# Patient Record
Sex: Female | Born: 1980 | Race: White | Hispanic: No | Marital: Single | Smoking: Never smoker
Health system: Southern US, Community
[De-identification: ages and names within clinical notes are randomized; demographics above are authoritative.]

## PROBLEM LIST (undated history)

## (undated) DIAGNOSIS — N926 Irregular menstruation, unspecified: Secondary | ICD-10-CM

## (undated) DIAGNOSIS — E1110 Type 2 diabetes mellitus with ketoacidosis without coma: Secondary | ICD-10-CM

## (undated) DIAGNOSIS — E109 Type 1 diabetes mellitus without complications: Secondary | ICD-10-CM

## (undated) DIAGNOSIS — E039 Hypothyroidism, unspecified: Secondary | ICD-10-CM

## (undated) DIAGNOSIS — G2581 Restless legs syndrome: Secondary | ICD-10-CM

## (undated) DIAGNOSIS — F419 Anxiety disorder, unspecified: Secondary | ICD-10-CM

## (undated) DIAGNOSIS — E1140 Type 2 diabetes mellitus with diabetic neuropathy, unspecified: Secondary | ICD-10-CM

## (undated) DIAGNOSIS — Z87898 Personal history of other specified conditions: Secondary | ICD-10-CM

## (undated) DIAGNOSIS — E271 Primary adrenocortical insufficiency: Secondary | ICD-10-CM

## (undated) DIAGNOSIS — R2232 Localized swelling, mass and lump, left upper limb: Secondary | ICD-10-CM

## (undated) DIAGNOSIS — R12 Heartburn: Secondary | ICD-10-CM

## (undated) HISTORY — PX: WISDOM TOOTH EXTRACTION: SHX21

---

## 1999-08-20 ENCOUNTER — Inpatient Hospital Stay (HOSPITAL_COMMUNITY): Admission: EM | Admit: 1999-08-20 | Discharge: 1999-08-22 | Payer: Self-pay | Admitting: Emergency Medicine

## 1999-09-06 ENCOUNTER — Encounter: Admission: RE | Admit: 1999-09-06 | Discharge: 1999-12-05 | Payer: Self-pay | Admitting: *Deleted

## 2002-04-21 ENCOUNTER — Encounter: Admission: RE | Admit: 2002-04-21 | Discharge: 2002-05-18 | Payer: Self-pay | Admitting: Internal Medicine

## 2002-11-24 ENCOUNTER — Inpatient Hospital Stay (HOSPITAL_COMMUNITY): Admission: EM | Admit: 2002-11-24 | Discharge: 2002-11-27 | Payer: Self-pay | Admitting: Emergency Medicine

## 2002-12-01 ENCOUNTER — Encounter: Admission: RE | Admit: 2002-12-01 | Discharge: 2002-12-01 | Payer: Self-pay | Admitting: Family Medicine

## 2003-01-17 ENCOUNTER — Encounter: Admission: RE | Admit: 2003-01-17 | Discharge: 2003-04-17 | Payer: Self-pay | Admitting: Internal Medicine

## 2003-11-18 ENCOUNTER — Other Ambulatory Visit: Admission: RE | Admit: 2003-11-18 | Discharge: 2003-11-18 | Payer: Self-pay | Admitting: Obstetrics and Gynecology

## 2005-04-01 ENCOUNTER — Emergency Department (HOSPITAL_COMMUNITY): Admission: EM | Admit: 2005-04-01 | Discharge: 2005-04-01 | Payer: Self-pay | Admitting: Emergency Medicine

## 2005-09-23 ENCOUNTER — Inpatient Hospital Stay (HOSPITAL_COMMUNITY): Admission: EM | Admit: 2005-09-23 | Discharge: 2005-09-26 | Payer: Self-pay | Admitting: Emergency Medicine

## 2005-11-04 ENCOUNTER — Other Ambulatory Visit: Admission: RE | Admit: 2005-11-04 | Discharge: 2005-11-04 | Payer: Self-pay | Admitting: Obstetrics and Gynecology

## 2005-11-20 ENCOUNTER — Observation Stay (HOSPITAL_COMMUNITY): Admission: EM | Admit: 2005-11-20 | Discharge: 2005-11-23 | Payer: Self-pay | Admitting: Emergency Medicine

## 2005-11-20 ENCOUNTER — Ambulatory Visit: Payer: Self-pay | Admitting: Pulmonary Disease

## 2005-12-24 ENCOUNTER — Encounter: Admission: RE | Admit: 2005-12-24 | Discharge: 2005-12-24 | Payer: Self-pay | Admitting: Family Medicine

## 2006-05-18 ENCOUNTER — Inpatient Hospital Stay (HOSPITAL_COMMUNITY): Admission: EM | Admit: 2006-05-18 | Discharge: 2006-05-20 | Payer: Self-pay | Admitting: Emergency Medicine

## 2006-09-05 ENCOUNTER — Inpatient Hospital Stay (HOSPITAL_COMMUNITY): Admission: EM | Admit: 2006-09-05 | Discharge: 2006-09-08 | Payer: Self-pay | Admitting: Emergency Medicine

## 2007-02-04 ENCOUNTER — Other Ambulatory Visit: Admission: RE | Admit: 2007-02-04 | Discharge: 2007-02-04 | Payer: Self-pay | Admitting: Obstetrics and Gynecology

## 2007-09-28 ENCOUNTER — Emergency Department (HOSPITAL_COMMUNITY): Admission: EM | Admit: 2007-09-28 | Discharge: 2007-09-28 | Payer: Self-pay | Admitting: Emergency Medicine

## 2008-12-14 ENCOUNTER — Emergency Department (HOSPITAL_COMMUNITY): Admission: EM | Admit: 2008-12-14 | Discharge: 2008-12-14 | Payer: Self-pay | Admitting: Emergency Medicine

## 2009-02-14 ENCOUNTER — Other Ambulatory Visit: Admission: RE | Admit: 2009-02-14 | Discharge: 2009-02-14 | Payer: Self-pay | Admitting: Obstetrics and Gynecology

## 2010-01-02 ENCOUNTER — Inpatient Hospital Stay (HOSPITAL_COMMUNITY): Admission: EM | Admit: 2010-01-02 | Discharge: 2010-01-04 | Payer: Self-pay | Admitting: Emergency Medicine

## 2010-01-27 ENCOUNTER — Inpatient Hospital Stay (HOSPITAL_COMMUNITY): Admission: EM | Admit: 2010-01-27 | Discharge: 2010-01-28 | Payer: Self-pay | Admitting: Emergency Medicine

## 2010-06-04 ENCOUNTER — Inpatient Hospital Stay (HOSPITAL_COMMUNITY): Admission: EM | Admit: 2010-06-04 | Discharge: 2010-06-06 | Payer: Self-pay | Admitting: Emergency Medicine

## 2010-10-16 LAB — BASIC METABOLIC PANEL
BUN: 9 mg/dL (ref 6–23)
CO2: 15 mEq/L — ABNORMAL LOW (ref 19–32)
CO2: 17 mEq/L — ABNORMAL LOW (ref 19–32)
CO2: 19 mEq/L (ref 19–32)
Calcium: 8.3 mg/dL — ABNORMAL LOW (ref 8.4–10.5)
Calcium: 8.5 mg/dL (ref 8.4–10.5)
Chloride: 105 mEq/L (ref 96–112)
Chloride: 106 mEq/L (ref 96–112)
Chloride: 109 mEq/L (ref 96–112)
Chloride: 110 mEq/L (ref 96–112)
Chloride: 112 mEq/L (ref 96–112)
Chloride: 112 mEq/L (ref 96–112)
Creatinine, Ser: 0.49 mg/dL (ref 0.4–1.2)
Creatinine, Ser: 0.51 mg/dL (ref 0.4–1.2)
Creatinine, Ser: 0.55 mg/dL (ref 0.4–1.2)
Creatinine, Ser: 0.64 mg/dL (ref 0.4–1.2)
GFR calc Af Amer: >60 mL/min (ref >60)
GFR calc Af Amer: >60 mL/min (ref >60)
GFR calc Af Amer: >60 mL/min (ref >60)
GFR calc Af Amer: >60 mL/min (ref >60)
GFR calc non Af Amer: >60 mL/min (ref >60)
GFR calc non Af Amer: >60 mL/min (ref >60)
GFR calc non Af Amer: >60 mL/min (ref >60)
GFR calc non Af Amer: >60 mL/min (ref >60)
Glucose, Bld: 141 mg/dL — ABNORMAL HIGH (ref 70–99)
Glucose, Bld: 277 mg/dL — ABNORMAL HIGH (ref 70–99)
Potassium: 3.2 mEq/L — ABNORMAL LOW (ref 3.5–5.1)
Potassium: 3.6 mEq/L (ref 3.5–5.1)
Sodium: 132 mEq/L — ABNORMAL LOW (ref 135–145)
Sodium: 135 mEq/L (ref 135–145)
Sodium: 136 mEq/L (ref 135–145)
Sodium: 136 mEq/L (ref 135–145)

## 2010-10-16 LAB — CBC
MCH: 32.4 pg (ref 26.0–34.0)
MCHC: 35 g/dL (ref 30.0–36.0)
MCV: 92.4 fL (ref 78.0–100.0)
Platelets: 283 10*3/uL (ref 150–400)
RDW: 12.3 % (ref 11.5–15.5)
WBC: 5.8 10*3/uL (ref 4.0–10.5)

## 2010-10-16 LAB — GLUCOSE, CAPILLARY
Glucose-Capillary: 130 mg/dL — ABNORMAL HIGH (ref 70–99)
Glucose-Capillary: 165 mg/dL — ABNORMAL HIGH (ref 70–99)
Glucose-Capillary: 189 mg/dL — ABNORMAL HIGH (ref 70–99)
Glucose-Capillary: 194 mg/dL — ABNORMAL HIGH (ref 70–99)
Glucose-Capillary: 227 mg/dL — ABNORMAL HIGH (ref 70–99)
Glucose-Capillary: 233 mg/dL — ABNORMAL HIGH (ref 70–99)
Glucose-Capillary: 236 mg/dL — ABNORMAL HIGH (ref 70–99)
Glucose-Capillary: 241 mg/dL — ABNORMAL HIGH (ref 70–99)
Glucose-Capillary: 370 mg/dL — ABNORMAL HIGH (ref 70–99)
Glucose-Capillary: 384 mg/dL — ABNORMAL HIGH (ref 70–99)
Glucose-Capillary: 428 mg/dL — ABNORMAL HIGH (ref 70–99)

## 2010-10-17 LAB — GLUCOSE, CAPILLARY
Glucose-Capillary: 140 mg/dL — ABNORMAL HIGH (ref 70–99)
Glucose-Capillary: 204 mg/dL — ABNORMAL HIGH (ref 70–99)
Glucose-Capillary: 215 mg/dL — ABNORMAL HIGH (ref 70–99)
Glucose-Capillary: 233 mg/dL — ABNORMAL HIGH (ref 70–99)
Glucose-Capillary: 233 mg/dL — ABNORMAL HIGH (ref 70–99)
Glucose-Capillary: 244 mg/dL — ABNORMAL HIGH (ref 70–99)

## 2010-10-17 LAB — BASIC METABOLIC PANEL
BUN: 13 mg/dL (ref 6–23)
CO2: 10 mEq/L — ABNORMAL LOW (ref 19–32)
CO2: 12 mEq/L — ABNORMAL LOW (ref 19–32)
CO2: 14 mEq/L — ABNORMAL LOW (ref 19–32)
Calcium: 8.6 mg/dL (ref 8.4–10.5)
Calcium: 9.6 mg/dL (ref 8.4–10.5)
Chloride: 108 mEq/L (ref 96–112)
Chloride: 110 mEq/L (ref 96–112)
Chloride: 110 mEq/L (ref 96–112)
Creatinine, Ser: 0.61 mg/dL (ref 0.4–1.2)
Creatinine, Ser: 0.92 mg/dL (ref 0.4–1.2)
GFR calc Af Amer: >60 mL/min (ref >60)
GFR calc Af Amer: >60 mL/min (ref >60)
GFR calc Af Amer: >60 mL/min (ref >60)
GFR calc Af Amer: >60 mL/min (ref >60)
GFR calc non Af Amer: >60 mL/min (ref >60)
Glucose, Bld: 136 mg/dL — ABNORMAL HIGH (ref 70–99)
Glucose, Bld: 240 mg/dL — ABNORMAL HIGH (ref 70–99)
Potassium: 4.6 mEq/L (ref 3.5–5.1)
Potassium: 4.8 mEq/L (ref 3.5–5.1)
Potassium: 4.8 mEq/L (ref 3.5–5.1)
Sodium: 132 mEq/L — ABNORMAL LOW (ref 135–145)
Sodium: 133 mEq/L — ABNORMAL LOW (ref 135–145)
Sodium: 137 mEq/L (ref 135–145)

## 2010-10-17 LAB — URINALYSIS, ROUTINE W REFLEX MICROSCOPIC
Glucose, UA: >1000 mg/dL — AB
Ketones, ur: >80 mg/dL — AB
Protein, ur: 30 mg/dL — AB

## 2010-10-17 LAB — CBC
HCT: 35.3 % — ABNORMAL LOW (ref 36.0–46.0)
Hemoglobin: 12.2 g/dL (ref 12.0–15.0)
Hemoglobin: 14.4 g/dL (ref 12.0–15.0)
MCH: 31.9 pg (ref 26.0–34.0)
MCH: 32 pg (ref 26.0–34.0)
MCHC: 34.5 g/dL (ref 30.0–36.0)
RBC: 3.81 MIL/uL — ABNORMAL LOW (ref 3.87–5.11)
RBC: 4.49 MIL/uL (ref 3.87–5.11)
WBC: 15.1 10*3/uL — ABNORMAL HIGH (ref 4.0–10.5)

## 2010-10-17 LAB — BLOOD GAS, ARTERIAL
Bicarbonate: 10.7 mEq/L — ABNORMAL LOW (ref 20.0–24.0)
Drawn by: 23604
FIO2: 0.21 %
O2 Saturation: 98.7 %
pCO2 arterial: 23.3 mmHg — ABNORMAL LOW (ref 35.0–45.0)
pH, Arterial: 7.284 — ABNORMAL LOW (ref 7.350–7.400)
pO2, Arterial: 115 mmHg — ABNORMAL HIGH (ref 80.0–100.0)

## 2010-10-17 LAB — MAGNESIUM
Magnesium: 1.6 mg/dL (ref 1.5–2.5)
Magnesium: 1.7 mg/dL (ref 1.5–2.5)
Magnesium: 1.7 mg/dL (ref 1.5–2.5)
Magnesium: 1.8 mg/dL (ref 1.5–2.5)

## 2010-10-17 LAB — PHOSPHORUS
Phosphorus: 1.6 mg/dL — ABNORMAL LOW (ref 2.3–4.6)
Phosphorus: 2.1 mg/dL — ABNORMAL LOW (ref 2.3–4.6)

## 2010-10-17 LAB — URINE MICROSCOPIC-ADD ON

## 2010-10-17 LAB — DIFFERENTIAL
Eosinophils Absolute: 0.1 10*3/uL (ref 0.0–0.7)
Lymphocytes Relative: 17 % (ref 12–46)
Lymphs Abs: 2.6 10*3/uL (ref 0.7–4.0)
Monocytes Relative: 8 % (ref 3–12)
Neutrophils Relative %: 74 % (ref 43–77)

## 2010-10-17 LAB — LIPASE, BLOOD: Lipase: 24 U/L (ref 11–59)

## 2010-10-22 LAB — GLUCOSE, CAPILLARY
Glucose-Capillary: 101 mg/dL — ABNORMAL HIGH (ref 70–99)
Glucose-Capillary: 115 mg/dL — ABNORMAL HIGH (ref 70–99)
Glucose-Capillary: 119 mg/dL — ABNORMAL HIGH (ref 70–99)
Glucose-Capillary: 122 mg/dL — ABNORMAL HIGH (ref 70–99)
Glucose-Capillary: 132 mg/dL — ABNORMAL HIGH (ref 70–99)
Glucose-Capillary: 142 mg/dL — ABNORMAL HIGH (ref 70–99)
Glucose-Capillary: 143 mg/dL — ABNORMAL HIGH (ref 70–99)
Glucose-Capillary: 147 mg/dL — ABNORMAL HIGH (ref 70–99)
Glucose-Capillary: 152 mg/dL — ABNORMAL HIGH (ref 70–99)
Glucose-Capillary: 160 mg/dL — ABNORMAL HIGH (ref 70–99)
Glucose-Capillary: 167 mg/dL — ABNORMAL HIGH (ref 70–99)
Glucose-Capillary: 173 mg/dL — ABNORMAL HIGH (ref 70–99)
Glucose-Capillary: 177 mg/dL — ABNORMAL HIGH (ref 70–99)
Glucose-Capillary: 188 mg/dL — ABNORMAL HIGH (ref 70–99)
Glucose-Capillary: 203 mg/dL — ABNORMAL HIGH (ref 70–99)
Glucose-Capillary: 214 mg/dL — ABNORMAL HIGH (ref 70–99)
Glucose-Capillary: 226 mg/dL — ABNORMAL HIGH (ref 70–99)
Glucose-Capillary: 241 mg/dL — ABNORMAL HIGH (ref 70–99)
Glucose-Capillary: 245 mg/dL — ABNORMAL HIGH (ref 70–99)
Glucose-Capillary: 248 mg/dL — ABNORMAL HIGH (ref 70–99)
Glucose-Capillary: 263 mg/dL — ABNORMAL HIGH (ref 70–99)
Glucose-Capillary: 267 mg/dL — ABNORMAL HIGH (ref 70–99)
Glucose-Capillary: 268 mg/dL — ABNORMAL HIGH (ref 70–99)
Glucose-Capillary: 274 mg/dL — ABNORMAL HIGH (ref 70–99)
Glucose-Capillary: 285 mg/dL — ABNORMAL HIGH (ref 70–99)
Glucose-Capillary: 291 mg/dL — ABNORMAL HIGH (ref 70–99)
Glucose-Capillary: 321 mg/dL — ABNORMAL HIGH (ref 70–99)
Glucose-Capillary: 351 mg/dL — ABNORMAL HIGH (ref 70–99)
Glucose-Capillary: 362 mg/dL — ABNORMAL HIGH (ref 70–99)
Glucose-Capillary: 378 mg/dL — ABNORMAL HIGH (ref 70–99)
Glucose-Capillary: 391 mg/dL — ABNORMAL HIGH (ref 70–99)
Glucose-Capillary: 446 mg/dL — ABNORMAL HIGH (ref 70–99)
Glucose-Capillary: 450 mg/dL — ABNORMAL HIGH (ref 70–99)
Glucose-Capillary: 503 mg/dL — ABNORMAL HIGH (ref 70–99)
Glucose-Capillary: 548 mg/dL — ABNORMAL HIGH (ref 70–99)
Glucose-Capillary: 560 mg/dL (ref 70–99)
Glucose-Capillary: 598 mg/dL (ref 70–99)
Glucose-Capillary: 92 mg/dL (ref 70–99)

## 2010-10-22 LAB — CBC
HCT: 47.7 % — ABNORMAL HIGH (ref 36.0–46.0)
HCT: 49.9 % — ABNORMAL HIGH (ref 36.0–46.0)
Hemoglobin: 11.3 g/dL — ABNORMAL LOW (ref 12.0–15.0)
Hemoglobin: 16.4 g/dL — ABNORMAL HIGH (ref 12.0–15.0)
Hemoglobin: 16.9 g/dL — ABNORMAL HIGH (ref 12.0–15.0)
Hemoglobin: 17 g/dL — ABNORMAL HIGH (ref 12.0–15.0)
MCHC: 33.1 g/dL (ref 30.0–36.0)
MCHC: 33.1 g/dL (ref 30.0–36.0)
MCHC: 34.2 g/dL (ref 30.0–36.0)
MCHC: 34.3 g/dL (ref 30.0–36.0)
Platelets: 187 10*3/uL (ref 150–400)
RBC: 5.26 MIL/uL — ABNORMAL HIGH (ref 3.87–5.11)
RBC: 5.34 MIL/uL — ABNORMAL HIGH (ref 3.87–5.11)
RDW: 12.4 % (ref 11.5–15.5)
RDW: 12.6 % (ref 11.5–15.5)
WBC: 18.1 10*3/uL — ABNORMAL HIGH (ref 4.0–10.5)
WBC: 9.5 10*3/uL (ref 4.0–10.5)

## 2010-10-22 LAB — COMPREHENSIVE METABOLIC PANEL
ALT: 21 U/L (ref 0–35)
ALT: 25 U/L (ref 0–35)
AST: 30 U/L (ref 0–37)
AST: 30 U/L (ref 0–37)
Alkaline Phosphatase: 85 U/L (ref 39–117)
Alkaline Phosphatase: 96 U/L (ref 39–117)
CO2: 13 mEq/L — ABNORMAL LOW (ref 19–32)
CO2: 28 mEq/L (ref 19–32)
Calcium: 9.6 mg/dL (ref 8.4–10.5)
Chloride: 102 mEq/L (ref 96–112)
Chloride: 92 mEq/L — ABNORMAL LOW (ref 96–112)
GFR calc Af Amer: 40 mL/min — ABNORMAL LOW (ref >60)
GFR calc non Af Amer: 33 mL/min — ABNORMAL LOW (ref >60)
GFR calc non Af Amer: >60 mL/min (ref >60)
Glucose, Bld: 175 mg/dL — ABNORMAL HIGH (ref 70–99)
Glucose, Bld: 490 mg/dL — ABNORMAL HIGH (ref 70–99)
Potassium: 5.9 mEq/L — ABNORMAL HIGH (ref 3.5–5.1)
Sodium: 129 mEq/L — ABNORMAL LOW (ref 135–145)
Sodium: 137 mEq/L (ref 135–145)
Total Bilirubin: 0.9 mg/dL (ref 0.3–1.2)
Total Bilirubin: 1.4 mg/dL — ABNORMAL HIGH (ref 0.3–1.2)

## 2010-10-22 LAB — BASIC METABOLIC PANEL
BUN: 12 mg/dL (ref 6–23)
BUN: 12 mg/dL (ref 6–23)
BUN: 14 mg/dL (ref 6–23)
BUN: 17 mg/dL (ref 6–23)
BUN: 21 mg/dL (ref 6–23)
BUN: 29 mg/dL — ABNORMAL HIGH (ref 6–23)
BUN: 34 mg/dL — ABNORMAL HIGH (ref 6–23)
BUN: 9 mg/dL (ref 6–23)
BUN: 9 mg/dL (ref 6–23)
CO2: 18 mEq/L — ABNORMAL LOW (ref 19–32)
CO2: 20 mEq/L (ref 19–32)
CO2: 20 mEq/L (ref 19–32)
CO2: 20 mEq/L (ref 19–32)
CO2: 23 mEq/L (ref 19–32)
Calcium: 7.4 mg/dL — ABNORMAL LOW (ref 8.4–10.5)
Calcium: 7.7 mg/dL — ABNORMAL LOW (ref 8.4–10.5)
Calcium: 7.7 mg/dL — ABNORMAL LOW (ref 8.4–10.5)
Calcium: 8 mg/dL — ABNORMAL LOW (ref 8.4–10.5)
Calcium: 8.7 mg/dL (ref 8.4–10.5)
Calcium: 8.9 mg/dL (ref 8.4–10.5)
Chloride: 104 mEq/L (ref 96–112)
Chloride: 108 mEq/L (ref 96–112)
Chloride: 104 mEq/L (ref 96–112)
Chloride: 105 mEq/L (ref 96–112)
Chloride: 110 mEq/L (ref 96–112)
Creatinine, Ser: 0.46 mg/dL (ref 0.4–1.2)
Creatinine, Ser: 0.59 mg/dL (ref 0.4–1.2)
Creatinine, Ser: 0.78 mg/dL (ref 0.4–1.2)
Creatinine, Ser: 0.68 mg/dL (ref 0.4–1.2)
Creatinine, Ser: 0.78 mg/dL (ref 0.4–1.2)
Creatinine, Ser: 1.07 mg/dL (ref 0.4–1.2)
Creatinine, Ser: 1.3 mg/dL — ABNORMAL HIGH (ref 0.4–1.2)
GFR calc Af Amer: >60 mL/min (ref >60)
GFR calc Af Amer: >60 mL/min (ref >60)
GFR calc Af Amer: >60 mL/min (ref >60)
GFR calc non Af Amer: 48 mL/min — ABNORMAL LOW (ref >60)
GFR calc non Af Amer: >60 mL/min (ref >60)
GFR calc non Af Amer: >60 mL/min (ref >60)
GFR calc non Af Amer: >60 mL/min (ref >60)
GFR calc non Af Amer: >60 mL/min (ref >60)
Glucose, Bld: 132 mg/dL — ABNORMAL HIGH (ref 70–99)
Glucose, Bld: 186 mg/dL — ABNORMAL HIGH (ref 70–99)
Glucose, Bld: 197 mg/dL — ABNORMAL HIGH (ref 70–99)
Glucose, Bld: 284 mg/dL — ABNORMAL HIGH (ref 70–99)
Glucose, Bld: 96 mg/dL (ref 70–99)
Potassium: 3.5 mEq/L (ref 3.5–5.1)
Potassium: 3.8 mEq/L (ref 3.5–5.1)
Potassium: 4 mEq/L (ref 3.5–5.1)
Potassium: 4.8 mEq/L (ref 3.5–5.1)
Potassium: 5.1 mEq/L (ref 3.5–5.1)
Sodium: 132 mEq/L — ABNORMAL LOW (ref 135–145)
Sodium: 136 mEq/L (ref 135–145)
Sodium: 138 mEq/L (ref 135–145)

## 2010-10-22 LAB — DIFFERENTIAL
Basophils Absolute: 0 10*3/uL (ref 0.0–0.1)
Basophils Absolute: 0 10*3/uL (ref 0.0–0.1)
Basophils Absolute: 0.1 10*3/uL (ref 0.0–0.1)
Basophils Relative: 0 % (ref 0–1)
Basophils Relative: 0 % (ref 0–1)
Basophils Relative: 0 % (ref 0–1)
Eosinophils Absolute: 0.1 10*3/uL (ref 0.0–0.7)
Eosinophils Absolute: 0.1 10*3/uL (ref 0.0–0.7)
Eosinophils Relative: 1 % (ref 0–5)
Eosinophils Relative: 2 % (ref 0–5)
Lymphocytes Relative: 6 % — ABNORMAL LOW (ref 12–46)
Lymphs Abs: 2.3 10*3/uL (ref 0.7–4.0)
Lymphs Abs: 3.1 10*3/uL (ref 0.7–4.0)
Neutro Abs: 8.8 10*3/uL — ABNORMAL HIGH (ref 1.7–7.7)
Neutrophils Relative %: 65 % (ref 43–77)
Neutrophils Relative %: 74 % (ref 43–77)
Neutrophils Relative %: 93 % — ABNORMAL HIGH (ref 43–77)

## 2010-10-22 LAB — URINE MICROSCOPIC-ADD ON

## 2010-10-22 LAB — CORTISOL: Cortisol, Plasma: 261.4 ug/dL

## 2010-10-22 LAB — RAPID URINE DRUG SCREEN, HOSP PERFORMED
Amphetamines: NOT DETECTED
Barbiturates: NOT DETECTED
Benzodiazepines: NOT DETECTED
Opiates: NOT DETECTED

## 2010-10-22 LAB — PHOSPHORUS: Phosphorus: 2.5 mg/dL (ref 2.3–4.6)

## 2010-10-22 LAB — KETONES, URINE: Ketones, ur: NEGATIVE mg/dL

## 2010-10-22 LAB — URINALYSIS, ROUTINE W REFLEX MICROSCOPIC
Bilirubin Urine: NEGATIVE
Bilirubin Urine: NEGATIVE
Glucose, UA: >1000 mg/dL — AB
Glucose, UA: >1000 mg/dL — AB
Glucose, UA: >1000 mg/dL — AB
Ketones, ur: NEGATIVE mg/dL
Ketones, ur: NEGATIVE mg/dL
Leukocytes, UA: NEGATIVE
Leukocytes, UA: NEGATIVE
Specific Gravity, Urine: 1.025 (ref 1.005–1.030)
Urobilinogen, UA: 0.2 mg/dL (ref 0.0–1.0)
pH: 6 (ref 5.0–8.0)
pH: 6 (ref 5.0–8.0)

## 2010-10-22 LAB — URINE CULTURE

## 2010-10-22 LAB — MAGNESIUM: Magnesium: 1.8 mg/dL (ref 1.5–2.5)

## 2010-10-22 LAB — HEMOGLOBIN A1C: Mean Plasma Glucose: 280 mg/dL — ABNORMAL HIGH (ref <117)

## 2010-10-22 LAB — KETONES, QUALITATIVE: Acetone, Bld: NEGATIVE

## 2010-10-22 LAB — LIPASE, BLOOD: Lipase: 21 U/L (ref 11–59)

## 2010-10-22 LAB — POCT I-STAT, CHEM 8
Calcium, Ion: 1.15 mmol/L (ref 1.12–1.32)
Glucose, Bld: 132 mg/dL — ABNORMAL HIGH (ref 70–99)
HCT: 52 % — ABNORMAL HIGH (ref 36.0–46.0)
TCO2: 27 mmol/L (ref 0–100)

## 2010-10-22 LAB — LACTIC ACID, PLASMA: Lactic Acid, Venous: 3.9 mmol/L — ABNORMAL HIGH (ref 0.5–2.2)

## 2010-10-22 LAB — PREGNANCY, URINE: Preg Test, Ur: NEGATIVE

## 2010-11-13 LAB — COMPREHENSIVE METABOLIC PANEL
AST: 32 U/L (ref 0–37)
Albumin: 3.5 g/dL (ref 3.5–5.2)
BUN: 11 mg/dL (ref 6–23)
Calcium: 8.2 mg/dL — ABNORMAL LOW (ref 8.4–10.5)
Chloride: 106 mEq/L (ref 96–112)
Creatinine, Ser: 0.57 mg/dL (ref 0.4–1.2)
GFR calc Af Amer: >60 mL/min (ref >60)
Total Bilirubin: 1 mg/dL (ref 0.3–1.2)
Total Protein: 6.1 g/dL (ref 6.0–8.3)

## 2010-11-13 LAB — URINE CULTURE: Colony Count: >=100000

## 2010-11-13 LAB — GLUCOSE, CAPILLARY: Glucose-Capillary: 28 mg/dL — CL (ref 70–99)

## 2010-11-13 LAB — POCT I-STAT, CHEM 8
BUN: 12 mg/dL (ref 6–23)
Calcium, Ion: 0.95 mmol/L — ABNORMAL LOW (ref 1.12–1.32)
Chloride: 103 mEq/L (ref 96–112)
Glucose, Bld: 378 mg/dL — ABNORMAL HIGH (ref 70–99)
HCT: 49 % — ABNORMAL HIGH (ref 36.0–46.0)
TCO2: 17 mmol/L (ref 0–100)

## 2010-11-13 LAB — URINE MICROSCOPIC-ADD ON

## 2010-11-13 LAB — POCT CARDIAC MARKERS: CKMB, poc: <1 ng/mL — ABNORMAL LOW (ref 1.0–8.0)

## 2010-11-13 LAB — URINALYSIS, ROUTINE W REFLEX MICROSCOPIC
Glucose, UA: >1000 mg/dL — AB
Ketones, ur: 15 mg/dL — AB
pH: 6 (ref 5.0–8.0)

## 2010-11-13 LAB — MAGNESIUM: Magnesium: 1.9 mg/dL (ref 1.5–2.5)

## 2010-11-13 LAB — DIFFERENTIAL
Basophils Absolute: 0.2 10*3/uL — ABNORMAL HIGH (ref 0.0–0.1)
Lymphocytes Relative: 29 % (ref 12–46)
Lymphs Abs: 2.4 10*3/uL (ref 0.7–4.0)
Monocytes Absolute: 0.4 10*3/uL (ref 0.1–1.0)
Neutro Abs: 5 10*3/uL (ref 1.7–7.7)

## 2010-11-13 LAB — CBC
HCT: 46.2 % — ABNORMAL HIGH (ref 36.0–46.0)
MCHC: 33.9 g/dL (ref 30.0–36.0)
MCV: 92.7 fL (ref 78.0–100.0)
Platelets: 231 10*3/uL (ref 150–400)
RDW: 11.7 % (ref 11.5–15.5)

## 2010-11-13 LAB — LIPASE, BLOOD: Lipase: 17 U/L (ref 11–59)

## 2010-11-16 ENCOUNTER — Inpatient Hospital Stay (HOSPITAL_COMMUNITY)
Admission: EM | Admit: 2010-11-16 | Discharge: 2010-11-18 | DRG: 582 | Disposition: A | Discharge status: Home / Self Care | Payer: BLUE CROSS/BLUE SHIELD | Attending: Internal Medicine | Admitting: Internal Medicine

## 2010-11-16 DIAGNOSIS — E875 Hyperkalemia: Secondary | ICD-10-CM | POA: Diagnosis present

## 2010-11-16 DIAGNOSIS — E876 Hypokalemia: Secondary | ICD-10-CM | POA: Diagnosis not present

## 2010-11-16 DIAGNOSIS — T383X5A Adverse effect of insulin and oral hypoglycemic [antidiabetic] drugs, initial encounter: Secondary | ICD-10-CM | POA: Diagnosis present

## 2010-11-16 DIAGNOSIS — R17 Unspecified jaundice: Secondary | ICD-10-CM | POA: Diagnosis present

## 2010-11-16 DIAGNOSIS — Z794 Long term (current) use of insulin: Secondary | ICD-10-CM

## 2010-11-16 DIAGNOSIS — T85694A Other mechanical complication of insulin pump, initial encounter: Principal | ICD-10-CM | POA: Diagnosis present

## 2010-11-16 DIAGNOSIS — IMO0002 Reserved for concepts with insufficient information to code with codable children: Secondary | ICD-10-CM

## 2010-11-16 DIAGNOSIS — E871 Hypo-osmolality and hyponatremia: Secondary | ICD-10-CM | POA: Diagnosis present

## 2010-11-16 DIAGNOSIS — E1010 Type 1 diabetes mellitus with ketoacidosis without coma: Secondary | ICD-10-CM | POA: Diagnosis present

## 2010-11-16 DIAGNOSIS — D6489 Other specified anemias: Secondary | ICD-10-CM | POA: Diagnosis present

## 2010-11-16 DIAGNOSIS — E2749 Other adrenocortical insufficiency: Secondary | ICD-10-CM | POA: Diagnosis present

## 2010-11-16 DIAGNOSIS — D72829 Elevated white blood cell count, unspecified: Secondary | ICD-10-CM | POA: Diagnosis present

## 2010-11-16 DIAGNOSIS — N179 Acute kidney failure, unspecified: Secondary | ICD-10-CM | POA: Diagnosis present

## 2010-11-16 LAB — POCT I-STAT, CHEM 8
Creatinine, Ser: 0.9 mg/dL (ref 0.4–1.2)
HCT: 41 % (ref 36.0–46.0)
Hemoglobin: 13.9 g/dL (ref 12.0–15.0)
Potassium: 5.3 mEq/L — ABNORMAL HIGH (ref 3.5–5.1)
Sodium: 136 mEq/L (ref 135–145)

## 2010-11-16 LAB — CBC
HCT: 43.3 % (ref 36.0–46.0)
MCV: 89.5 fL (ref 78.0–100.0)
Platelets: 314 10*3/uL (ref 150–400)
RBC: 4.84 MIL/uL (ref 3.87–5.11)
WBC: 16.2 10*3/uL — ABNORMAL HIGH (ref 4.0–10.5)

## 2010-11-16 LAB — URINALYSIS, ROUTINE W REFLEX MICROSCOPIC
Bilirubin Urine: NEGATIVE
Leukocytes, UA: NEGATIVE
Nitrite: NEGATIVE
Specific Gravity, Urine: 1.026 (ref 1.005–1.030)
pH: 5.5 (ref 5.0–8.0)

## 2010-11-16 LAB — BASIC METABOLIC PANEL
CO2: 14 mEq/L — ABNORMAL LOW (ref 19–32)
Calcium: 8 mg/dL — ABNORMAL LOW (ref 8.4–10.5)
Chloride: 113 mEq/L — ABNORMAL HIGH (ref 96–112)
Creatinine, Ser: 1.05 mg/dL (ref 0.4–1.2)
Glucose, Bld: 220 mg/dL — ABNORMAL HIGH (ref 70–99)

## 2010-11-16 LAB — COMPREHENSIVE METABOLIC PANEL
ALT: 21 U/L (ref 0–35)
AST: 30 U/L (ref 0–37)
CO2: 10 mEq/L — CL (ref 19–32)
Calcium: 8.7 mg/dL (ref 8.4–10.5)
Chloride: 101 mEq/L (ref 96–112)
GFR calc Af Amer: 55 mL/min — ABNORMAL LOW (ref >60)
GFR calc non Af Amer: 45 mL/min — ABNORMAL LOW (ref >60)
Sodium: 131 mEq/L — ABNORMAL LOW (ref 135–145)

## 2010-11-16 LAB — BLOOD GAS, VENOUS
Bicarbonate: 9.4 mEq/L — ABNORMAL LOW (ref 20.0–24.0)
Patient temperature: 98.6
pH, Ven: 7.134 — CL (ref 7.250–7.300)
pO2, Ven: 31.3 mmHg (ref 30.0–45.0)

## 2010-11-16 LAB — DIFFERENTIAL
Eosinophils Absolute: 0.1 10*3/uL (ref 0.0–0.7)
Lymphocytes Relative: 24 % (ref 12–46)
Lymphs Abs: 3.9 10*3/uL (ref 0.7–4.0)
Neutro Abs: 10.6 10*3/uL — ABNORMAL HIGH (ref 1.7–7.7)
Neutrophils Relative %: 65 % (ref 43–77)

## 2010-11-16 LAB — GLUCOSE, CAPILLARY
Glucose-Capillary: 211 mg/dL — ABNORMAL HIGH (ref 70–99)
Glucose-Capillary: 176 mg/dL — ABNORMAL HIGH (ref 70–99)

## 2010-11-16 LAB — URINE MICROSCOPIC-ADD ON

## 2010-11-17 LAB — GLUCOSE, CAPILLARY
Glucose-Capillary: 139 mg/dL — ABNORMAL HIGH (ref 70–99)
Glucose-Capillary: 146 mg/dL — ABNORMAL HIGH (ref 70–99)
Glucose-Capillary: 234 mg/dL — ABNORMAL HIGH (ref 70–99)
Glucose-Capillary: 241 mg/dL — ABNORMAL HIGH (ref 70–99)
Glucose-Capillary: 252 mg/dL — ABNORMAL HIGH (ref 70–99)
Glucose-Capillary: 254 mg/dL — ABNORMAL HIGH (ref 70–99)

## 2010-11-17 LAB — BASIC METABOLIC PANEL
CO2: 17 mEq/L — ABNORMAL LOW (ref 19–32)
CO2: 17 mEq/L — ABNORMAL LOW (ref 19–32)
Calcium: 7.8 mg/dL — ABNORMAL LOW (ref 8.4–10.5)
Calcium: 7.7 mg/dL — ABNORMAL LOW (ref 8.4–10.5)
Chloride: 113 mEq/L — ABNORMAL HIGH (ref 96–112)
Chloride: 111 mEq/L (ref 96–112)
Creatinine, Ser: 0.81 mg/dL (ref 0.4–1.2)
Creatinine, Ser: 0.61 mg/dL (ref 0.4–1.2)
Glucose, Bld: 239 mg/dL — ABNORMAL HIGH (ref 70–99)
Glucose, Bld: 144 mg/dL — ABNORMAL HIGH (ref 70–99)
Sodium: 134 mEq/L — ABNORMAL LOW (ref 135–145)

## 2010-11-18 LAB — BASIC METABOLIC PANEL
BUN: 4 mg/dL — ABNORMAL LOW (ref 6–23)
GFR calc Af Amer: >60 mL/min (ref >60)
GFR calc non Af Amer: >60 mL/min (ref >60)
Potassium: 3 mEq/L — ABNORMAL LOW (ref 3.5–5.1)
Sodium: 136 mEq/L (ref 135–145)

## 2010-11-18 LAB — BILIRUBIN, FRACTIONATED(TOT/DIR/INDIR)
Bilirubin, Direct: 0.1 mg/dL (ref 0.0–0.3)
Indirect Bilirubin: 0.7 mg/dL (ref 0.3–0.9)
Total Bilirubin: 0.8 mg/dL (ref 0.3–1.2)

## 2010-11-18 LAB — CBC
Platelets: 228 10*3/uL (ref 150–400)
RBC: 3.78 MIL/uL — ABNORMAL LOW (ref 3.87–5.11)
RDW: 12.7 % (ref 11.5–15.5)
WBC: 8 10*3/uL (ref 4.0–10.5)

## 2010-11-18 LAB — GLUCOSE, CAPILLARY
Glucose-Capillary: 244 mg/dL — ABNORMAL HIGH (ref 70–99)
Glucose-Capillary: 201 mg/dL — ABNORMAL HIGH (ref 70–99)
Glucose-Capillary: 156 mg/dL — ABNORMAL HIGH (ref 70–99)

## 2010-11-19 LAB — GLUCOSE, CAPILLARY
Glucose-Capillary: 186 mg/dL — ABNORMAL HIGH (ref 70–99)
Glucose-Capillary: 173 mg/dL — ABNORMAL HIGH (ref 70–99)

## 2010-11-19 NOTE — Discharge Summary (Signed)
NAMELAELYN, BLUMENTHAL                 ACCOUNT NO.:  [id number]  MEDICAL RECORD NO.:  [id number]           PATIENT TYPE:  I  LOCATION:  1338                         FACILITY:  [facility]  PHYSICIAN:  Hillery Aldo, M.D.   DATE OF BIRTH:  [birth year]  DATE OF ADMISSION:  11/16/2010 DATE OF DISCHARGE:  11/18/2010                              DISCHARGE SUMMARY   PRIMARY CARE PHYSICIAN:  Dr. Uvaldo Rising at [place].  ENDOCRINOLOGIST:  Dr. Allena Katz at [facility].  DISCHARGE DIAGNOSES: 1. Diabetic ketoacidosis secondary to pump failure. 2. Type 1 diabetes, poor control. 3. History of Addison disease. 4. Leukocytosis, resolved. 5. Hyperbilirubinemia, resolved. 6. Hyponatremia, resolved. 7. Hypo/hyperkalemia. 8. Acute renal failure secondary to dehydration and osmotic diuresis. 9. Mild normocytic anemia in a menstruating female.  DISCHARGE MEDICATIONS: 1. Insulin pump with a basal rate of 0.6 units per hour from 12 a.m.     to 3 a.m.; 0.65 units per hour from 3 a.m. to 9 a.m.; 0.7 units per     hour from 9 a.m. to midnight; bolus 1 unit for every 11 g of     carbohydrates with a correction factor of 1 unit for every 50 mg/dL     of glucose over 045. 2. Fludrocortisone 0.1 mg, one-half tablet p.o. daily. 3. Hydrocortisone 10 mg, 1.5 tablets q.a.m. and 1 tablet q.p.m. 4. Ropinirole 0.25 mg p.o. nightly. 5. Zoloft 75 mg p.o. daily.  CONSULTATIONS:  Diabetes coordinator.  BRIEF ADMISSION HPI:  The patient is a 30 year old female with past medical history of type 1 diabetes who is on an insulin pump.  The patient's pump apparently was not correctly primed and she subsequently developed altered mental status and did not show up for  work.  Her coworkers came to the house and found her not looking well and called 911.  The patient's initial laboratory values were consistent with acute acidosis, acute renal failure, and findings consistent with diabetic ketoacidosis.  For the full details, please see  the dictated report done by Dr. Onalee Hua.  PROCEDURES AND DIAGNOSTIC STUDIES:  None.  DISCHARGE LABORATORY VALUES:  Sodium was 136, potassium 3.0, chloride 110, bicarb 19, BUN 4, creatinine 0.48, glucose 191, calcium 7.9.  White blood cell count was 8, hemoglobin 11.6, hematocrit 32.6, platelets 228. Total bilirubin had corrected to 0.8.  TSH was 1.839.  Urine pregnancy testing was negative.  HOSPITAL COURSE BY PROBLEMS: 1. Diabetic ketoacidosis:  This appears to be precipitated by pump     malfunction.  The patient was admitted and put on IV insulin     therapy per the [facility] protocol.  Once she met criteria for     discontinuation of IV insulin, her insulin pump was reinitiated     with an hour overlap therapy.  The patient's anion gap is closed     and she is no longer acidotic. 2. Poorly controlled type 1 diabetes:  The patient was seen and     evaluated by the diabetes coordinator, who states the patient is     very fearful of hypoglycemia and her endocrinologist has allowed     her  to allow her sugars to run higher because of this fear.  She     confirmed the pump settings and these were resumed once the patient     was out of DKA.  She has a followup appointment with her     endocrinologist at [facility] tomorrow and therefore, we will not     make any further adjustments to her insulin pump settings. 3. History of Addison disease:  The patient was initially put on     stress dose steroids, but she did not have any evidence of adrenal     crisis and was put back on her maintenance dose steroids. 4. Leukocytosis:  The patient's leukocytosis was probably from     demargination from elevated steroid dosing.  Her leukocytosis was     completely resolved within 24 hours and she had no evidence of     infection. 5. Hyperbilirubinemia:  The patient's initial bilirubin was elevated     at 1.9.  Fractionated bilirubin was sent and this came back normal. 6. Hyponatremia:  This  is likely due to her elevated blood glucoses.     It is now resolved. 7. Hypo/hyperkalemia:  The patient was initially hyperkalemic     secondary to diabetic ketoacidosis.  She was fluid volume     resuscitated and her potassium dropped to 3 mg/dL.  She is being     repleted with 60 mEq once of oral potassium prior to discharge. 8. Acute renal failure secondary to dehydration/osmotic diuresis:  The     patient was fluid volume resuscitated and her creatinine is now     normal. 9. Mild normocytic anemia:  No further workup necessary in this     menstruating female.  DISPOSITION:  The patient is medically stable and will be discharged to home with appropriate followup with her endocrinologist tomorrow.  CONDITION ON DISCHARGE:  Improved.  DISCHARGE DIET:  Carbohydrate modified, moderate.  Time spent coordinating care for discharge and discharge instructions including face-to-face time is approximately 35 minutes.     Hillery Aldo, M.D.     CR/MEDQ  D:  11/18/2010  T:  11/18/2010  Job:  [id number]  cc:   Dr. Oswaldo Milian  Dr. Smiley Houseman  Electronically Signed by Hillery Aldo M.D. on 11/19/2010 07:04:38 AM

## 2010-11-26 NOTE — H&P (Signed)
[name], [name]                 ACCOUNT NO.:  [id number]  MEDICAL RECORD NO.:  [id number]           PATIENT TYPE:  E  LOCATION:  WLED                         FACILITY:  [facility]  PHYSICIAN:  Mariea Stable, MD   DATE OF BIRTH:  May 31, 1981  DATE OF ADMISSION:  11/16/2010 DATE OF DISCHARGE:                             HISTORY & PHYSICAL   PRIMARY CARE PHYSICIAN:  Dr. Uvaldo Rising from [place].  ENDOCRINOLOGIST:  Dr. Allena Katz at [facility].  CHIEF COMPLAINT:  DKA.  HISTORY OF PRESENT ILLNESS:  [name] is a 30 year old female with past medical history significant for type 1 diabetes on an insulin pump, prior DKA episodes, and Addison disease who presents with a chief complaint of DKA.  The history currently was provided by her mother [name]) who is at bedside, as patient is currently somnolent secondary to medication received and DKA.  Apparently, the patient today did not go to work and her coworkers became concerned.  They tried contacting her multiple times, but were not able to.  They went to her house and found her to "not looking well" and called 911.  The patient was brought to the emergency department and initial workup demonstrated findings consistent with DKA.  At that time, triad hospitalist were called to admit the patient.  Of note, the patient's insulin pump was interrogated, it had a message, has had not primed and unable to give dose.  Furthermore upon reviewing the last 24 hours worth of insulin administration, it was much less than her settings.  PAST MEDICAL HISTORY: 1. Type 1 diabetes, on insulin pump. 2. Prior DKA. 3. Addison disease.  ALLERGIES:  No known drug allergies.  MEDICATIONS: 1. Insulin pump. 2. Hydrocortisone. 3. Florinef.  MEDICATIONS:  Dosages are currently unknown.  SOCIAL HISTORY:  The patient lives alone, she has no tobacco, alcohol or drug use.  The patient's mother who is currently at bedside, [name], lives in [place]  and is the one to be contacted in case of any emergency, her cell phone number [phone].  FAMILY HISTORY:  The patient has 2 siblings with diabetes.  PHYSICAL EXAMINATION:  VITAL SIGNS:  Temperature of 98.5, blood pressure 100/65, heart rate of 113, respirations 16, oxygen saturation 100% on room air. GENERAL:  This is a young woman who is lying in bed currently somnolent, though arousable with noxious stimuli and able to follow simple commands. HEENT:  Head is normocephalic, atraumatic.  Pupils are equally round and reactive to light.  Extraocular movements appear intact.  Sclerae anicteric.  Mucous membranes are markedly dry.  There are no oropharyngeal lesions. NECK:  Supple.  There is no thyromegaly.  There is no JVD.  There is no carotid bruits. HEART: There is a normal S1 and S2 with tachycardic rate, with regular rhythm.  There are no murmurs, gallops or rubs. LUNGS:  Clear to auscultation bilaterally. ABDOMEN:  Positive bowel sounds, soft, nontender, nondistended.  There is no guarding. EXTREMITIES: There is no cyanosis, clubbing, or edema. NEUROLOGIC:  The patient is somnolent, but arousable to noxious stimuli. She follows simple commands.  She is moving  all extremities.  Sensation appears grossly intact.  LABORATORY DATA:  WBC 16.2, hemoglobin 15.5, platelets 314, venous gas with a pH of 7.134 (again this is venous).  Sodium 131, potassium 6.3, chloride 101, bicarb 10, glucose 433, BUN 15, creatinine 1.36, total bilirubin 1.9, otherwise LFTs are within normal limits.  Lipase 22. Urine pregnancy test is negative.  Urinalysis shows greater than 1000, glucose, greater than 80 ketones, otherwise negative.  ASSESSMENT/PLAN: 1. Diabetic ketoacidosis.  Given the current data, it is most likely     etiology is lack of insulin in insulin pump reported not being     primed and being unable to give the current dose.  Upon review, the     insulin administration over the last  24 hours was markedly less     than that in its setting.  At this point, we will unhook the     insulin pump and will admit to step-down unit.  We will hydrate     aggressively with normal saline and put on an insulin drip.  We     will go ahead and monitor metabolic panels until the anion gap is     closed and bicarb is above 20.  Once this happens, we will go ahead     and transition basal and bolus per insulin pump.  Of note, the     patient has an appointment on Monday at [facility] with     endocrinologist. 2. Addison disease.  Currently, the dose of hydrocortisone at home is     unknown.  We will currently hold off on the Florinef and treat     empirically with a stress dose steroids specifically hydrocortisone     100 mg IV q.8 h. given her history of Addison disease in the     current DKA.  Once dosages are confirmed, we will go ahead and     taper down to home dose of hydrocortisone and Florinef. 3. Leukocytosis.  This is likely secondary to demargination from DKA. 4. Erythrocytosis likely secondary to volume contraction. 5. Acute renal insufficiency.  This is mild and most likely secondary     to volume depletion. 6. Hyponatremia.  This is secondary to volume depletion.  We will     monitor metabolic panels. 7. Hyperkalemia.  This is secondary to DKA and will resolve with     insulin and fluids.  We will monitor metabolic panels. 8. Hyperbilirubinemia.  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  CODE STATUS:  The patient is a full code.  The patient's mother is at bedside and is to be contacted in case of emergency, cell phone number is [phone].     Mariea Stable, MD     MA/MEDQ  D:  11/16/2010  T:  11/16/2010  Job:  [id number]  cc:   Dr. Uvaldo Rising  Dr. Allena Katz  Electronically Signed by Mariea Stable MD on 11/26/2010 10:31:12 AM

## 2010-12-07 ENCOUNTER — Emergency Department (INDEPENDENT_AMBULATORY_CARE_PROVIDER_SITE_OTHER): Payer: BC Managed Care – PPO

## 2010-12-07 ENCOUNTER — Emergency Department (HOSPITAL_BASED_OUTPATIENT_CLINIC_OR_DEPARTMENT_OTHER)
Admission: EM | Admit: 2010-12-07 | Discharge: 2010-12-07 | Disposition: A | Discharge status: Home / Self Care | Payer: BC Managed Care – PPO | Attending: Emergency Medicine | Admitting: Emergency Medicine

## 2010-12-07 DIAGNOSIS — E162 Hypoglycemia, unspecified: Secondary | ICD-10-CM

## 2010-12-07 DIAGNOSIS — R404 Transient alteration of awareness: Secondary | ICD-10-CM

## 2010-12-07 DIAGNOSIS — Z043 Encounter for examination and observation following other accident: Secondary | ICD-10-CM

## 2010-12-07 DIAGNOSIS — E119 Type 2 diabetes mellitus without complications: Secondary | ICD-10-CM

## 2010-12-07 DIAGNOSIS — E1169 Type 2 diabetes mellitus with other specified complication: Secondary | ICD-10-CM | POA: Insufficient documentation

## 2010-12-07 LAB — BASIC METABOLIC PANEL
Calcium: 9.1 mg/dL (ref 8.4–10.5)
Glucose, Bld: 417 mg/dL — ABNORMAL HIGH (ref 70–99)
Sodium: 128 mEq/L — ABNORMAL LOW (ref 135–145)

## 2010-12-07 LAB — CBC
MCV: 88.2 fL (ref 78.0–100.0)
Platelets: 308 10*3/uL (ref 150–400)
RBC: 4.91 MIL/uL (ref 3.87–5.11)
WBC: 7 10*3/uL (ref 4.0–10.5)

## 2010-12-07 LAB — DIFFERENTIAL
Eosinophils Absolute: 0.3 10*3/uL (ref 0.0–0.7)
Lymphs Abs: 2.4 10*3/uL (ref 0.7–4.0)
Neutro Abs: 3.5 10*3/uL (ref 1.7–7.7)
Neutrophils Relative %: 51 % (ref 43–77)

## 2010-12-07 LAB — URINALYSIS, ROUTINE W REFLEX MICROSCOPIC
Hgb urine dipstick: NEGATIVE
Nitrite: NEGATIVE
Specific Gravity, Urine: 1.017 (ref 1.005–1.030)
Urobilinogen, UA: 0.2 mg/dL (ref 0.0–1.0)

## 2010-12-07 LAB — URINE MICROSCOPIC-ADD ON

## 2010-12-07 LAB — PREGNANCY, URINE: Preg Test, Ur: NEGATIVE

## 2010-12-12 LAB — GLUCOSE, CAPILLARY: Glucose-Capillary: 327 mg/dL — ABNORMAL HIGH (ref 70–99)

## 2010-12-21 NOTE — Discharge Summary (Signed)
[name], [name]                 ACCOUNT NO.:  [id number]   MEDICAL RECORD NO.:  [id number]          PATIENT TYPE:  INP   LOCATION:  3712                         FACILITY:  [facility]   PHYSICIAN:  Melissa L. Ladona Ridgel, MD  DATE OF BIRTH:  1980/10/16   DATE OF ADMISSION:  11/20/2005  DATE OF DISCHARGE:  11/23/2005                                 DISCHARGE SUMMARY   CHIEF COMPLAINT:  Altered mental status and diabetic ketoacidosis.   DISCHARGE DIAGNOSES:  1.  Diabetic ketoacidosis.  The patient arrived at the hospital and was      found to be severely hydrated and confused.  She was admitted by      pulmonary critical care team to the intensive care unit for emergent      intravenous fluid repletion and treatment of diabetic ketoacidosis.  By      the time of discharge, the patient's blood sugars have returned to      manageable level.  2.  Electrolyte imbalance secondary to #1.  The patient will have repletion      of potassium IV and p.o. prior to discharge.  She will also get      repletion of phosphorus prior to discharge.  I will ask her to please      follow up with Dr. Uvaldo Rising on Monday, Tuesday, or Wednesday to have labs      redrawn.  3.  Confusion secondary to #1 and #2.  This has resolved.  The patient,      however, is tearful, angry, and anxious.  We discussed at length the      fact that she probably would benefit from some counseling.  I provided      her with chat place with American Diabetes Association web site.  The      patient's frustration stems from being a young woman with type 1      diabetes and no connection with any identifiable group of people that      are similar to her.  She was very angry about being ill and has no way      to express that.  I recommended getting involved with the American      Diabetes Association and some form of counseling.  4.  Depression. The patient will continue with Zoloft 75 mg once daily.  5.  Diabetes. The patient will continue  with NPH and sliding scale insulin,      Humalog 1 unit per 15 g carbohydrates.  6.  Upper respiratory symptoms, seasonal allergies, and bronchitis.  I have      recommended to the patient that she might want to go for a pulmonary      consult to see if we can maximize her treatments as this does impact on      her diabetes.  The patient at home uses Atrovent and Qvar p.r.n. I am      recommending a standing dose and followup with her primary care      physician for recommendations for pulmonary consult.  I have also  restarted her on Flonase 2 sprays daily.  She will resume Zyrtec or a      comparable anti-allergy medication.  7.  Hypophosphatemia.  This has been repleted in the hospital, and I will      not send her home on any medication but will ask that she have labs      drawn next week and be evaluated.   DISCHARGE MEDICATIONS:  1.  Zoloft 75 mg once daily.  2.  NPH 20 units subcutaneously a.m. and p.m.  3.  Sliding scale Humalog 1 unit per 15 g carbohydrates.  4.  Zyrtec 10 mg p.m.  5.  Flonase 2 sprays daily.  6.  Atrovent 2 puffs 4 times daily.  7.  Qvar 1 puff twice daily.  These have been my recommendations for      pulmonary treatment if she is going to use the medications she has,      although I would recommend that she follow up and get a pulmonary      consult as an outpatient.  8.  Potassium 20 mEq once daily.   I recommended that she follow up with Dr. Uvaldo Rising Monday,  Tuesday, or  Wednesday of next week.   HISTORY OF PRESENT ILLNESS:  The patient is a 30 year old white female who  states that she developed nausea and vomiting related to something she had  eaten.  Subsequently she waited to call anybody to tell them she was sick.  She finally called her mother to state that she was not feeling well.  When  her mother arrived, the patient was found to be confused with slurred  speech.  EMS were called who found her to be vomiting and dry heaving with a  blood  glucose of 509. She was transported to the emergency room at [facility] where it was determined that she was very severely dehydrated  with significant electrolyte abnormalities and, therefore, was admitted to  the critical care service.  Pulmonary critical care placed a central line in  order to volume resuscitate the patient and treat her electrolyte  abnormalities.  The patient was started on an insulin drip and treated with  IV fluids.  Her gap ultimately closed, and she was able to be converted back  to daily regimen of 20 units of NPH twice daily.  A nutritional consult was  obtained.  Recommendation was for outpatient diabetic care followup. We will  order an outpatient diabetic education consult, and the patient will follow  up with her endocrinologist, Dr. Roanna Raider.   On the day of discharge, the patient potassium was found to be slightly low.  I have, therefore, recommended IV repletion with a p.o. dose of potassium.  Pharmacy recommended an additional dose to assist in repleting phosphorus  which was low at 2.1.  The central line will be removed after her potassium  run, and she will be observed for a brief period of time.  If her central  line site is stable, then she can be discharged to home with followup to Dr.  Uvaldo Rising and Dr. Roanna Raider.   On the day of discharge, the patient's vital signs show temperature 98.4,  blood pressure 111/71, pulse 84, respirations 20.  Her blood sugars have  ranged from 115 to 251.  Generally, this is a frail white female in no acute  distress. She is tearful and angry.  She is normocephalic and atraumatic.  Pupils equal, round, and reactive to light.  Extraocular  movements intact.  Mucous membranes are moist.  Neck is supple.  There is no JVD, no lymph  nodes, and no carotid bruits heard.  Cardiovascular shows regular rate and rhythm, positive S1, S2, no S3, S4, murmurs, rubs, or gallops.  Abdomen is  soft, nontender, nondistended with  positive bowel sounds.  Extremities show  no clubbing, cyanosis, or edema. Neurologically, she is awake and oriented.  Cranial nerves II-XII intact.  Power is 5/5.   Pertinent laboratory values during the course of the hospital stay: Her  phosphorus at the time of discharge is 2.1 which will be repleted.  Magnesium is 1.8.  White count 4.4 with hemoglobin 10.4 and hematocrit of  30, platelets 321 .  Her sodium is 142, potassium 3.2 which will be  repleted.  Chloride is 110, CO2 25, BUN 3, glucose 150, creatinine 0.1, and  calcium is 8.2.  TSH is noted to be 1.816 with a T4 of 1.09.  Her urine  culture show no growth.  Her blood cultures show no grow x2.   At this time, the patient is deemed stable for discharge to follow up with  her outpatient primary care physician and endocrinologist.  I have given her  information regarding the American Diabetes Association and the chat board  that is there and my help her to express some of her feelings of frustration  among similar people who have like problems.      Melissa L. Ladona Ridgel, MD  Electronically Signed    MLT/MEDQ  D:  11/23/2005  T:  11/25/2005  Job:  [id number]   cc:   Pam Drown, M.D.  Fax: [phone]   Dr. Roanna Raider

## 2010-12-21 NOTE — H&P (Signed)
[name], [name]                 ACCOUNT NO.:  [id number]   MEDICAL RECORD NO.:  [id number]          PATIENT TYPE:  INP   LOCATION:  1832                         FACILITY:  [facility]   PHYSICIAN:  Coralyn Helling, M.D.      DATE OF BIRTH:  [birth year]   DATE OF ADMISSION:  11/20/2005  DATE OF DISCHARGE:                                HISTORY & PHYSICAL   REASON FOR ADMISSION:  Altered mental status/diabetic ketoacidosis.   HISTORY OF PRESENT ILLNESS:  This is a 30 year old white female with a known  history of type 1 diabetes.  She was in her usual state of health until  approximately two days ago, when she developed flu-like symptoms consisting  of aching abdominal pain, nausea and vomiting.  She has had a decreased p.o.  intake with this and generalized malaise.  Per her mother's report, the  patient called her mother this morning stating, I am not feeling well.  When the mother arrived, she found her daughter to be confused with speech  slurred and agitated at times.  EMS arrived and the patient was found to be  vomiting and dry heaving.  Her blood glucose on first assessment was 509.  She was transported to the [facility].  Treatment to date has  included IV insulin, IV fluids, and bicarbonate pushes.  We have been asked  to evaluate and assume her care in the critical illness.   PAST MEDICAL HISTORY:  1.  Diabetes, type 1.  2.  Asthma, questionable.  3.  Seasonal allergies.   SOCIAL HISTORY:  Lives alone, works in an [employer] group.  She  is not a smoker.  Denies drug or alcohol use.   MEDICATIONS:  Zyrtec, dose unknown, insulin Humalog and NPH, dose unknown.   ALLERGIES:  No known drug allergies.   REVIEW OF SYSTEMS:  Speech is slurred, occasional nausea, and confused  agitated.  All other pertinent positives per HPI.   PHYSICAL EXAMINATION:  VITAL SIGNS:  Temperature 98.6, heart rate 124 to 127  normal sinus rhythm/sinus tach, blood pressure 134/76,  respirations 30,  saturation is 100 on 4 liters nasal cannula.  EARS/NOSE/THROAT:  No JVD or adenopathy.  PULMONARY:  Clear to auscultation.  Chest x-ray, following central line  insertion, tip of the central line at the SVC/right atrial junction.  No  active cardiopulmonary disease.  EXTREMITIES:  No edema.  Brisk cap refill.  Warm to palpation.  ABDOMEN:  Soft, nontender, hypoactive bowel sounds.  GU:  Clear yellow urine.  NEUROLOGIC:  No focal motor deficits.  Confused.  Speech slurred.   LABORATORY DATA:  Initial ABG:  PH 7.91, pCO2 15, pO2 54, bicarbonate 3.  Followup, pH 7.22, CO2 10, pO2 138, bicarbonate less than 5.  Urinalysis:  Glucose greater than 1,000, ketones greater than 80, negative for leukocyte  esterase, negative for nitrates.  Pregnancy negative.  Sodium 129, potassium  6.9, chloride 104, CO2 8, BUN 15, creatinine 0.3, glucose 512.  White blood  cells 27.1, hemoglobin 13.2, hematocrit 39.9, platelets 459.   IMPRESSION/PLAN:  1.  Diabetic ketoacidosis with profound metabolic acidosis.  Plan for this      is aggressive hydration, keep CVP greater than 10 with central line      access, insulin drip, change intravenous fluids to D-5 1/2 normal saline      when CBG is less than 250, send serial ABGs and BNPs, panculture.  2.  Dehydration secondary to number one.  Plan aggressive hydration,      reassess blood chemistries, strict input and output.  3.  Hyperkalemia secondary to profound metabolic acidosis.  Plan for this is      to aggressively hydrate the patient and check serial blood chemistries.  4.  Prophylactic care.  We will admit the patient to intensive care,      schedule Protonix and subcutaneous Lovenox.      Anders Simmonds, N.P. [facility]      Coralyn Helling, M.D.  Electronically Signed    PB/MEDQ  D:  11/20/2005  T:  11/20/2005  Job:  [id number]

## 2010-12-21 NOTE — Discharge Summary (Signed)
NAMETORRENCE, BRANAGAN                 ACCOUNT NO.:  [id number]   MEDICAL RECORD NO.:  [id number]          PATIENT TYPE:  INP   LOCATION:  1617                         FACILITY:  [facility]   PHYSICIAN:  Corinna L. Lendell Caprice, MDDATE OF BIRTH:  June 09, 1981   DATE OF ADMISSION:  05/18/2006  DATE OF DISCHARGE:  05/20/2006                                 DISCHARGE SUMMARY   DISCHARGE DIAGNOSES:  1. Diabetic ketoacidosis.  2. Type 1 diabetes.  3. Dehydration.   DISCHARGE MEDICATIONS:  NPH 18 units subcutaneously q.a.m., 16 units  subcutaneously q.p.m. with Humalog with meals.  Her carb counting.   CONDITION:  Stable.   ACTIVITY:  Ad lib.   CONSULTATIONS:  None.   FOLLOW UP:  With endocrinologist.   PROCEDURES:  None.   PERTINENT LABORATORY DATA:  UA on admission showed moderate hemoglobin,  greater than 1000 glucose, moderate bilirubin, greater than 80 ketones, 30  protein, negative nitrite, negative leukocyte esterase.  0 to 2 white cells,  0 to 2 red cells.  Initial sodium was 132, potassium 5.2, chloride 98,  bicarbonate 10, glucose 459, BUN 15, creatinine 1.3, pH was 7.343, pCO2 23,  pO2 108, bicarbonate 12, base deficit 12, oxygen saturation 98% on blood  gas. CBC on admission was unremarkable.   HISTORY AND HOSPITAL COURSE:  Ms. Tisby is a pleasant 30 year old white  female patient of Dr. Corliss Blacker whose endocrinologist is in [facility].  She  presented with vomiting for several days.  She had a pulse initially 119 but  otherwise unremarkable vital signs.  She had dry mucous membranes, soft  abdomen.  She was found to be in DKA and was dehydrated.  She was started on  IV insulin and IV fluids.  Upon further questioning the patient reported  that she had been skipping some of her doses of NPH particularly at night  time.  Apparently she had been having some lows in the middle of the night  and she was scared to take her night time NPH.  Her hemoglobin A1c also was  12.  Urine  pregnancy negative.  Her anion gap resolved as did her vomiting  and she was transitioned back to her home regimen.  I put her on her usual  18 units of NPH in the morning and decreased her p.m. dose to 16 units.  She had no early morning hypoglycemia so I suggested that she try 16 units  and recommended that she call her endocrinologist if she has any questions  about her insulin rather than skipping doses.  Apparently she is scheduled  to start an insulin pump soon.      Corinna L. Lendell Caprice, MD  Electronically Signed     CLS/MEDQ  D:  05/20/2006  T:  05/21/2006  Job:  [id number]   cc:   Pam Drown, M.D.  Fax: [phone]

## 2010-12-21 NOTE — Discharge Summary (Signed)
[facility]  Patient:    [name]                           MRN: [id number] Adm. Date:  [id number] Disc. Date: 08/22/99 Attending:  Anise Salvo Dictator:   Trilby Drummer, M.D. CC:         Fritzi Mandes, M.D., [facility];             Clearence Cheek, Fax #[phone] (ASAP).                           Discharge Summary  DISCHARGE DIAGNOSES: 1. Diabetic ketoacidosis secondary to uncontrolled diabetes. 2. Uncontrolled diabetes -- hemoglobin A1c 9.1. 3. Social/anxiety issues.  DISCHARGE MEDICATIONS:  Home insulin as prescribed before:  Insulin:  A.m. -- Lente 32 units and 8 of Humalog; evening -- 22 Lente and 10 of Humalog.  FOLLOWUP:  Followup is with Dr. Fritzi Mandes, August 23, 1999, at 10:30 a.m. as well as her [facility] primary physician as needed.  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  She stated that she cared for a baby a few days ago whose parents had a similar illness.  She has not taken her  insulin in several days because she was not eating.  On speaking with her diabetic coordinator in [place], [place], Clearence Cheek, patient has been extremely noncompliant and embarrassed by being a diabetic and has not wanted to give injections to herself in front of her friends.  Patient was on increasing doses of insulin as she kept bringing CBGs which were increasingly high in previous times.  PHYSICAL EXAMINATION:  Dry mucous membranes.  Poor skin turgor; however, examination showed diffuse mild abdominal tenderness, more in the epigastrium.   LABORATORY AND X-RAY FINDINGS:  Sodium 121, potassium 6.7, chloride 93, bicarb ess than 5, BUN 33,  creatinine 1.5 with an anion gap of more than 23.  Patient had negative urine pregnancy test, next chest x-ray, negative UA, negative EKG.  HOSPITAL COURSE: DKA:  Patient was admitted, rehydrated aggressively as well as  started on an insulin drip.  Patient also had a combination of a non-anion gap acidosis secondary to her diarrhea.  She was able to take p.o.s on the second day after admission.  Her bicarb roughly corrected to 23 after one day of admission. Patient was educated continuously and constantly by our house staff as well as ur diabetic Best boy.  I spoke about the patient to Dr. Criss Alvine, who graciously offered to follow up her care.  FOLLOWUP:  Patient will be seeing Dr. Criss Alvine tomorrow morning where any other insulin adjustments will be made. DD:  08/22/99 TD:  08/22/99 Job: [id number]

## 2010-12-21 NOTE — Discharge Summary (Signed)
[name], SAFER                             ACCOUNT NO.:  [id number]   MEDICAL RECORD NO.:  [id number]                   PATIENT TYPE:  INP   LOCATION:  3035                                 FACILITY:  [facility]   PHYSICIAN:  Nani Gasser, M.D.            DATE OF BIRTH:  May 13, 1981   DATE OF ADMISSION:  11/24/2002  DATE OF DISCHARGE:  11/27/2002                                 DISCHARGE SUMMARY   DISCHARGE DIAGNOSES:  1. Diabetic ketoacidosis.  2. Diabetes mellitus type 1.  3. Sinusitis.  4. Hypokalemia.  5. Dehydration.   DISCHARGE MEDICATIONS:  1. Include Lantus 26 units subcu q.h.s., and from now on, she is to use a     sliding-scale coverage based on her blood glucose plus 1 unit of insulin     per each unit and eating 16 grams of carbohydrates.  She should calculate     this by taking her blood glucose minus 120 and divide it by 50, and this     gives her the number of units of Humalog to cover her blood sugar and     then add her units for her carbs that she is to eat.  2. Ampicillin 500 mg q.6h. for a total of 14 days.  3. A decongestant, such as Sudafed, for sinus infection.  4. Patient can continue her home allergy medications.   DISCHARGE INSTRUCTIONS:   FOLLOW UP:  1. Follow up with Dr. Lance Bosch at [facility] on Tuesday or Wednesday.  2. Follow up with Dr. Talmage Nap at __________ on Dec 13, 2002, at noon.   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  Patient did have a history of diabetes mellitus and was diagnosed with  diabetic ketoacidosis based on a blood pH of ABG of 6.967 and PCO2 of 9.1  and a PO2 of 147, which was on 2 liters of nasal cannula.  The following  problems were addressed:  1. Diabetic ketoacidosis.  Patient was rehydrated with IV fluids, was placed     on an insulin drip and then transferred to subcu injections.  Patient did     well and was  started on a new regimen.  Patient admitted that she had not     been taking most of her insulin that she had been previously prescribed.     Patient was to be in close followup with her physician, with whom she can     form a good relationship with.  2. Diabetes mellitus type 1.  Patient's hemoglobin A1c was 13.7.  Patient     did get some diabetic education in the hospital and required benefit from     continued education as an outpatient via her endocrinologist.  3. Dehydration.  Patient was volume resuscitated and had Phenergan p.r.n.  for her nausea.  Also considered that patient had been out in the sun     previous to her admission during most of the day and may have had some     environmental sun exposure causing dehydration along with her decreased     p.o. intake.  4. Hypokalemia.  Patient was given p.o. potassium __________ secondary to IV     fluids.                                               Nani Gasser, M.D.    CM/MEDQ  D:  12/16/2002  T:  12/17/2002  Job:  [id number]   cc:   Reynolds Bowl, M.D.  [address]  [place], [place] [address]  Fax: [phone]   Reinaldo Raddle. Lance Bosch, M.D.  [facility]  [address]  [place]  [place] [address]  Fax: [phone]   Dorisann Frames, M.D.  [facility] [place] [address]  Fax: [phone]

## 2010-12-21 NOTE — H&P (Signed)
[name], [name]                             ACCOUNT NO.:  [id number]   MEDICAL RECORD NO.:  [id number]                   PATIENT TYPE:  INP   LOCATION:  1826                                 FACILITY:  [facility]   PHYSICIAN:  Wayne A. Sheffield Slider, M.D.                 DATE OF BIRTH:  Oct 17, 1980   DATE OF ADMISSION:  11/24/2002  DATE OF DISCHARGE:                                HISTORY & PHYSICAL   RESIDENT PHYSICIAN:  Nani Gasser, M.D.   CHIEF COMPLAINT:  Nausea and vomiting.   HISTORY OF PRESENT ILLNESS:  The patient reported starting last night that  she was feeling nauseated around 10 p.m. and started vomiting.  She vomited  approximately five times, non-bilious and non-bloody.  The patient did say  she was eventually able to fall asleep and then woke up this morning still  feeling nauseated and started vomiting again.  The patient denies any sick  contacts.  She denies diarrhea.  The patient did report having worked  outdoors in the sun yesterday.  She also reports having not taken her Lantus  this morning or last night though normally she does take the Lantus, but she  does say that she does not usually take her insulin with her meals for  approximately the past five months.  She has also noticed a seven pound  weight loss over the past month.  The patient did have a visit with a  neuroendocrinologist in January, but she said that she did not have a good  meeting with this physician and did not want to follow-up with him.   PAST MEDICAL HISTORY:  1. Type 1 diabetes mellitus diagnosed at age 30.  2. Single prior episode approximately three years ago.   REVIEW OF SYMPTOMS:  GENERAL:  She has had the seven pound weight loss as  mentioned above.  HEENT:  She does wear contacts.  Mouth:  No problems  though currently complaining of a dry mouth.  PULMONARY:  No shortness of  breath.  GU:  No dysuria or pyuria or vaginal symptoms.  GI:  Does report  alternating constipation and  diarrhea for several years and also associated  diarrhea with new food but has recently increased fiber in her diet.  SKIN:  She denies any rashes or trauma.  EXTREMITIES: No numbness or tingling.  NEUROLOGIC:  No seizures.   MEDICATIONS:  1. Lantus 18 units q.a.m. and 18 units q.p.m.  2. Humalog one unit per carb unit and one unit per 100 of sliding scale.  3. Zyrtec 10 mg one p.o. q.h.s.  4. Imipramine 30 mg p.o. q.h.s.   ALLERGIES:  No known drug allergies.   FAMILY HISTORY:  She does have an older brother with mental retardation and  type 1 diabetes and a paternal grandfather with type 2 diabetes.   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  She does live  in an apartment by herself.   PHYSICAL EXAMINATION:  VITAL SIGNS:  Temperature 97.1, pulse 122, blood  pressure 126/50, respirations 24, saturations are 98% on two liters nasal  cannula.  GENERAL:  The patient is in no acute distress.  She does need occasional  reorientation to questions and does appear somewhat drowsy.  The patient has  had 12.5 mg of Phenergan below interview and has somewhat slurry speech.  HEENT:  Head is normocephalic, atraumatic.  Extraocular movements are  intact.  Pupils equal, round and reactive to light and accommodation without  any drainage.  Sclerae are white.  Conjunctivae are pink.  The nose is  without drainage.  Tongue has a reddish staining.  No thrush.  No drainage  from the oropharynx.  Mucous membranes are dry.  CARDIOVASCULAR:  She is tachycardic, normal rhythm.  Pulses are 2+  bilaterally in the carotid, pedal and radial pulses.  PULMONARY:  Clear to auscultation bilaterally.  ABDOMEN:  Positive bowel sounds, soft and mildly tender diffusely but no  rebound.  EXTREMITIES:  She moves all four equally.  NEUROLOGIC:  Cranial nerves II-XII are intact.  Gross motor is intact.   LABORATORY AND ACCESSORY DATA:  EKG:  Sinus tachycardia, rate 126 with  mildly elevated T  waves in 2, 3 and AVF.   ABG at 18:43 shows a pH of 6.967, a pC02 of 9.1 and a pH of 147 on 90 per  nasal cannula saturating 98%.  BMET:  Sodium 132, potassium 6.9, chloride  98, bicarbonate 10, BUN 23, creatinine 1.5, glucose 525.  AST 21, ALT 23,  BUN 1.8, creatinine 1.28.  White count 24.4 with 87% neutrophils, hemoglobin  16.9 and platelets 384,000.   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   1. Diabetic ketoacidosis.  This episode is most likely secondary to chronic     non-compliance with the patient's recommended regimen.  It is possible     that the patient's activities outside in the sun during most of yesterday     exacerbated the patient's current state and sent her into ketoacidosis.     We must also consider an infectious cause and pregnancy.  We will check a     urine pregnancy.  The patient was given two liters of normal saline and     10 units of insulin and a CBG went from 493 to 309.  The patient will be     started on a Glucometer and continue normal saline at 500 mL per hour.     An anion gap is currently 27 and once the gap closes and serum     bicarbonate is around 15, then we can change insulin to one or two units     per hour and start insulin Regular and NPH insulin.   1. Diabetes mellitus.  The patient was diagnosed at age 30, but is very     unhappy with her current regimen.  She is worried about scarring on her     abdomen as well as the frequency of her injections.  She is also unhappy     with the last endocrinologist that she saw and is very interested in     seeing if she is able to see Dr. Evlyn Kanner in the future.  The patient may     benefit from changing to a  regimen such as 70/30 with Lantus at night for     three daily injections.  We will also consider having the patient follow-    up with Dr. Lance Bosch for her diabetes temporarily.  The patient has had poor      control for quite some time.  We will not check a  hemoglobin A1c at this     time since we are aware that she is very poorly controlled, but this     should be followed up in three months after her regimen is altered to     ensure that she is compliant with her regimen.   1. Dehydration.  The patient with her dry mucous membranes and tachycardia     does appear to be very volume depleted.  The patient was still     tachycardic after two liters; thus, the patient is probably somewhere 8     to 10% dehydrated.  This could be secondary to the onset of diabetic     ketoacidosis with chronic non-compliance versus environmental such as     being out in the sun yesterday and becoming somewhat dehydrated and     taking poor p.o. intake.  We will also provide Phenergan for nausea.   1.     Infectious disease-wise, the patient's white blood count is currently     elevated.  The patient denies any symptoms of infection and there are no     signs seen on clinical exam of infection.  We will continue to monitor     for fever and vital signs.  Most likely her elevated white count is     secondary to her chronic acidosis.     Nani Gasser, M.D.                  Wayne A. Sheffield Slider, M.D.    CM/MEDQ  D:  11/24/2002  T:  11/25/2002  Job:  [id number]

## 2010-12-21 NOTE — Discharge Summary (Signed)
NAMESONNA, LIPSKY                 ACCOUNT NO.:  [id number]   MEDICAL RECORD NO.:  [id number]          PATIENT TYPE:  INP   LOCATION:  1610                         FACILITY:  [facility]   PHYSICIAN:  Sherin Quarry, MD      DATE OF BIRTH:  1981/07/25   DATE OF ADMISSION:  09/23/2005  DATE OF DISCHARGE:  09/26/2005                                 DISCHARGE SUMMARY   [name] is a 30 year old lady with a patient history of type 1 diabetes,  who presented on February 19, with a history of coughing, myalgias, and  vomiting.  She had not been taking her insulin because of concern about  hypoglycemia.  On presentation, she was noted to have a blood sugar of 411  and a serum bicarbonate of 9.  She complained of lethargy and generalized  myalgias.   She was initially seen by Dr. Virginia Rochester, who on physical exam, noted  that her blood pressure was 122/78, temperature 98.2, heart rate 120, O2  saturation was 99%.  HEENT exam was notable for dry mucous membranes.  Chest  was clear.  Cardiovascular exam revealed normal S1 and S2 without rubs,  murmurs, or gallops.  The abdomen was soft.  There were hypoactive bowel  sounds.  There was no guarding or rebound.  Neurologic testing and  examination of extremities was normal.   Relevant laboratory studies obtained included a white count of 13,100,  hemoglobin 14.7, platelet count 251,000.  The sodium was 126, potassium 5.1,  CO2 was 9, creatinine 1.2, glucose 411.  Hemoglobin was 14.7, and urine  pregnancy test was negative.  Lipase was normal.  A microscopic urinalysis  was unremarkable.  On admission, the patient was given an IV of normal  saline at 150 mL/hr and was placed on IV insulin for a Glucomander protocol.  IV Protonix was initiated.  A chest x-ray was within normal limits.  The  patient was switched to a sliding-scale regimen after the IV insulin was  discontinued.  Subsequently, her blood sugars were monitored and were in the  range  of 150-220.  Because of previously diagnosed Influenza, she was placed  on Tamiflu 75 mg twice daily.  By February 22, her blood sugar was 150,  blood pressure 117/64.  Temperature was 97.4.  She was tolerating a  reasonably normal diet.  Therefore, on February 22, it was felt reasonable  to discharge the patient.   DISCHARGE DIAGNOSES:  1.  Nausea, vomiting, myalgias, and cough possibly secondary to Influenza.  2.  uncontrolled diabetes with evidence of diabetic ketoacidosis.   DISCHARGE MEDICATIONS:  At the time of discharge, the patient was advised to  continue her usual insulin regimen.  This consists of 20 units of NPH  insulin each morning, 15 units of NPH insulin at bedtime.  She was also  advised to continue her usual sliding-scale Humalog regimen, as outlined by  her endocrinologist who is in [facility].  She was given Tamiflu with  instructions to take 75 mg twice daily today and tomorrow which will  complete  the usual  course of treatment.  She was also given Tessalon Perles 100 mg  with instructions to take 1 q.4-6h. p.r.n. for cough.   She will follow up with Dr. Gweneth Dimitri, also with her endocrinologist in  [facility].           ______________________________  Sherin Quarry, MD     SY/MEDQ  D:  09/26/2005  T:  09/27/2005  Job:  [id number]   cc:   Pam Drown, M.D.  Fax: [phone]

## 2010-12-21 NOTE — Discharge Summary (Signed)
[name], [name]                 ACCOUNT NO.:  [id number]   MEDICAL RECORD NO.:  [id number]          PATIENT TYPE:  INP   LOCATION:  5505                         FACILITY:  [facility]   PHYSICIAN:  Kela Millin, M.D.DATE OF BIRTH:  July 28, 1981   DATE OF ADMISSION:  09/04/2006  DATE OF DISCHARGE:  09/08/2006                               DISCHARGE SUMMARY   DISCHARGE DIAGNOSES:  1. Diabetic ketoacidosis.  2. Type 2 diabetes, uncontrolled.  3. Volume depletion.  4. History of seasonal allergies.   HISTORY:  The patient is a 30 year old type 1 diabetic patient who  presented with complaints of nausea and vomiting.  Her blood sugar was  checked and it was greater than 400, and she reported that she had had  insulin pump set as directed.  She denied any fevers, chills, also  denied cough, shortness of breath, no URI symptoms.  The patient also  denied dysuria.  She admitted to headaches.  No visual changes.  She  admitted to polyuria and polydipsia as well.   PHYSICAL EXAMINATION:  VITAL SIGNS:  Physical exam upon admission, as  per Dr. Julio Sicks, revealed a blood pressure of 105/59 with a pulse of  132, respiratory rate of 16, temperature of 99.2.  GENERAL:  The patient was in no acute distress.   The rest of her physical exam reported to be within normal limits.   LABORATORY DATA:  White cell count of 18, hemoglobin of 17, hematocrit  50, platelet count 402.  Sodium 116, potassium greater than 7.5,  chloride of 83, CO2 6, glucose 816, BUN of 28, creatinine 1.5.  Moderate  ketones.  Calcium 9.6.  Urinalysis negative for infection, glucosuria  greater than 1000.  The patient had a repeat potassium, which was 4.6.  A pH of 7.15.   HOSPITAL COURSE:  Problem 1.  DIABETIC KETOACIDOSIS/UNCONTROLLED TYPE 1 DIABETES MELLITUS:  Upon admission the patient was placed on IV insulin per Glucommander.  At about 2:45 a.m., the patient's blood sugars were in the 90s and per  on-call  physician she was taken off of the Glucommander and given Lantus  subcutaneously and Accu-Checks were monitored and she was covered with  sliding scale insulin.  Follow-up chemistries showed the patient was  still acidotic and so she was placed back no the Glucommander and her  chemistries monitored until the acidosis resolved.  The diabetes  coordinator saw the patient during the hospital stay and gave her  recommendations regarding the patient's insulin pump settings and also  recommended for the patient to have a safety check of the pump by phone  with the manufacturer.  Once her DKA resolved, she was then switched  over from the IV insulin via Glucommander to her insulin pump at the  recommended settings.  Her sugars were then closely monitored and it was  noted that they were still uncontrolled, ranging from 201 to about 425.  I talked with the patient's outpatient diabetes educator, who usually  adjusts her insulin pump settings, and she changed the sensitivity on  the patient's pump and also increased her  pump basal rates to 0.550 from  12 a.m. to 2:59 a.m., and 0.575 from 3 a.m. to 5:59 a.m., and 0.700 from  6 a.m. to 11:59 p.m.  The patient's Accu-Checks were again closely  monitored and her blood sugars have improved, ranged overnight from 202  to 240.  The patient is hemodynamically stable.  Her nausea and vomiting  has resolved.  She has been tolerating p.o. well.  She was aggressively  hydrated with IV fluids during her hospital stay.  She is to call her  diabetes educator, Carmel Sacramento, today and in my discussion with Carmel Sacramento yesterday, she indicated that she will be talking to the patient  on a daily basis and monitoring her blood sugars.  I have instructed the  patient to follow up with her endocrinologist, Dr. Roanna Raider in [facility],  and she is also to follow up with her primary care physician, Pam Drown, M.D., in a week.   Problem 2.  VOLUME DEPLETION:  The  patient was adequately hydrated with  IV fluids during her hospital stay.   Problem 3.  HISTORY OF SEASONAL ALLERGIES:  The patient was maintained  on her Allegra during hospital stay.   DISCHARGE MEDICATIONS:  1. Patient to continue her Humalog insulin pump.  2. Patient to continue her preadmission medications as per her      medication reconciliation form.   FOLLOW-UP CARE:  1. Pam Drown, M.D., in one week.  2. Dr. Roanna Raider in [place], patient to call for appointment upon      discharge.  3. Patient to follow up with her diabetes educator, Carmel Sacramento, as      instructed.   DISCHARGE CONDITION:  Improved/stable.   DISCHARGE DIET:  Modified carbohydrate.      Kela Millin, M.D.  Electronically Signed     ACV/MEDQ  D:  09/08/2006  T:  09/08/2006  Job:  [id number]   cc:   Pam Drown, M.D.  Dr. Roanna Raider

## 2010-12-21 NOTE — H&P (Signed)
[name], [name]                 ACCOUNT NO.:  [id number]   MEDICAL RECORD NO.:  [id number]          PATIENT TYPE:  INP   LOCATION:  5501                         FACILITY:  [facility]   PHYSICIAN:  Jackie Plum, M.D.DATE OF BIRTH:  1981/06/26   DATE OF ADMISSION:  09/04/2006  DATE OF DISCHARGE:                              HISTORY & PHYSICAL   CHIEF COMPLAINT:  Nausea and vomiting.   HISTORY OF PRESENT ILLNESS:  The patient is a 30 year old diabetic who  presented with nausea and vomiting.  Her blood sugar checked was very  high, between 400 and above.  She had been taking her insulin as  directed.  She does not have a history of fever, chills.  No cough or  sputum production.  No chest pain, no shortness of breath.  She denies  any sinusitis or sore throat.  She has not had any dysuria or frequent  micturition.  She had headaches.  No visual changes.  She admits to  polyuria or polydipsia.   PAST MEDICAL HISTORY:  Positive for history of diabetes mellitus type 1  with admission for DKA in April 2007 and October 2007.  She has had a  history of seasonal allergies.   MEDICATION HISTORY:  The patient is on insulin pump.   FAMILY HISTORY:  Positive for diabetes mellitus.   ALLERGIES:  The patient does not have any medication allergies.   REVIEW OF SYSTEMS:  As above or unremarkable.   PHYSICAL EXAMINATION:  VITAL SIGNS:  Blood pressure 105/59, pulse 132,  respirations 16, temperature 99.2 degrees Fahrenheit.  GENERAL:  No acute distress.  The patient was not toxic looking.  HEENT:  Normocephalic and atraumatic.  Pupils equal, round and reactive  to light.  Extraocular movements intact.  NECK:  Supple.  No jugular venous distention.  LUNGS:  Clear to auscultation.  CARDIAC:  Regular.  No gallops.  ABDOMEN:  Soft.  Bowel sounds present.  EXTREMITIES:  No cyanosis, clubbing or edema.   LABORATORY DATA:  Reviewed.  The patient's white count was 18.0,  hemoglobin 17.0,  hematocrit 50.0.  Platelet count 402.  Sodium 116.  Potassium was more than 7.5.  Chloride 83.  CO2 6, glucose 816, BUN 28,  creatinine 1.50.  GFR 42.  She has moderate ketones, positive calcium  9.6.  UA was tested for moderate blood, more than 80 mg/dc ketones,  protein 30 mg, nitrate negative, leukocyte negative.  Few bacteria.  She  had a glucosuria of more than 1000 mg/dc.  The patient's follow-up  potassium level was 4.6.  PH 7.15.   IMPRESSION:  A 30 year old lady with history of type 1 diabetes  mellitus, presented with hyperglycemia with a pseudo-hyponatremia, CO2  of 6, PH of 7.15 consistent with metabolic acidosis.  Moderate acetone  noted.  The patient is admitted for further management of diabetic  ketoacidosis.  The patient was continued on IV fluids with IV insulin  and would need evaluation of her insulin pump by the device  manufacturer.  She may well need to be started back on NPH as previously  if the device cannot fixed while in house.  I doubt that she has any UTI  but if she has any fevers she will need to be cultured and antibiotics  initiated appropriately.   It is to be noted that the patient on arrival this afternoon was  apparently in the Emergency Room for about 7 hours according to the  Emergency Department physician.  On review of the patient's records, it  is apparent that they have called my partner, Dr. Donnalee Curry and  told her that the patient is a patient of [facility].  Apparently  because I believe she sees a [facility] endocrinologist.  Dr. Suanne Marker had  appropriately mentioned that a hospitalist service does not admit for  [facility] therefore the patient was transferred to the service of  [facility].  However, by the time [facility] got the patient after 7 hours  or so, they realized that her primary care physician is actually an  [place] physician and therefore there was a delay in initial admission of  the patient from the E. R. due to  miscommunication from the E. R.  The  patient is comfortable and does not have any acute problems at this  point as she is getting treated now and plans to discontinue IV insulin  and start her on Lantus soon.      Jackie Plum, M.D.  Electronically Signed     GO/MEDQ  D:  09/05/2006  T:  09/05/2006  Job:  [id number]

## 2010-12-21 NOTE — H&P (Signed)
[name], [name]                 ACCOUNT NO.:  [id number]   MEDICAL RECORD NO.:  [id number]          PATIENT TYPE:  INP   LOCATION:  0102                         FACILITY:  [facility]   PHYSICIAN:  Hollice Espy, M.D.DATE OF BIRTH:  15-Sep-1980   DATE OF ADMISSION:  09/23/2005  DATE OF DISCHARGE:                                HISTORY & PHYSICAL   PRIMARY CARE PHYSICIAN:  Dr. Gweneth Dimitri   CHIEF COMPLAINT:  Nausea, vomiting, and abdominal pain.   HISTORY OF PRESENT ILLNESS:  The patient is a 30 year old white female with  past medical history of diabetes mellitus type 1 x10 years who has had  previous episodes of DKA but has not had one for a few years now. She was  recently diagnosed as an outpatient with a positive influenza A culture and  since Saturday has been having problems with just myalgias, nausea,  vomiting, and unable to keep anything down. She has taken minimal amounts of  her insulin secondary to concerns of hypoglycemia once she started feeling  this way and came to the emergency room today for further evaluation. She  was noted to have a white count of 13.1 with an 86% shift. Other labs of  concern were a glucose of 411 and a bicarb level of 9 that left her with an  anion gap of 19. The patient's BUN was normal at 14 but her creatinine was  slightly elevated at 1.2. Currently, the patient is lethargic and feels  quite weak. She complains of hurting all over. She does complain of a sore  throat secondary to her episodes of vomiting and she does complain of a  headache. She denies any vision changes, no dysphagia, no chest pain, no  palpitations. She does complain of some generalized shortness of breath. She  does complain of some abdominal pain, no constipation or diarrhea. No focal  extremity numbness, weakness, or pain, but overall she feels quite fatigued  and uncomfortable. Her review of systems is otherwise negative.   PAST MEDICAL HISTORY:  Significant for  diabetes mellitus type 1.   MEDICATIONS:  She tells me she is on Lantus and Glucophage.   ALLERGIES:  None.   SOCIAL HISTORY:  She denies any tobacco, alcohol, or drug use.   FAMILY HISTORY:  Noncontributory.   PHYSICAL EXAMINATION:  VITAL SIGNS ON ADMISSION:  Temperature 98.2, heart  rate 128, blood pressure 122/78, respirations 18, O2 saturation 99% on room  air.  GENERAL:  The patient is drowsy but able to be awakened. She is alert and  oriented x3 in some mild distress generalized.  HEENT:  Normocephalic, atraumatic. Mucous membranes are quite dry. She has  no carotid bruits.  HEART:  Regular rhythm, loud S1, S2 and mild tachycardia.  LUNGS:  Clear to auscultation bilaterally.  ABDOMEN:  Soft, mild generalized tenderness, hypoactive bowel sounds.  EXTREMITIES:  No clubbing or cyanosis, no edema.   LABORATORY DATA:  White count 13.1 with an 86% shift. H&H 14.7 and 43.2, MCV  of 92, platelet count 251. Sodium 126, potassium 5.1, chloride 98, bicarb 9,  BUN 14, creatinine 1.2, glucose 411. LFTs are noted for a total bili of 1.4;  otherwise, unremarkable. UA shows greater than 1000 of glucose, small  hemoglobin, greater than 80 of ketones, and otherwise unremarkable. She has  moderate serum acetone in her blood.   ASSESSMENT AND PLAN:  1.  Diabetic ketoacidosis in a type 1 diabetic. We will start her on the DKA      protocol, stepdown bed, IV fluids, insulin Glucommander, following her      blood sugars until her anion gap has resolved.  2.  Recent diagnosis of influenza. Will monitor for now. Hold on starting      Tamiflu until immediate DKA is resolved.      Hollice Espy, M.D.  Electronically Signed     SKK/MEDQ  D:  09/23/2005  T:  09/23/2005  Job:  [id number]   cc:   Pam Drown, M.D.  Fax: [phone]

## 2010-12-21 NOTE — H&P (Signed)
[name], [name]                 ACCOUNT NO.:  [id number]   MEDICAL RECORD NO.:  [id number]          PATIENT TYPE:  INP   LOCATION:  1617                         FACILITY:  [facility]   PHYSICIAN:  Kela Millin, M.D.DATE OF BIRTH:  1981-05-14   DATE OF ADMISSION:  05/18/2006  DATE OF DISCHARGE:                                HISTORY & PHYSICAL   PRIMARY CARE PHYSICIAN:  [name], M.D.   CHIEF COMPLAINT:  Persistent nausea and vomiting   HISTORY OF PRESENT ILLNESS:  The patient is a 30 year old white female with  past medical history significant for type I diabetes mellitus, last  hospitalized in April 2007, for DKA, and she presents for nausea and  vomiting x1 day.  She has vomited 9-10 times since 7 a.m.  Denies  hematemesis, diarrhea, dysuria, fevers, abdominal pain, cough, no urinary  symptoms.   The patient was seen in the ER and her labs revealed a blood glucose of 459  with a CO2 of 10.  She is admitted to the [facility] for  further evaluation and management.   PAST MEDICAL HISTORY:  1. As stated above.  2. Seasonal allergies.  3. Question asthma.   MEDICATIONS:  1. NPH 18 units subcu b.i.d.  2. Humalog sliding scale.   ALLERGIES:  NKDA.   SOCIAL HISTORY:  She denies tobacco, rare alcohol.   FAMILY HISTORY:  Her brother has type I diabetes, and her grandfather had  type II diabetes.   REVIEW OF SYSTEMS:  As per HPI, other review of systems negative.   PHYSICAL EXAMINATION:  GENERAL:  The patient is a young white female.  She  is alert and oriented and appropriate in no apparent distress.  VITAL SIGNS:  Temperature 99.2, blood pressure 124/56 with pulse of 69  initially 119, respirations 18 and O2 saturation 100%.  HEENT:  PERRL.  EOMI.  Sclerae anicteric.  Dry mucous membranes.  NECK:  Supple.  No adenopathy, no thyromegaly.  LUNGS:  Clear to auscultation bilaterally.  No crackles or wheezes.  CARDIOVASCULAR:  Regular rate and rhythm.   Normal S1, S2.  ABDOMEN:  Soft, no bowel sounds present.  Nontender, nondistended.  No  organomegaly and no masses palpable.  EXTREMITIES:  No cyanosis and no edema.  NEUROLOGICAL:  Alert and oriented x3.  Cranial nerves II-XII grossly intact.  Nonfocal exam.   LABORATORY DATA:  Urinalysis shows urine glucose greater than 1000 with  specific gravity of 1.0-8 and urine nitrite negative.  Leukocyte esterase  negative.  Urine WBC 0-2.  Her white cell count 13.7, hemoglobin 14.2,  hematocrit 40.6, platelets 353,000.  Neutrophil count 71%.  Sodium 132,  potassium 5.2, chloride 98, CO2 10, glucose 459, BUN 15, creatinine 1.3,  calcium 9.6.   ASSESSMENT/PLAN:  1. Type I diabetes with diabetic ketoacidosis.  Will place the patient on      IV insulin per Glucometer protocol, aggressive hydration with IV      fluids, monitor B-met, check hemoglobin A1C.  UA is unremarkable,      follow.  2. Follow depletion, likely  precipitating factor for #1, hydrate, follow      and recheck.      Kela Millin, M.D.  Electronically Signed     ACV/MEDQ  D:  05/18/2006  T:  05/19/2006  Job:  [id number]   cc:   [name], M.D.  Fax: [phone]

## 2011-01-04 ENCOUNTER — Emergency Department (HOSPITAL_COMMUNITY): Payer: BC Managed Care – PPO

## 2011-01-04 ENCOUNTER — Inpatient Hospital Stay (HOSPITAL_COMMUNITY)
Admission: EM | Admit: 2011-01-04 | Discharge: 2011-01-08 | DRG: 566 | Disposition: A | Discharge status: Home / Self Care | Payer: BC Managed Care – PPO | Attending: Internal Medicine | Admitting: Internal Medicine

## 2011-01-04 DIAGNOSIS — E875 Hyperkalemia: Secondary | ICD-10-CM | POA: Diagnosis present

## 2011-01-04 DIAGNOSIS — E1010 Type 1 diabetes mellitus with ketoacidosis without coma: Principal | ICD-10-CM | POA: Diagnosis present

## 2011-01-04 DIAGNOSIS — R509 Fever, unspecified: Secondary | ICD-10-CM | POA: Diagnosis present

## 2011-01-04 DIAGNOSIS — I959 Hypotension, unspecified: Secondary | ICD-10-CM | POA: Diagnosis present

## 2011-01-04 DIAGNOSIS — N179 Acute kidney failure, unspecified: Secondary | ICD-10-CM | POA: Diagnosis present

## 2011-01-04 DIAGNOSIS — T380X5A Adverse effect of glucocorticoids and synthetic analogues, initial encounter: Secondary | ICD-10-CM | POA: Diagnosis present

## 2011-01-04 DIAGNOSIS — K649 Unspecified hemorrhoids: Secondary | ICD-10-CM | POA: Diagnosis present

## 2011-01-04 DIAGNOSIS — Z9641 Presence of insulin pump (external) (internal): Secondary | ICD-10-CM

## 2011-01-04 DIAGNOSIS — F329 Major depressive disorder, single episode, unspecified: Secondary | ICD-10-CM | POA: Diagnosis present

## 2011-01-04 DIAGNOSIS — R197 Diarrhea, unspecified: Secondary | ICD-10-CM | POA: Diagnosis not present

## 2011-01-04 DIAGNOSIS — E871 Hypo-osmolality and hyponatremia: Secondary | ICD-10-CM | POA: Diagnosis present

## 2011-01-04 DIAGNOSIS — F3289 Other specified depressive episodes: Secondary | ICD-10-CM | POA: Diagnosis present

## 2011-01-04 DIAGNOSIS — IMO0002 Reserved for concepts with insufficient information to code with codable children: Secondary | ICD-10-CM

## 2011-01-04 DIAGNOSIS — Z794 Long term (current) use of insulin: Secondary | ICD-10-CM

## 2011-01-04 DIAGNOSIS — D72829 Elevated white blood cell count, unspecified: Secondary | ICD-10-CM | POA: Diagnosis present

## 2011-01-04 DIAGNOSIS — E2749 Other adrenocortical insufficiency: Secondary | ICD-10-CM | POA: Diagnosis present

## 2011-01-04 LAB — BLOOD GAS, ARTERIAL
Acid-base deficit: 18.2 mmol/L — ABNORMAL HIGH (ref 0.0–2.0)
Bicarbonate: 6.9 meq/L — ABNORMAL LOW (ref 20.0–24.0)
Drawn by: 103701
FIO2: 0.21 %
O2 Saturation: 98.2 %
Patient temperature: 98.6
TCO2: 6.2 mmol/L (ref 0–100)
pCO2 arterial: 15.1 mmHg — CL (ref 35.0–45.0)
pH, Arterial: 7.283 — ABNORMAL LOW (ref 7.350–7.400)
pO2, Arterial: 124 mmHg — ABNORMAL HIGH (ref 80.0–100.0)

## 2011-01-04 LAB — COMPREHENSIVE METABOLIC PANEL
Albumin: 4 g/dL (ref 3.5–5.2)
BUN: 26 mg/dL — ABNORMAL HIGH (ref 6–23)
Creatinine, Ser: 1.26 mg/dL — ABNORMAL HIGH (ref 0.4–1.2)
Total Bilirubin: 0.5 mg/dL (ref 0.3–1.2)
Total Protein: 7.7 g/dL (ref 6.0–8.3)

## 2011-01-04 LAB — DIFFERENTIAL
Eosinophils Relative: 1 % (ref 0–5)
Monocytes Relative: 9 % (ref 3–12)
Neutrophils Relative %: 75 % (ref 43–77)

## 2011-01-04 LAB — GLUCOSE, CAPILLARY
Glucose-Capillary: >600 mg/dL (ref 70–99)
Glucose-Capillary: 551 mg/dL (ref 70–99)
Glucose-Capillary: 529 mg/dL — ABNORMAL HIGH (ref 70–99)
Glucose-Capillary: 323 mg/dL — ABNORMAL HIGH (ref 70–99)

## 2011-01-04 LAB — URINALYSIS, ROUTINE W REFLEX MICROSCOPIC
Bilirubin Urine: NEGATIVE
Glucose, UA: >1000 mg/dL — AB
Ketones, ur: >80 mg/dL — AB
Leukocytes, UA: NEGATIVE
Nitrite: NEGATIVE
Protein, ur: NEGATIVE mg/dL
Specific Gravity, Urine: 1.028 (ref 1.005–1.030)
Urobilinogen, UA: 0.2 mg/dL (ref 0.0–1.0)
pH: 5.5 (ref 5.0–8.0)

## 2011-01-04 LAB — CBC
HCT: 41.8 % (ref 36.0–46.0)
Hemoglobin: 15 g/dL (ref 12.0–15.0)
MCH: 31.8 pg (ref 26.0–34.0)
MCHC: 35.9 g/dL (ref 30.0–36.0)
MCV: 88.7 fL (ref 78.0–100.0)
Platelets: 342 K/uL (ref 150–400)
RBC: 4.71 MIL/uL (ref 3.87–5.11)
RDW: 12 % (ref 11.5–15.5)
WBC: 24.9 K/uL — ABNORMAL HIGH (ref 4.0–10.5)

## 2011-01-04 LAB — TROPONIN I

## 2011-01-04 LAB — CK TOTAL AND CKMB (NOT AT ARMC)
CK, MB: 1.1 ng/mL (ref 0.3–4.0)
Total CK: 18 U/L (ref 7–177)

## 2011-01-04 LAB — URINE MICROSCOPIC-ADD ON

## 2011-01-04 LAB — LIPASE, BLOOD: Lipase: 18 U/L (ref 11–59)

## 2011-01-05 DIAGNOSIS — N179 Acute kidney failure, unspecified: Secondary | ICD-10-CM

## 2011-01-05 DIAGNOSIS — E1010 Type 1 diabetes mellitus with ketoacidosis without coma: Secondary | ICD-10-CM

## 2011-01-05 LAB — GLUCOSE, CAPILLARY
Glucose-Capillary: 217 mg/dL — ABNORMAL HIGH (ref 70–99)
Glucose-Capillary: 231 mg/dL — ABNORMAL HIGH (ref 70–99)
Glucose-Capillary: 242 mg/dL — ABNORMAL HIGH (ref 70–99)
Glucose-Capillary: 246 mg/dL — ABNORMAL HIGH (ref 70–99)
Glucose-Capillary: 225 mg/dL — ABNORMAL HIGH (ref 70–99)
Glucose-Capillary: 220 mg/dL — ABNORMAL HIGH (ref 70–99)
Glucose-Capillary: 139 mg/dL — ABNORMAL HIGH (ref 70–99)
Glucose-Capillary: 134 mg/dL — ABNORMAL HIGH (ref 70–99)
Glucose-Capillary: 119 mg/dL — ABNORMAL HIGH (ref 70–99)

## 2011-01-05 LAB — CBC
HCT: 30.8 % — ABNORMAL LOW (ref 36.0–46.0)
Hemoglobin: 11.7 g/dL — ABNORMAL LOW (ref 12.0–15.0)
MCH: 31.5 pg (ref 26.0–34.0)
MCH: 31.7 pg (ref 26.0–34.0)
MCHC: 36 g/dL (ref 30.0–36.0)
MCV: 87.4 fL (ref 78.0–100.0)
Platelets: 245 10*3/uL (ref 150–400)
RBC: 3.51 MIL/uL — ABNORMAL LOW (ref 3.87–5.11)
RBC: 3.69 MIL/uL — ABNORMAL LOW (ref 3.87–5.11)
RDW: 12.3 % (ref 11.5–15.5)
RDW: 12.5 % (ref 11.5–15.5)
WBC: 13.2 10*3/uL — ABNORMAL HIGH (ref 4.0–10.5)
WBC: 17.2 10*3/uL — ABNORMAL HIGH (ref 4.0–10.5)
WBC: 25.8 10*3/uL — ABNORMAL HIGH (ref 4.0–10.5)

## 2011-01-05 LAB — PHOSPHORUS: Phosphorus: 1.6 mg/dL — ABNORMAL LOW (ref 2.3–4.6)

## 2011-01-05 LAB — BASIC METABOLIC PANEL
BUN: 18 mg/dL (ref 6–23)
BUN: 11 mg/dL (ref 6–23)
BUN: 7 mg/dL (ref 6–23)
CO2: 16 mEq/L — ABNORMAL LOW (ref 19–32)
CO2: 17 mEq/L — ABNORMAL LOW (ref 19–32)
CO2: 16 mEq/L — ABNORMAL LOW (ref 19–32)
Calcium: 6.9 mg/dL — ABNORMAL LOW (ref 8.4–10.5)
Calcium: 7.1 mg/dL — ABNORMAL LOW (ref 8.4–10.5)
Chloride: 106 mEq/L (ref 96–112)
Chloride: 108 mEq/L (ref 96–112)
Chloride: 106 mEq/L (ref 96–112)
Creatinine, Ser: 0.65 mg/dL (ref 0.4–1.2)
Creatinine, Ser: 0.55 mg/dL (ref 0.4–1.2)
GFR calc Af Amer: >60 mL/min (ref >60)
GFR calc Af Amer: >60 mL/min (ref >60)
GFR calc Af Amer: >60 mL/min (ref >60)
GFR calc non Af Amer: >60 mL/min (ref >60)
GFR calc non Af Amer: >60 mL/min (ref >60)
GFR calc non Af Amer: >60 mL/min (ref >60)
GFR calc non Af Amer: >60 mL/min (ref >60)
Glucose, Bld: 254 mg/dL — ABNORMAL HIGH (ref 70–99)
Glucose, Bld: 218 mg/dL — ABNORMAL HIGH (ref 70–99)
Glucose, Bld: 156 mg/dL — ABNORMAL HIGH (ref 70–99)
Potassium: 4.2 mEq/L (ref 3.5–5.1)
Potassium: 4.2 mEq/L (ref 3.5–5.1)
Potassium: 3.5 mEq/L (ref 3.5–5.1)
Potassium: 3.5 mEq/L (ref 3.5–5.1)
Potassium: 4.1 mEq/L (ref 3.5–5.1)
Sodium: 136 mEq/L (ref 135–145)
Sodium: 135 mEq/L (ref 135–145)
Sodium: 133 mEq/L — ABNORMAL LOW (ref 135–145)
Sodium: 129 mEq/L — ABNORMAL LOW (ref 135–145)
Sodium: 124 mEq/L — ABNORMAL LOW (ref 135–145)

## 2011-01-05 LAB — PROCALCITONIN: Procalcitonin: 6.81 ng/mL

## 2011-01-05 LAB — LACTIC ACID, PLASMA: Lactic Acid, Venous: 2.1 mmol/L (ref 0.5–2.2)

## 2011-01-05 LAB — MRSA PCR SCREENING: MRSA by PCR: NEGATIVE

## 2011-01-06 ENCOUNTER — Inpatient Hospital Stay (HOSPITAL_COMMUNITY): Payer: BC Managed Care – PPO

## 2011-01-06 DIAGNOSIS — E782 Mixed hyperlipidemia: Secondary | ICD-10-CM

## 2011-01-06 DIAGNOSIS — N179 Acute kidney failure, unspecified: Secondary | ICD-10-CM

## 2011-01-06 DIAGNOSIS — E1010 Type 1 diabetes mellitus with ketoacidosis without coma: Secondary | ICD-10-CM

## 2011-01-06 LAB — BASIC METABOLIC PANEL
BUN: 9 mg/dL (ref 6–23)
BUN: 13 mg/dL (ref 6–23)
BUN: 10 mg/dL (ref 6–23)
BUN: 9 mg/dL (ref 6–23)
CO2: 7 mEq/L — CL (ref 19–32)
CO2: 16 mEq/L — ABNORMAL LOW (ref 19–32)
Calcium: 6.8 mg/dL — ABNORMAL LOW (ref 8.4–10.5)
Calcium: 6.7 mg/dL — ABNORMAL LOW (ref 8.4–10.5)
Calcium: 7.1 mg/dL — ABNORMAL LOW (ref 8.4–10.5)
Chloride: 97 mEq/L (ref 96–112)
Chloride: 108 mEq/L (ref 96–112)
Creatinine, Ser: 0.61 mg/dL (ref 0.4–1.2)
Creatinine, Ser: 0.58 mg/dL (ref 0.4–1.2)
Creatinine, Ser: 0.62 mg/dL (ref 0.4–1.2)
GFR calc Af Amer: >60 mL/min (ref >60)
GFR calc Af Amer: >60 mL/min (ref >60)
GFR calc non Af Amer: >60 mL/min (ref >60)
GFR calc non Af Amer: >60 mL/min (ref >60)
GFR calc non Af Amer: >60 mL/min (ref >60)
Glucose, Bld: 387 mg/dL — ABNORMAL HIGH (ref 70–99)
Glucose, Bld: 218 mg/dL — ABNORMAL HIGH (ref 70–99)
Glucose, Bld: 210 mg/dL — ABNORMAL HIGH (ref 70–99)
Potassium: 5.2 mEq/L — ABNORMAL HIGH (ref 3.5–5.1)
Potassium: 3.7 mEq/L (ref 3.5–5.1)
Potassium: 3.5 mEq/L (ref 3.5–5.1)
Sodium: 130 mEq/L — ABNORMAL LOW (ref 135–145)
Sodium: 136 mEq/L (ref 135–145)

## 2011-01-06 LAB — CBC
HCT: 32.3 % — ABNORMAL LOW (ref 36.0–46.0)
HCT: 30.5 % — ABNORMAL LOW (ref 36.0–46.0)
Hemoglobin: 11.3 g/dL — ABNORMAL LOW (ref 12.0–15.0)
Hemoglobin: 11.5 g/dL — ABNORMAL LOW (ref 12.0–15.0)
MCH: 31.5 pg (ref 26.0–34.0)
MCHC: 35.9 g/dL (ref 30.0–36.0)
MCHC: 36.1 g/dL — ABNORMAL HIGH (ref 30.0–36.0)
MCV: 88.3 fL (ref 78.0–100.0)
Platelets: 218 10*3/uL (ref 150–400)
Platelets: 254 10*3/uL (ref 150–400)
RBC: 3.66 MIL/uL — ABNORMAL LOW (ref 3.87–5.11)
RDW: 12.7 % (ref 11.5–15.5)
WBC: 28.3 10*3/uL — ABNORMAL HIGH (ref 4.0–10.5)
WBC: 19.4 10*3/uL — ABNORMAL HIGH (ref 4.0–10.5)

## 2011-01-06 LAB — GLUCOSE, CAPILLARY
Glucose-Capillary: 445 mg/dL — ABNORMAL HIGH (ref 70–99)
Glucose-Capillary: 466 mg/dL — ABNORMAL HIGH (ref 70–99)
Glucose-Capillary: 295 mg/dL — ABNORMAL HIGH (ref 70–99)
Glucose-Capillary: 109 mg/dL — ABNORMAL HIGH (ref 70–99)

## 2011-01-06 LAB — URINE CULTURE

## 2011-01-06 LAB — MAGNESIUM: Magnesium: 2.2 mg/dL (ref 1.5–2.5)

## 2011-01-06 LAB — PROCALCITONIN: Procalcitonin: 4.03 ng/mL

## 2011-01-07 LAB — BASIC METABOLIC PANEL
BUN: 11 mg/dL (ref 6–23)
BUN: 12 mg/dL (ref 6–23)
BUN: 13 mg/dL (ref 6–23)
BUN: 16 mg/dL (ref 6–23)
CO2: 17 mEq/L — ABNORMAL LOW (ref 19–32)
CO2: 20 mEq/L (ref 19–32)
Calcium: 7.5 mg/dL — ABNORMAL LOW (ref 8.4–10.5)
Calcium: 7.9 mg/dL — ABNORMAL LOW (ref 8.4–10.5)
Chloride: 111 mEq/L (ref 96–112)
Chloride: 108 mEq/L (ref 96–112)
Chloride: 109 mEq/L (ref 96–112)
Creatinine, Ser: 0.48 mg/dL (ref 0.4–1.2)
Creatinine, Ser: 0.49 mg/dL (ref 0.4–1.2)
GFR calc Af Amer: >60 mL/min (ref >60)
GFR calc Af Amer: >60 mL/min (ref >60)
GFR calc Af Amer: >60 mL/min (ref >60)
GFR calc non Af Amer: >60 mL/min (ref >60)
GFR calc non Af Amer: >60 mL/min (ref >60)
GFR calc non Af Amer: >60 mL/min (ref >60)
Glucose, Bld: 83 mg/dL (ref 70–99)
Glucose, Bld: 260 mg/dL — ABNORMAL HIGH (ref 70–99)
Glucose, Bld: 100 mg/dL — ABNORMAL HIGH (ref 70–99)
Potassium: 3.5 mEq/L (ref 3.5–5.1)
Potassium: 3.9 mEq/L (ref 3.5–5.1)
Potassium: 3.9 mEq/L (ref 3.5–5.1)
Potassium: 3.8 mEq/L (ref 3.5–5.1)
Potassium: 3.6 mEq/L (ref 3.5–5.1)
Sodium: 137 mEq/L (ref 135–145)
Sodium: 137 mEq/L (ref 135–145)
Sodium: 136 mEq/L (ref 135–145)

## 2011-01-07 LAB — GLUCOSE, CAPILLARY
Glucose-Capillary: 204 mg/dL — ABNORMAL HIGH (ref 70–99)
Glucose-Capillary: 207 mg/dL — ABNORMAL HIGH (ref 70–99)
Glucose-Capillary: 125 mg/dL — ABNORMAL HIGH (ref 70–99)
Glucose-Capillary: 108 mg/dL — ABNORMAL HIGH (ref 70–99)
Glucose-Capillary: 125 mg/dL — ABNORMAL HIGH (ref 70–99)
Glucose-Capillary: 162 mg/dL — ABNORMAL HIGH (ref 70–99)
Glucose-Capillary: 189 mg/dL — ABNORMAL HIGH (ref 70–99)
Glucose-Capillary: 222 mg/dL — ABNORMAL HIGH (ref 70–99)
Glucose-Capillary: 163 mg/dL — ABNORMAL HIGH (ref 70–99)
Glucose-Capillary: 129 mg/dL — ABNORMAL HIGH (ref 70–99)
Glucose-Capillary: 206 mg/dL — ABNORMAL HIGH (ref 70–99)
Glucose-Capillary: 246 mg/dL — ABNORMAL HIGH (ref 70–99)
Glucose-Capillary: 198 mg/dL — ABNORMAL HIGH (ref 70–99)
Glucose-Capillary: 147 mg/dL — ABNORMAL HIGH (ref 70–99)
Glucose-Capillary: 115 mg/dL — ABNORMAL HIGH (ref 70–99)

## 2011-01-07 LAB — CBC
HCT: 34.2 % — ABNORMAL LOW (ref 36.0–46.0)
MCV: 88.8 fL (ref 78.0–100.0)
RBC: 3.85 MIL/uL — ABNORMAL LOW (ref 3.87–5.11)
RDW: 13 % (ref 11.5–15.5)
WBC: 19.2 10*3/uL — ABNORMAL HIGH (ref 4.0–10.5)

## 2011-01-07 LAB — PHOSPHORUS: Phosphorus: 2.4 mg/dL (ref 2.3–4.6)

## 2011-01-07 LAB — MAGNESIUM: Magnesium: 2 mg/dL (ref 1.5–2.5)

## 2011-01-07 LAB — HEMOGLOBIN A1C: Mean Plasma Glucose: 315 mg/dL — ABNORMAL HIGH (ref <117)

## 2011-01-08 LAB — BASIC METABOLIC PANEL
BUN: 22 mg/dL (ref 6–23)
BUN: 16 mg/dL (ref 6–23)
Calcium: 7.7 mg/dL — ABNORMAL LOW (ref 8.4–10.5)
Chloride: 106 mEq/L (ref 96–112)
Creatinine, Ser: 0.61 mg/dL (ref 0.4–1.2)
GFR calc non Af Amer: >60 mL/min (ref >60)
Glucose, Bld: 334 mg/dL — ABNORMAL HIGH (ref 70–99)
Potassium: 3.3 mEq/L — ABNORMAL LOW (ref 3.5–5.1)

## 2011-01-08 LAB — CBC
HCT: 32.3 % — ABNORMAL LOW (ref 36.0–46.0)
HCT: 34.1 % — ABNORMAL LOW (ref 36.0–46.0)
MCH: 31.3 pg (ref 26.0–34.0)
MCH: 31.3 pg (ref 26.0–34.0)
MCHC: 35.6 g/dL (ref 30.0–36.0)
MCV: 88 fL (ref 78.0–100.0)
MCV: 88.1 fL (ref 78.0–100.0)
Platelets: 270 10*3/uL (ref 150–400)
RBC: 3.87 MIL/uL (ref 3.87–5.11)
RDW: 13 % (ref 11.5–15.5)

## 2011-01-08 LAB — GLUCOSE, CAPILLARY
Glucose-Capillary: 438 mg/dL — ABNORMAL HIGH (ref 70–99)
Glucose-Capillary: 219 mg/dL — ABNORMAL HIGH (ref 70–99)

## 2011-01-08 LAB — POCT OCCULT BLOOD STOOL (DEVICE): Fecal Occult Bld: POSITIVE

## 2011-01-09 LAB — CROSSMATCH
Antibody Screen: NEGATIVE
Unit division: 0

## 2011-01-09 LAB — FECAL LACTOFERRIN, QUANT

## 2011-01-11 LAB — CULTURE, BLOOD (ROUTINE X 2)
Culture  Setup Time: 201206020405
Culture: NO GROWTH

## 2011-01-28 NOTE — Discharge Summary (Signed)
[name], [name] NO.:  [id number]  MEDICAL RECORD NO.:  [id number]  LOCATION:  1227                         FACILITY:  [facility]  PHYSICIAN:  Kathlen Mody, MD       DATE OF BIRTH:  13-Aug-1980  DATE OF ADMISSION:  01/04/2011 DATE OF DISCHARGE:                              DISCHARGE SUMMARY   PRIMARY CARE PHYSICIAN:  Pam Drown, MD  ENDOCRINOLOGIST:  Dr. Gala Lewandowsky at [facility].  DISCHARGE DIAGNOSES: 1. Diabetic ketoacidosis. 2. Addison's disease. 3. Leukocytosis. 4. Hyponatremia.  DISCHARGE MEDICATIONS:  Discharge medications to be dictated by the discharging physician.  CONSULTATIONS CALLED:  Critical Care consultation.  PROCEDURES: 1. The patient had a chest x-ray done on June 1 that shows no active     lung disease. 2. An abdominal x-ray shows probable mild ileus and no obstruction.  PERTINENT LABORATORY DATA:  The patient had a CBC done which showed a hemoglobin of 11.5, a hematocrit of 32, platelets of 246, WBC 14.2. Basic metabolic panel as of today is a sodium of 132, potassium 3.6, chloride 104, bicarbonate 16, glucose 334, BUN 22, creatinine 0.6, magnesium of 1.9, phosphorus of 1.5, hemoglobin A1c of 12.6. Procalcitonin was 6.8.  Lactic acid was 2.1.  Lipase was 18.  Urinalysis negative for nitrites and leukocytes.  Urine culture 75,000 colonies. Multiple bacteria morphotypes.  BRIEF HOSPITAL COURSE:  I have a 30 year old lady with a history of type 1 diabetes on insulin pump, history of Addison's disease, history of recurrent DKA, admitted now for similar complaints of nausea, vomiting and blood sugars in the 400s.  On admission she was found to be acidotic with DKA.  She was admitted to ICU.  She was put on insulin GTT and IV fluids as per the DKA protocol.  Over the next two days her CBGs are better controlled with CBGs less than 200 and she was transitioned to her insulin pump with a basal rate of 0.6 to 0.7 per hour.  As we  put her on her insulin pump, her sugars have increased to 400s and she went into DKA.  This could most likely be secondary to the high-dose steroids that she was given, the stress-dose steroids that she was given from the day of admission.  So her steroids were further decreased to 50 mg q.8 hours and she was put back on the insulin GTT and she was on DKA protocol.  On January 07, 2011, at 8:00 p.m. her insulin GTT was stopped and she was put back on her insulin pump with a basal rate of 0.7 and her sugars have shot up to 300s and 400s.  I spoke to the patient at length that her basal rate on the insulin pump is not giving her enough insulin, so at this point her insulin pump is being discontinued and she is being given Lantus subcutaneous injections.  She will get 17 units of Lantus injections and she will be on 3 units of premeal coverage t.i.d. a.c. and she will be put on sliding scale insulin with moderate scale correction.  Her stress-dose steroids have further been decreased to p.o. hydrocortisone 20 mg b.i.d. daily.  Her IV fluids have been discontinued.  She is able to tolerate a p.o. diet.  Her nausea, vomiting and abdominal pain has resolved at this point.  Hypotension.  On admission she was hypotensive with a blood pressure of 80s/40s.  She was started on stress-dose steroids and later transitioned to p.o. hydrocortisone 20 mg b.i.d.  Her blood pressure parameters are better, ranging from systolic ranging from 110s to 161W, diastolic 50s to 60s.  Addison's disease.  The patient is on hydrocortisone and fludrocortisone.  She is, at this time, on 20 mg of hydrocortisone p.o. b.i.d.  Acute renal failure has resolved.  The patient had episodes of diarrhea and saw some blood on the toilet paper when she had a bowel movement.  The patient has a history of hemorrhoids and her hemoglobin has been stable at around 11.  A GI consultation was called from Dr. Juanda Chance and Dr. Juanda Chance came  and spoke to me/[facility] and recommended Anusol hydrocortisone cream for hemorrhoids and no further workup at this time.  PHYSICAL EXAMINATION:  VITAL SIGNS:  As of today she is afebrile.  Blood pressure 120/60, pulse rate of 80 per minute, respirations 12 per minute, saturating 98% on room air. GENERAL:  On examination she is alert, afebrile, very emotional, but not in any acute distress. CARDIOVASCULAR EXAMINATION:  S1, S2 heard. RESPIRATORY EXAMINATION:  Chest clear to auscultation bilaterally.  No wheezing or rhonchi. ABDOMEN:  Soft, nontender, nondistended.  Bowel sounds are heard. EXTREMITIES:  She has pedal edema from IV fluids.  PLAN:  The patient will most likely be discharged on January 09, 2011, if her CBGs are better controlled on the Lantus and sliding scale insulin and she has an appointment with an endocrinologist on Thursday with Dr. Gala Lewandowsky at [facility].  The patient wishes to go back to her insulin pump on discharge and she is willing to change her basal rate as needed.          ______________________________ Kathlen Mody, MD     VA/MEDQ  D:  01/08/2011  T:  01/08/2011  Job:  [id number]  cc:   Pam Drown, M.D. Fax: [phone]  Electronically Signed by Kathlen Mody MD on 01/28/2011 01:45:09 AM

## 2011-02-12 NOTE — Consult Note (Signed)
NAMEPAETON, LATOUCHE                 ACCOUNT NO.:  [id number]  MEDICAL RECORD NO.:  [id number]           PATIENT TYPE:  I  LOCATION:  1227                         FACILITY[facility]  [facility]  PHYSICIAN:  Orbie Hurst, MD         DATE OF BIRTH:  [birth year]  DATE OF CONSULTATION: DATE OF DISCHARGE:                                CONSULTATION   REQUESTING PHYSICIAN:  Baltazar Najjar, MD  REASON FOR CONSULTATION:  Diabetes, ketoacidosis, fevers, and hypertension.  HISTORY OF PRESENT ILLNESS:  The patient is a 30 year old female with a past medical history of uncontrolled type 1 diabetes on insulin pump and a history of Addison disease, history of recurrent DKA who was brought to [facility] due to nausea, vomiting, elevated blood sugars, and lethargy.  As per the medical records, the patient reported that she has had persistent nausea and vomiting today and she had more nausea and vomiting today and in the ER the blood sugars were found to be in the600.  The patient was also found to be less responsive and more somnolent.  As per the medical records, she did not go to work today and her coworkers became concerned and they tried to contact her a multiple times and they went to her house and found her not looking well and called 9-1-1.  Initial workup at the [facility] showed, sodium 128, creatinine 1.26, glucose 600.  ABG with metabolic acidosis with pH 7.28, pCO2 of 15, pO2 124, bicarb 6.9.  Initial CBC showed white count of 24.9, normal lipase.  UA was negative.  Chest x-ray with no acute infiltrates.  The patient was started on insulin drip, and she was given hydrocortisone 100 mg.  She was sent to the ICU for further management. While in the ICU the patient developed an episode of fevers 101.  The patient's blood cultures were sent and the patient was started on vancomycin and Zosyn.  Critical care consultation was requested due to diabetic ketoacidosis with severe metabolic  acidosis, fevers, and hypertension.  REVIEW OF SYSTEMS:  Other systems were negative except as above in HPI.  PAST MEDICAL HISTORY: 1. Type 2 diabetes mellitus on insulin. 2. History of recurrent episodes of DKA and secondary to uncontrolled     type 1 diabetes. 3. History of Addison disease.  ALLERGIES:  No known drug allergies.  SOCIAL HISTORY:  She lives at home alone, and she denies smoking or illicit drug use.  She drinks alcohol rarely.  FAMILY HISTORY:  One brother with diabetes and grandmother with type 2 diabetes.  PHYSICAL EXAMINATION:  GENERAL:  The patient is somnolent and arousable to loud voice and oriented x2 in no acute respiratory distress. VITAL SIGNS:  Blood pressure is 90/50, and respiratory rate 22, temperature 37.5, heart rate 112, O2 saturation 95% on 2 liters nasal cannula. HEENT:  Oral mucosa is dry.  No JVD.  No cervical or supraclavicular lymphadenopathy.  CARDIOVASCULAR:  Tachycardic rhythm.  S1 and S2 normal. CHEST:  Clear breath sounds bilaterally with no wheezes, rhonchi, or crackles. ABDOMEN:  Soft, nontender, and nondistended.  No peritoneal signs. Positive bowel sounds. EXTREMITIES:  No lower extremity edema or calf numbness. NEUROLOGIC:  The patient is somnolent, arousable to loud voice, and she follows commands.  No focal signs.  LABORATORY AND IMAGING DATA:  ABG 7.28, pCO2 of 15, pO2 of 124, bicarb 6.9.  CBC:  WBC 24.9, hemoglobin 15.0, hematocrit 41.8, platelet count 342.  Troponin I less than 0.3.  Sodium 128, potassium 5.1, chloride 85, bicarb 10, glucose 635, BUN 26, creatinine 1.26, total bilirubin 0.5, alkaline phosphatase 118, AST 23, ALT 20, total protein 7.7, albumin 4.0, calcium 10.0.  Lipase 18.  UA with negative leukocytes negative nitrite, positive ketones, a few bacteria, a few squamous epithelial cells, wbc's 3-6.  Lactic acid 2.1.  Chest x-ray with no acute infiltrates or abnormalities.  ASSESSMENT:  The patient is a  30 year old female with a past medical history of type 2 diabetes with frequent episodes of diabetic ketoacidosis secondary to uncontrolled diabetes and history of Addison disease who comes today to [facility] after having episodes of nausea, vomiting and being found more lethargic as per the coworkers and as per her mother and the patient was found to have elevated blood sugars in the range of 600 with positive urine ketones and she was started on insulin drip and sent to the ICU for further management, and she developed fevers and mild episode of hypotension of 89/41 in the ER which responded to IV fluids.  1. Diabetic ketoacidosis.  Likely secondary to poor controlled type 1     diabetes.  We have to rule out an infectious cause.  The patient     developed an episode of fevers and blood cultures and urine     cultures were sent.  Chest x-ray shows no acute infiltrates.  The     patient was started on vancomycin and Zosyn.  The patient will     continue insulin drip and until the gap closes and her blood sugars     are under better control.  She will continue on IV fluid boluses.     She got 5 liters in the ER and she will get 1 more liter here in     the ICU.  She will continue D5 0.40 half normal saline 200 mL/hour.     We will continue her glucose management as per the DKA protocol. 2. Hypotension.  The patient developed mild episode of hypotension     downstairs in the ER likely secondary to intravascular volume     depletion and dehydration.  She had 5 liters of IV fluids in the ER     and she will get 1 more liter here in the ICU.  We will continue D5     0.45 at 200 mL per hour and insulin drip.  The patient will get an     arterial line to have better assessment of her blood pressure and     for frequent ABGs.  If the patient is persistently hypotensive     despite IV fluids hydration, we will place a central line and we     will start the patient on pressors. 3. The  patient also has history of Addison disease, and we will     continue hydrocortisone stress dose 100 mg q.8 h. 4. Acute renal failure.  Likely secondary to intravascular volume     depletion and dehydration secondary to diabetic ketoacidosis.  The     patient will continue IV fluids hydration and  she will get one more     than liter of normal saline.  Repeat basic metabolic panel showed a     followup creatinine of 0.91 from one point 1.26.  We will follow     electrolytes closely.  The sodium from 128 is now 136. 5. History of Addison disease.  The patient has been treated with low-     dose hydrocortisone and fludrocortisone at home.  We will continue     hydrocortisone at stress dose at 100 mg q.8 h.  Fluids, electrolytes, and nutrition.  The patient will be kept n.p.o. for now.  We will replace electrolytes as needed and we will continue with IV fluid boluses and D5/half normal saline at 200 mL per hour.  DVT and GI prophylaxis.  The patient will be on heparin 5000 units q.8 h.  No GI prophylaxis at this point in time.  CODE STATUS:  Full code.  Total critical care time 45 minutes.     Orbie Hurst, MD     JR/MEDQ  D:  01/05/2011  T:  01/05/2011  Job:  [id number]  Electronically Signed by Rory Percy MD on 02/12/2011 12:46:45 AM

## 2011-03-01 ENCOUNTER — Other Ambulatory Visit (HOSPITAL_COMMUNITY)
Admission: RE | Admit: 2011-03-01 | Discharge: 2011-03-01 | Disposition: A | Discharge status: Home / Self Care | Payer: BC Managed Care – PPO | Source: Ambulatory Visit | Attending: Family Medicine | Admitting: Family Medicine

## 2011-03-01 ENCOUNTER — Other Ambulatory Visit: Payer: Self-pay | Admitting: Family Medicine

## 2011-03-01 DIAGNOSIS — Z124 Encounter for screening for malignant neoplasm of cervix: Secondary | ICD-10-CM | POA: Insufficient documentation

## 2011-03-01 DIAGNOSIS — Z113 Encounter for screening for infections with a predominantly sexual mode of transmission: Secondary | ICD-10-CM | POA: Insufficient documentation

## 2011-03-03 NOTE — H&P (Signed)
[name], [name] NO.:  [id number]  MEDICAL RECORD NO.:  [id number]           PATIENT TYPE:  E  LOCATION:  WLED                         FACILITY:  [facility]  PHYSICIAN:  Baltazar Najjar, MD     DATE OF BIRTH:  March 08, 1981  DATE OF ADMISSION:  01/04/2011 DATE OF DISCHARGE:                             HISTORY & PHYSICAL   PRIMARY CARE PHYSICIAN:  Pam Drown, M.D.  ENDOCRINOLOGIST:  At [facility].  CODE STATUS:  Full code.  CHIEF COMPLAINT:  Generalized weakness, nausea, vomiting.  HISTORY OF PRESENT ILLNESS:  [name] is a 30 year old Caucasian woman with history of type 1 diabetes mellitus, on insulin pump.  She had previous episodes of DKA in the past, was last admitted in April of this year, presented today with chief complaint of nausea, vomiting and generalized weakness.  Her nausea and vomiting had resolved by the time she came to the ER, currently just feels generally weak.  She denies any fever or chills.  She denies any cough or shortness of breath.  She denies any dysuria, increasing urinary frequency or flank pain.  She denies any diarrhea or change in her bowel habits.  In the ER, the patient was found to have glucose more than 600 and her ABG showed a pH of 7.283 and her anion gap was 38 and I was asked to see her and admit her for DKA.  PAST MEDICAL HISTORY: 1. Type 1 diabetes mellitus, on insulin pump. 2. History of Addison disease.  ALLERGIES:  No known drug allergies.  HOME MEDICATIONS:  Medication reconciliation form is still pending at the time of dictation.  However, she is on insulin pump, she is on oral fludrocortisone and oral hydrocortisone and she also takes Zyrtec.  SOCIAL HISTORY:  She lives alone.  She denies smoking or illicit drug use.  States that she drinks alcohol very rarely.  FAMILY HISTORY:  Her older brother had diabetes and grandmother has type 2 diabetes.  REVIEW OF SYSTEMS:  CHEST:  Denies  any cough or shortness of breath. ENT:  She denies any sore throat or any nasal congestion. CARDIOVASCULAR:  Denies any chest pain or palpitations.  ABDOMEN:  She denies any diarrhea, change in bowel habits.  She does have nausea and vomiting.  Denies any abdominal pain.  GU: As above.  Denies any dysuria, increased frequency of urination or flank pain. CONSTITUTIONAL:  She denies any fever or chills.  PHYSICAL EXAMINATION:  VITAL SIGNS:  Blood pressure 96/42, pulse rate of 107, temperature 97.4, O2 sat 100%. GENERAL:  She is alert, in moderate distress. HEENT:  Dry mucous membrane noted. NECK:  Supple.  No JVD. LUNGS: Clear to auscultation bilaterally. CARDIOVASCULAR:  S1, S2 regular; however, tachycardic. ABDOMEN:  Soft, nontender.  Bowel sounds heard normally. EXTREMITIES:  Showed no pedal edema. NEUROLOGICAL EXAM:  She is mildly drowsy, however, able to answer all questions appropriately.  She moves all her extremities and no focal neurological deficit appreciated.  LABORATORY DATA:  Lipase 18, CK-MB 1.1.  Sodium 128, potassium 5.1, chloride 85, CO2 of 10, glucose 635,  BUN 26, creatinine 1.26, alk phos 118, troponin less than 0.3.  WBC 24.9, hemoglobin 15, hematocrit 41.8, platelet count 342.  ABG showed pH of 7.283, pO2 of 124, bicarbonate 6.9, pCO2 6.2, O2 saturation 98%.  Chest x-ray showed no active lung disease.  ASSESSMENT AND PLAN: 1. Diabetic ketoacidosis with anion gap of 38. 2. Poorly controlled type 1 diabetes mellitus, on insulin pump. 3. History of Addison's disease. 4. Leukocytosis. 5. Acute kidney injury secondary to dehydration. 6. Hyponatremia secondary to hyperglycemia, corrected sodium level     136. 7. Mild hyperkalemia.  PLAN: 1. The patient will be admitted to step-down unit.  She will be     aggressively hydrated with half normal saline since her corrected     sodium is normal.  The patient already received 3 L of IV fluids in     the ER.  I will  decrease the rate to 250 cc per hour.  She will be     on insulin drip as per protocol.  Blood glucose level to be checked     every hour and IV fluid to be changed to D5 half NS when CBGs was     less than 200.  Continue with 250 cc per hour.  As far as electrolytes, the patient's potassium currently is more than 5, so she does not need any potassium supplementation at this time. However, we need to monitor electrolytes every 2 hours for the next 2 hours, then every 4 hours and monitor lytes very closely and repeat if the potassium drops with insulin infusion.  We will also monitor venous ABG every 2 hours for 2 hours and then every 4 hours.  Check urine ketones and add on serum ketone level.  For her leukocytosis, there is no evidence of infection at this time. We will check her urinalysis and urine culture as above.  No need for antibiotic at this time.  Will give stress dose of hydrocortisone.  The patient has history of Addison disease and she is on oral hydrocortisone at home.  Since she is on DKA right now, I will give stress dose of hydrocortisone.  Acute kidney injury secondary to dehydration.  Continue with volume resuscitation  Hyponatremia secondary to hyperglycemia.  Corrected sodium level is 136 which is normal.  We will continue with the above protocol.  The patient will be monitored in the step-down unit.  Rest of the orders as per DKA order sheet.  CODE STATUS:  The patient is full code.          ______________________________ Baltazar Najjar, MD     SA/MEDQ  D:  01/04/2011  T:  01/04/2011  Job:  [id number]  Electronically Signed by Hannah Beat MD on 03/03/2011 12:37:26 PM

## 2011-04-26 LAB — URINALYSIS, ROUTINE W REFLEX MICROSCOPIC
Glucose, UA: 500 — AB
Nitrite: NEGATIVE
pH: 6.5

## 2011-04-26 LAB — DIFFERENTIAL
Lymphocytes Relative: 28
Lymphs Abs: 2.4
Monocytes Relative: 5
Neutro Abs: 5.4
Neutrophils Relative %: 64

## 2011-04-26 LAB — CBC
RBC: 5.04
WBC: 8.4

## 2011-04-26 LAB — BASIC METABOLIC PANEL
Calcium: 9
Chloride: 102
Creatinine, Ser: 0.68
GFR calc Af Amer: >60
GFR calc non Af Amer: >60

## 2011-04-26 LAB — URINE MICROSCOPIC-ADD ON

## 2011-04-30 ENCOUNTER — Inpatient Hospital Stay (HOSPITAL_COMMUNITY)
Admission: EM | Admit: 2011-04-30 | Discharge: 2011-05-02 | DRG: 295 | Disposition: A | Discharge status: Home / Self Care | Payer: BC Managed Care – PPO | Attending: Internal Medicine | Admitting: Internal Medicine

## 2011-04-30 DIAGNOSIS — E1065 Type 1 diabetes mellitus with hyperglycemia: Secondary | ICD-10-CM | POA: Diagnosis present

## 2011-04-30 DIAGNOSIS — E2749 Other adrenocortical insufficiency: Secondary | ICD-10-CM | POA: Diagnosis present

## 2011-04-30 DIAGNOSIS — Z794 Long term (current) use of insulin: Secondary | ICD-10-CM

## 2011-04-30 DIAGNOSIS — E875 Hyperkalemia: Secondary | ICD-10-CM | POA: Diagnosis present

## 2011-04-30 DIAGNOSIS — N289 Disorder of kidney and ureter, unspecified: Secondary | ICD-10-CM | POA: Diagnosis present

## 2011-04-30 DIAGNOSIS — E871 Hypo-osmolality and hyponatremia: Secondary | ICD-10-CM | POA: Diagnosis present

## 2011-04-30 DIAGNOSIS — E1010 Type 1 diabetes mellitus with ketoacidosis without coma: Principal | ICD-10-CM | POA: Diagnosis present

## 2011-04-30 DIAGNOSIS — IMO0002 Reserved for concepts with insufficient information to code with codable children: Secondary | ICD-10-CM | POA: Diagnosis present

## 2011-04-30 LAB — BLOOD GAS, VENOUS
Acid-base deficit: 9.7 mmol/L — ABNORMAL HIGH (ref 0.0–2.0)
pCO2, Ven: 36.1 mmHg — ABNORMAL LOW (ref 45.0–50.0)
pH, Ven: 7.272 (ref 7.250–7.300)

## 2011-04-30 LAB — BASIC METABOLIC PANEL
BUN: 20 mg/dL (ref 6–23)
GFR calc Af Amer: >60 mL/min (ref >60)
GFR calc non Af Amer: >60 mL/min (ref >60)
Potassium: 5.8 mEq/L — ABNORMAL HIGH (ref 3.5–5.1)
Sodium: 123 mEq/L — ABNORMAL LOW (ref 135–145)

## 2011-04-30 LAB — DIFFERENTIAL
Basophils Absolute: 0 10*3/uL (ref 0.0–0.1)
Basophils Relative: 0 % (ref 0–1)
Eosinophils Relative: 0 % (ref 0–5)
Lymphocytes Relative: 13 % (ref 12–46)
Monocytes Absolute: 0.7 10*3/uL (ref 0.1–1.0)
Monocytes Relative: 5 % (ref 3–12)

## 2011-04-30 LAB — CBC
HCT: 45.6 % (ref 36.0–46.0)
MCH: 31.4 pg (ref 26.0–34.0)
MCHC: 36.4 g/dL — ABNORMAL HIGH (ref 30.0–36.0)
RDW: 12.4 % (ref 11.5–15.5)

## 2011-05-01 LAB — GLUCOSE, CAPILLARY
Glucose-Capillary: 306 mg/dL — ABNORMAL HIGH (ref 70–99)
Glucose-Capillary: 207 mg/dL — ABNORMAL HIGH (ref 70–99)
Glucose-Capillary: 189 mg/dL — ABNORMAL HIGH (ref 70–99)
Glucose-Capillary: 141 mg/dL — ABNORMAL HIGH (ref 70–99)
Glucose-Capillary: 143 mg/dL — ABNORMAL HIGH (ref 70–99)
Glucose-Capillary: 189 mg/dL — ABNORMAL HIGH (ref 70–99)
Glucose-Capillary: 435 mg/dL — ABNORMAL HIGH (ref 70–99)
Glucose-Capillary: 310 mg/dL — ABNORMAL HIGH (ref 70–99)
Glucose-Capillary: 166 mg/dL — ABNORMAL HIGH (ref 70–99)

## 2011-05-01 LAB — BASIC METABOLIC PANEL
BUN: 11 mg/dL (ref 6–23)
CO2: 20 mEq/L (ref 19–32)
Calcium: 7.7 mg/dL — ABNORMAL LOW (ref 8.4–10.5)
Chloride: 105 mEq/L (ref 96–112)
Creatinine, Ser: 0.55 mg/dL (ref 0.50–1.10)
GFR calc Af Amer: >60 mL/min (ref >60)
GFR calc non Af Amer: >60 mL/min (ref >60)
Glucose, Bld: 159 mg/dL — ABNORMAL HIGH (ref 70–99)
Potassium: 4 mEq/L (ref 3.5–5.1)
Sodium: 132 mEq/L — ABNORMAL LOW (ref 135–145)

## 2011-05-01 LAB — URINALYSIS, ROUTINE W REFLEX MICROSCOPIC
Bilirubin Urine: NEGATIVE
Hgb urine dipstick: NEGATIVE
Specific Gravity, Urine: 1.027 (ref 1.005–1.030)
Urobilinogen, UA: 0.2 mg/dL (ref 0.0–1.0)

## 2011-05-01 LAB — URINE MICROSCOPIC-ADD ON

## 2011-05-02 LAB — GLUCOSE, CAPILLARY
Glucose-Capillary: 100 mg/dL — ABNORMAL HIGH (ref 70–99)
Glucose-Capillary: 33 mg/dL — CL (ref 70–99)
Glucose-Capillary: 262 mg/dL — ABNORMAL HIGH (ref 70–99)

## 2011-05-02 LAB — BASIC METABOLIC PANEL
CO2: 18 mEq/L — ABNORMAL LOW (ref 19–32)
CO2: 23 mEq/L (ref 19–32)
Calcium: 8.4 mg/dL (ref 8.4–10.5)
Calcium: 8.7 mg/dL (ref 8.4–10.5)
Chloride: 106 mEq/L (ref 96–112)
Chloride: 100 mEq/L (ref 96–112)
Creatinine, Ser: 0.64 mg/dL (ref 0.50–1.10)
GFR calc Af Amer: >60 mL/min (ref >60)
Glucose, Bld: 311 mg/dL — ABNORMAL HIGH (ref 70–99)
Sodium: 134 mEq/L — ABNORMAL LOW (ref 135–145)

## 2011-05-14 NOTE — Discharge Summary (Signed)
[name], [name] NO.:  [id number]  MEDICAL RECORD NO.:  [id number]  LOCATION:  1332                         FACILITY:  [facility]  PHYSICIAN:  Marinda Elk, M.D.DATE OF BIRTH:  April 20, 1981  DATE OF ADMISSION:  04/30/2011 DATE OF DISCHARGE:  05/02/2011                              DISCHARGE SUMMARY   PRIMARY CARE DOCTOR:  Dr. Pam Drown, M.D.  ENDOCRINOLOGIST:  At [facility].  DISCHARGE DIAGNOSES: 1. Diabetic ketoacidosis secondary to noncompliance. 2. Adrenal insufficiency. 3. Hyponatremia. 4. Hyperkalemia.  DISCHARGE MEDICATIONS: 1. Florinef 0.1 mg half tab daily, sliding scale insulin. 2. Hydrocortisone 10 mg 1 to 1-1/2 tablet by mouth b.i.d. 3. Lantus 50 units at bedtime. 4. Multivitamin 1 tablet daily. 5. Ropinirole 0.25 mg 1 tablet at bedtime. 6. Visine 1 drop daily. 7. Zoloft 50 mg daily. 8. Zyrtec 10 mg daily.  PROCEDURES PERFORMED:  None.  BRIEF ADMITTING HISTORY AND PHYSICAL:  This is a 30 year old female with past medical history of diabetes type 1.  Used to be on an insulin pump, changed to Lantus and sliding scale due to complication, also history of Addison, on hydrocortisone for nights.  Has not been taking her insulin for the past 2 days secondary to she has not been eating and nauseated. So we were asked to admit and further evaluate.  LABORATORY DATA:  Please refer to dictation from May 01, 2011, for further details.  Labs on admission shows a white count 14, hemoglobin of 16, and platelet count 0.7.  Her bicarb was 16, glucose was 382. Urine pregnancy test was negative.  UA showed no signs infection. Sodium was 123 and potassium 5.8.  HOSPITAL COURSE: 1. DKA.  She was admitted to the Step-Down Unit, was started on IV     insulin.  Her bicarb went above 20.  Her anion gap closed, so she     was changed to Lantus, which she did quite well.  She had an     episode of hypoglycemia overnight.  We think it was because  she     refused her hydrocortisone.  But this was corrected with some sugar     tablets.  Her last blood glucose before discharge was 194.  She was     discharged in stable condition.  We will continue her current home     dose regimen. 2. Renal insufficiency.  No changes were made. 3. Hyponatremia, that is probably secondary to dehydration.  This     resolved with IV fluids. 4. Hypernatremia and hypokalemia that is probably secondary to     acidosis, this corrected.  On day of discharge, her sodium was 132, potassium 3.6, chloride 100, bicarb 23, glucose of 197, BUN of 7, creatinine 0.6, and calcium of 8.7. Vitals on day of discharge show temperature 97, pulse 71, respirations 16, blood pressure 197/67.  She was at 91% on room air.     Marinda Elk, M.D.     AF/MEDQ  D:  05/02/2011  T:  05/02/2011  Job:  [id number]  cc:   Pam Drown, M.D. Fax: [phone]  [facility]  Electronically Signed by Marinda Elk M.D. on  05/14/2011 08:22:37 AM

## 2011-05-21 NOTE — H&P (Signed)
[name], [name] NO.:  [id number]  MEDICAL RECORD NO.:  [id number]  LOCATION:  WLED                         FACILITY:  [facility]  PHYSICIAN:  Houston Siren, MD           DATE OF BIRTH:  06-12-1981  DATE OF ADMISSION:  04/30/2011 DATE OF DISCHARGE:                             HISTORY & PHYSICAL   PRIMARY CARE PHYSICIAN:  Pam Drown, M.D.  ENDOCRINOLOGIST:  At [facility].  CODE STATUS:  Full.  REASON FOR ADMISSION:  DKA.  HISTORY OF PRESENT ILLNESS:  This is a 30 year old female with history of type 1 diabetes, previously on insulin pump, but due to complication was switched over to Lantus 15 units q.h.s., history of Addison disease on hydrocortisone 20 mg b.i.d., presents to the emergency room with lethargy, nausea, and decrease in her appetite.  She has not taken her insulin because she has not been eating.  Evaluation showed a venous gas with pH of 7.27.  She has a white count of 16[address], hemoglobin of 16.6, creatinine of 0.78, bicarb of 16, blood glucose of 382.  Her urinary pregnancy test is negative and urinalysis is negative as well.  She denied any cough, shortness of breath, headache, sore throat, earache or any other symptomatology.  Hospitalist was asked to admit the patient for another episode of DKA.  PAST MEDICAL HISTORY: 1. Type 1 diabetes. 2. History of Addison's disease.  ALLERGIES:  No known drug allergies.  CURRENT MEDICATIONS: 1. 15 units of Lantus q.h.s. 2. Hydrocortisone. 3. Ferritin. 4. Zoloft 75 mg per day.  SOCIAL HISTORY:  She lives alone.  She works for a [employer].  She drinks very little alcohol and denied tobacco or drug use.  FAMILY HISTORY:  One older brother has diabetes.  REVIEW OF SYSTEMS:  Otherwise unremarkable.  PHYSICAL EXAM:  VITAL SIGNS:  Blood pressure 112/88, pulse of 110, respiratory rate of 16, temperature 98.6. GENERAL:  Exam shows she is alert and oriented, and is in no  apparent distress.  No sinus tenderness. NECK:  Supple. HEENT:  Throat is clear.  No stridor.  Sclerae are nonicteric.  Pupils are equal, round and reactive.  She has facial symmetry and fluent speech. CARDIAC:  Exam revealed S1, S2 tachycardia with a soft 1/6 systolic ejection murmur at the left sternal border. LUNGS:  Clear.  No evidence of consolidation. ABDOMEN:  Soft, nondistended, nontender.  Bowel sounds present. EXTREMITIES:  Show no edema.  No calf tenderness.  She has good distal pulses bilaterally. SKIN:  Warm and dry.  OBJECTIVE FINDINGS:  [place] of [address], hemoglobin of 16.6, creatinine 0.78, venous pH 7.27, serum bicarb of 16, blood glucose of 382.  Urinary pregnancy test is negative.  Urinalysis shows no evidence of infection.  Serum sodium 123, potassium is 5.8.  IMPRESSION:  This is a 30 year old with type 1 diabetes and Addison's disease presents once more in diabetic ketoacidosis.  We will admit her to the step-down and utilize the glucose monitor.  We will continue her hydrocortisone.  She is not hypotensive, and we will continue her on the same dose.  She is stable otherwise and  will be admitted to [facility].  There is no evidence of infection.     Houston Siren, MD     PL/MEDQ  D:  05/01/2011  T:  05/01/2011  Job:  [id number]  Electronically Signed by Houston Siren  on 05/21/2011 06:48:01 AM

## 2011-08-30 ENCOUNTER — Inpatient Hospital Stay (HOSPITAL_COMMUNITY)
Admission: EM | Admit: 2011-08-30 | Discharge: 2011-09-03 | DRG: 295 | Disposition: A | Discharge status: Home / Self Care | Payer: BC Managed Care – PPO | Attending: Family Medicine | Admitting: Family Medicine

## 2011-08-30 ENCOUNTER — Encounter (HOSPITAL_COMMUNITY): Payer: Self-pay

## 2011-08-30 DIAGNOSIS — E1010 Type 1 diabetes mellitus with ketoacidosis without coma: Principal | ICD-10-CM | POA: Diagnosis present

## 2011-08-30 DIAGNOSIS — IMO0002 Reserved for concepts with insufficient information to code with codable children: Secondary | ICD-10-CM

## 2011-08-30 DIAGNOSIS — Z794 Long term (current) use of insulin: Secondary | ICD-10-CM

## 2011-08-30 DIAGNOSIS — R112 Nausea with vomiting, unspecified: Secondary | ICD-10-CM | POA: Diagnosis present

## 2011-08-30 DIAGNOSIS — E875 Hyperkalemia: Secondary | ICD-10-CM | POA: Diagnosis present

## 2011-08-30 DIAGNOSIS — E876 Hypokalemia: Secondary | ICD-10-CM

## 2011-08-30 DIAGNOSIS — R1110 Vomiting, unspecified: Secondary | ICD-10-CM

## 2011-08-30 DIAGNOSIS — E1065 Type 1 diabetes mellitus with hyperglycemia: Secondary | ICD-10-CM

## 2011-08-30 DIAGNOSIS — K5289 Other specified noninfective gastroenteritis and colitis: Secondary | ICD-10-CM | POA: Diagnosis present

## 2011-08-30 DIAGNOSIS — R109 Unspecified abdominal pain: Secondary | ICD-10-CM | POA: Diagnosis present

## 2011-08-30 DIAGNOSIS — E2749 Other adrenocortical insufficiency: Secondary | ICD-10-CM | POA: Diagnosis present

## 2011-08-30 LAB — COMPREHENSIVE METABOLIC PANEL
ALT: 23 U/L (ref 0–35)
Alkaline Phosphatase: 142 U/L — ABNORMAL HIGH (ref 39–117)
BUN: 27 mg/dL — ABNORMAL HIGH (ref 6–23)
CO2: 17 mEq/L — ABNORMAL LOW (ref 19–32)
Chloride: 95 mEq/L — ABNORMAL LOW (ref 96–112)
GFR calc Af Amer: >90 mL/min (ref >90)
GFR calc non Af Amer: >90 mL/min (ref >90)
Glucose, Bld: 159 mg/dL — ABNORMAL HIGH (ref 70–99)
Potassium: 3.9 mEq/L (ref 3.5–5.1)
Sodium: 130 mEq/L — ABNORMAL LOW (ref 135–145)
Total Bilirubin: 0.6 mg/dL (ref 0.3–1.2)
Total Protein: 7.9 g/dL (ref 6.0–8.3)

## 2011-08-30 LAB — CBC
Hemoglobin: 17.4 g/dL — ABNORMAL HIGH (ref 12.0–15.0)
MCH: 32.5 pg (ref 26.0–34.0)
MCV: 88.1 fL (ref 78.0–100.0)
Platelets: 320 10*3/uL (ref 150–400)
RBC: 5.36 MIL/uL — ABNORMAL HIGH (ref 3.87–5.11)
WBC: 14.6 10*3/uL — ABNORMAL HIGH (ref 4.0–10.5)

## 2011-08-30 LAB — URINALYSIS, ROUTINE W REFLEX MICROSCOPIC
Ketones, ur: >80 mg/dL — AB
Leukocytes, UA: NEGATIVE
Nitrite: NEGATIVE
Protein, ur: 30 mg/dL — AB
Urobilinogen, UA: 0.2 mg/dL (ref 0.0–1.0)
pH: 5.5 (ref 5.0–8.0)

## 2011-08-30 LAB — BLOOD GAS, ARTERIAL
Bicarbonate: 12.4 mEq/L — ABNORMAL LOW (ref 20.0–24.0)
FIO2: 0.21 %
O2 Saturation: 99.4 %
Patient temperature: 37
TCO2: 11.2 mmol/L (ref 0–100)
pO2, Arterial: 106 mmHg — ABNORMAL HIGH (ref 80.0–100.0)

## 2011-08-30 LAB — DIFFERENTIAL
Eosinophils Absolute: 0.2 10*3/uL (ref 0.0–0.7)
Lymphocytes Relative: 23 % (ref 12–46)
Lymphs Abs: 3.4 10*3/uL (ref 0.7–4.0)
Monocytes Relative: 11 % (ref 3–12)
Neutrophils Relative %: 64 % (ref 43–77)

## 2011-08-30 LAB — POCT I-STAT, CHEM 8
BUN: 28 mg/dL — ABNORMAL HIGH (ref 6–23)
Chloride: 107 mEq/L (ref 96–112)
Glucose, Bld: 144 mg/dL — ABNORMAL HIGH (ref 70–99)
HCT: 48 % — ABNORMAL HIGH (ref 36.0–46.0)
Potassium: 4.3 mEq/L (ref 3.5–5.1)

## 2011-08-30 LAB — URINE MICROSCOPIC-ADD ON

## 2011-08-30 MED ORDER — SODIUM CHLORIDE 0.9 % IV BOLUS (SEPSIS)
1000.0000 mL | Freq: Once | INTRAVENOUS | Status: AC
  Administered 2011-08-30: 1000 mL via INTRAVENOUS

## 2011-08-30 MED ORDER — HYDROCORTISONE SOD SUCCINATE 100 MG IJ SOLR
100.0000 mg | Freq: Once | INTRAMUSCULAR | Status: AC
  Administered 2011-08-30: 100 mg via INTRAVENOUS
  Filled 2011-08-30: qty 2

## 2011-08-30 MED ORDER — HYDROCORTISONE NICU INJ SYRINGE 50 MG/ML: 100.0000 mg | Freq: Once | INTRAVENOUS | Status: DC

## 2011-08-30 MED ORDER — ONDANSETRON HCL 4 MG/2ML IJ SOLN
4.0000 mg | Freq: Once | INTRAMUSCULAR | Status: AC
  Administered 2011-08-30: 4 mg via INTRAVENOUS
  Filled 2011-08-30: qty 2

## 2011-08-30 NOTE — H&P (Signed)
PCP:   MCNEILL,WENDY, MD, MD   Chief Complaint:  Nausea and vomiting  HPI:  Pt is a 31 y/o with addisons disease and DM type 1 that present to the ED with two day complaint of nausea and vomiting.  She states that it started all of a sudden on Wednesday night.  Since occurrence of symptoms she has had several bouts of non bloody emesis of which she states have been around 5-6 times per day. Denies any associated diarrhea or change in bowel movement.  States that due to her emesis she has had poor oral intake and due to nausea/emesis she has not had her oral medications for the last two days.  When asked about her insulin she mentions that she had it at lower doses.  Pt denies being around any sick contacts.  Allergies:  No Known Allergies    Past Medical History  Diagnosis Date  . Diabetes mellitus   . Addison disease     History reviewed. No pertinent past surgical history.  Prior to Admission medications   Medication Sig Start Date End Date Taking? Authorizing Provider  citalopram (CELEXA) 20 MG tablet Take 20 mg by mouth daily.   Yes Historical Provider, MD  fludrocortisone (FLORINEF) 0.1 MG tablet Take 0.1 mg by mouth daily.   Yes Historical Provider, MD  hydrocortisone (CORTEF) 10 MG tablet Take 10-15 mg by mouth See admin instructions. 10 mg every morning and 15 every evening   Yes Historical Provider, MD  insulin glargine (LANTUS) 100 UNIT/ML injection Inject 20 Units into the skin at bedtime.   Yes Historical Provider, MD  insulin lispro (HUMALOG) 100 UNIT/ML injection Inject 5 Units into the skin 3 (three) times daily before meals.   Yes Historical Provider, MD  rOPINIRole (REQUIP) 0.25 MG tablet Take 0.25-0.5 mg by mouth at bedtime.   Yes Historical Provider, MD    Social History:  reports that she has never smoked. She has never used smokeless tobacco. She reports that she drinks alcohol. She reports that she does not use illicit drugs.  History reviewed. No pertinent  family history.  Review of Systems:  Constitutional: Denies fever, chills, diaphoresis, appetite change and fatigue.  HEENT: Denies photophobia, eye pain, redness, hearing loss, ear pain, congestion, sore throat, rhinorrhea, sneezing, mouth sores, trouble swallowing, neck pain, neck stiffness and tinnitus.   Respiratory: Denies SOB, DOE, cough, chest tightness,  and wheezing.   Cardiovascular: Denies chest pain, palpitations and leg swelling.  Gastrointestinal: + nausea, + vomiting, + abdominal pain no voluntary guarding or rebound tenderness, Denies diarrhea, constipation, blood in stool and abdominal distention.  Genitourinary: Denies dysuria, urgency, frequency, hematuria, flank pain and difficulty urinating.  Musculoskeletal: Denies myalgias, back pain, joint swelling, arthralgias and gait problem.  Skin: Denies pallor, rash and wound.  Neurological: Denies dizziness, seizures, syncope, weakness, light-headedness, numbness and headaches.  Hematological: Denies adenopathy. Easy bruising, personal or family bleeding history  Psychiatric/Behavioral: Denies suicidal ideation, mood changes, confusion, nervousness, sleep disturbance and agitation   Physical Exam: Blood pressure 113/72, pulse 112, temperature 97.5 F (36.4 C), temperature source Oral, resp. rate 20, SpO2 100.00%. General: Alert, awake, oriented x3, in no acute distress. HEENT: No bruits, no goiter. Heart: Regular rate and rhythm, without murmurs, rubs, gallops. Lungs: Clear to auscultation bilaterally. Abdomen: Soft, tender on palpation no guading or rebound tenderness, nondistended, positive bowel sounds. Extremities: No clubbing cyanosis or edema with positive pedal pulses. Neuro: Grossly intact, nonfocal.    Labs on Admission:  Results  for orders placed during the hospital encounter of 08/30/11 (from the past 48 hour(s))  CBC     Status: Abnormal   Collection Time   08/30/11  8:35 PM      Component Value Range  Comment   WBC 14.6 (*) 4.0 - 10.5 (K/uL)    RBC 5.36 (*) 3.87 - 5.11 (MIL/uL)    Hemoglobin 17.4 (*) 12.0 - 15.0 (g/dL)    HCT 08.6 (*) 57.8 - 46.0 (%)    MCV 88.1  78.0 - 100.0 (fL)    MCH 32.5  26.0 - 34.0 (pg)    MCHC 36.9 (*) 30.0 - 36.0 (g/dL)    RDW 46.9  62.9 - 52.8 (%)    Platelets 320  150 - 400 (K/uL)   DIFFERENTIAL     Status: Abnormal   Collection Time   08/30/11  8:35 PM      Component Value Range Comment   Neutrophils Relative 64  43 - 77 (%)    Neutro Abs 9.4 (*) 1.7 - 7.7 (K/uL)    Lymphocytes Relative 23  12 - 46 (%)    Lymphs Abs 3.4  0.7 - 4.0 (K/uL)    Monocytes Relative 11  3 - 12 (%)    Monocytes Absolute 1.6 (*) 0.1 - 1.0 (K/uL)    Eosinophils Relative 1  0 - 5 (%)    Eosinophils Absolute 0.2  0.0 - 0.7 (K/uL)    Basophils Relative 1  0 - 1 (%)    Basophils Absolute 0.1  0.0 - 0.1 (K/uL)   COMPREHENSIVE METABOLIC PANEL     Status: Abnormal   Collection Time   08/30/11  8:35 PM      Component Value Range Comment   Sodium 130 (*) 135 - 145 (mEq/L)    Potassium 3.9  3.5 - 5.1 (mEq/L)    Chloride 95 (*) 96 - 112 (mEq/L)    CO2 17 (*) 19 - 32 (mEq/L)    Glucose, Bld 159 (*) 70 - 99 (mg/dL)    BUN 27 (*) 6 - 23 (mg/dL)    Creatinine, Ser 4.13  0.50 - 1.10 (mg/dL)    Calcium 9.9  8.4 - 10.5 (mg/dL)    Total Protein 7.9  6.0 - 8.3 (g/dL)    Albumin 3.6  3.5 - 5.2 (g/dL)    AST 24  0 - 37 (U/L)    ALT 23  0 - 35 (U/L)    Alkaline Phosphatase 142 (*) 39 - 117 (U/L)    Total Bilirubin 0.6  0.3 - 1.2 (mg/dL)    GFR calc non Af Amer >90  >90 (mL/min)    GFR calc Af Amer >90  >90 (mL/min)   LIPASE, BLOOD     Status: Normal   Collection Time   08/30/11  8:35 PM      Component Value Range Comment   Lipase 17  11 - 59 (U/L)   URINALYSIS, ROUTINE W REFLEX MICROSCOPIC     Status: Abnormal   Collection Time   08/30/11  8:52 PM      Component Value Range Comment   Color, Urine YELLOW  YELLOW     APPearance CLEAR  CLEAR     Specific Gravity, Urine 1.030  1.005 -  1.030     pH 5.5  5.0 - 8.0     Glucose, UA >1000 (*) NEGATIVE (mg/dL)    Hgb urine dipstick TRACE (*) NEGATIVE     Bilirubin Urine MODERATE (*)  NEGATIVE     Ketones, ur >80 (*) NEGATIVE (mg/dL)    Protein, ur 30 (*) NEGATIVE (mg/dL)    Urobilinogen, UA 0.2  0.0 - 1.0 (mg/dL)    Nitrite NEGATIVE  NEGATIVE     Leukocytes, UA NEGATIVE  NEGATIVE    PREGNANCY, URINE     Status: Normal   Collection Time   08/30/11  8:52 PM      Component Value Range Comment   Preg Test, Ur NEGATIVE  NEGATIVE    URINE MICROSCOPIC-ADD ON     Status: Abnormal   Collection Time   08/30/11  8:52 PM      Component Value Range Comment   RBC / HPF 0-2  <3 (RBC/hpf)    Casts HYALINE CASTS (*) NEGATIVE  GRANULAR CAST   Urine-Other MUCOUS PRESENT     POCT I-STAT, CHEM 8     Status: Abnormal   Collection Time   08/30/11  9:44 PM      Component Value Range Comment   Sodium 134 (*) 135 - 145 (mEq/L)    Potassium 4.3  3.5 - 5.1 (mEq/L)    Chloride 107  96 - 112 (mEq/L)    BUN 28 (*) 6 - 23 (mg/dL)    Creatinine, Ser 8.29  0.50 - 1.10 (mg/dL)    Glucose, Bld 562 (*) 70 - 99 (mg/dL)    Calcium, Ion 1.30  1.12 - 1.32 (mmol/L)    TCO2 18  0 - 100 (mmol/L)    Hemoglobin 16.3 (*) 12.0 - 15.0 (g/dL)    HCT 86.5 (*) 78.4 - 46.0 (%)   BLOOD GAS, ARTERIAL     Status: Abnormal   Collection Time   08/30/11  9:50 PM      Component Value Range Comment   FIO2 0.21      pH, Arterial 7.286 (*) 7.350 - 7.400     pCO2 arterial 26.7 (*) 35.0 - 45.0 (mmHg)    pO2, Arterial 106.0 (*) 80.0 - 100.0 (mmHg)    Bicarbonate 12.4 (*) 20.0 - 24.0 (mEq/L)    TCO2 11.2  0 - 100 (mmol/L)    Acid-base deficit 12.7 (*) 0.0 - 2.0 (mmol/L)    O2 Saturation 99.4      Patient temperature 37.0      Collection site LEFT RADIAL      Drawn by [id number]      Sample type ARTERIAL DRAW      Allens test (pass/fail) PASS  PASS      Radiological Exams on Admission: No results found.  Assessment/Plan 1) Nausea/Emesis: At this point suspect that may be  secondary to viral gastroenteritis.  Given patient's other comorbidities agree with admitting and observing overnight.  Will provide antiemetics and continue to monitor.  Should patient's condition deteriorate would consider further imaging studies. Considering patient was complaining of muscle aches will go ahead and order flu pcr  2) DM 1:  Last blood glucose level 144-155 on BMP.  Pt had glycosuria with ketonuria.  At this juncture will provide IVF hydration.  While patient has poor po intake will plan on covering with SSI and half her dose of lantus as her appetite improve will plan changing regimen.  3) Addisons: Will plan on continuing home regimen.  Metabolic Acidosis with Respiratory compensation: Given ketones in urine patient may have had diabetic ketoacidosis although currently her last two blood sugars have been 144 and 155.  Will plan on administering fluids and continuing home insulin therapy and  reassessing in the am.  Given elevated WBC count will also order and check a lactic acid level.  Although patient doesn't appear to be toxic  Time Spent on Admission: 60 min  Penny Pia [facility] Pager: [phone] 08/30/2011, 11:02 PM

## 2011-08-30 NOTE — ED Notes (Signed)
Per EMS- Patient reports that she has had N/V/D x 2 days. CBG-358. Patient received NS-250 ml.

## 2011-08-30 NOTE — ED Notes (Signed)
Pt. Placed on cardiac monitor per EDP, Zammit

## 2011-08-30 NOTE — ED Provider Notes (Signed)
History     CSN: [id number]  Arrival date & time 08/30/11  2440   First MD Initiated Contact with Patient 08/30/11 2017      Chief Complaint  Patient presents with  . Emesis  . Abdominal Pain    (Consider location/radiation/quality/duration/timing/severity/associated sxs/prior treatment) Patient is a 31 y.o. female presenting with vomiting and abdominal pain. The history is provided by the patient (Patient states that she's been vomiting for 2 days she has not taken any of her medicines he feels weak and had some abdominal cramping.). No language interpreter was used.  Emesis  This is a recurrent problem. The current episode started 2 days ago. The problem occurs 5 to 10 times per day. The problem has not changed since onset.The emesis has an appearance of stomach contents. There has been no fever. Associated symptoms include abdominal pain. Pertinent negatives include no cough, no diarrhea, no fever and no headaches. Risk factors: none.  Abdominal Pain The primary symptoms of the illness include abdominal pain and vomiting. The primary symptoms of the illness do not include fever, fatigue or diarrhea.  Symptoms associated with the illness do not include hematuria, frequency or back pain.    Past Medical History  Diagnosis Date  . Diabetes mellitus   . Addison disease     History reviewed. No pertinent past surgical history.  History reviewed. No pertinent family history.  History  Substance Use Topics  . Smoking status: Never Smoker   . Smokeless tobacco: Never Used  . Alcohol Use: Yes     occasionally    OB History    Grav Para Term Preterm Abortions TAB SAB Ect Mult Living                  Review of Systems  Constitutional: Negative for fever and fatigue.  HENT: Negative for congestion, sinus pressure and ear discharge.   Eyes: Negative for discharge.  Respiratory: Negative for cough.   Cardiovascular: Negative for chest pain.  Gastrointestinal: Positive for  vomiting and abdominal pain. Negative for diarrhea.  Genitourinary: Negative for frequency and hematuria.  Musculoskeletal: Negative for back pain.  Skin: Negative for rash.  Neurological: Negative for seizures and headaches.  Hematological: Negative.   Psychiatric/Behavioral: Negative for hallucinations.    Allergies  Review of patient's allergies indicates no known allergies.  Home Medications   Current Outpatient Rx  Name Route Sig Dispense Refill  . CITALOPRAM HYDROBROMIDE 20 MG PO TABS Oral Take 20 mg by mouth daily.    Marland Kitchen FLUDROCORTISONE ACETATE 0.1 MG PO TABS Oral Take 0.1 mg by mouth daily.    Marland Kitchen HYDROCORTISONE 10 MG PO TABS Oral Take 10-15 mg by mouth See admin instructions. 10 mg every morning and 15 every evening    . INSULIN GLARGINE 100 UNIT/ML [place] SOLN Subcutaneous Inject 20 Units into the skin at bedtime.    . INSULIN LISPRO (HUMAN) 100 UNIT/ML [place] SOLN Subcutaneous Inject 5 Units into the skin 3 (three) times daily before meals.    Marland Kitchen ROPINIROLE HCL 0.25 MG PO TABS Oral Take 0.25-0.5 mg by mouth at bedtime.      BP 113/72  Pulse 112  Temp(Src) 97.5 F (36.4 C) (Oral)  Resp 20  SpO2 100%  Physical Exam  Constitutional: She is oriented to person, place, and time. She appears well-developed.  HENT:  Head: Normocephalic and atraumatic.  Eyes: Conjunctivae and EOM are normal. No scleral icterus.  Neck: Neck supple. No thyromegaly present.  Cardiovascular: Normal rate  and regular rhythm.  Exam reveals no gallop and no friction rub.   No murmur heard. Pulmonary/Chest: No stridor. She has no wheezes. She has no rales. She exhibits no tenderness.  Abdominal: She exhibits no distension. There is tenderness. There is no rebound.       Minor tendernous abd  Musculoskeletal: Normal range of motion. She exhibits no edema.  Lymphadenopathy:    She has no cervical adenopathy.  Neurological: She is oriented to person, place, and time. Coordination normal.  Skin: No rash noted.  No erythema.  Psychiatric: She has a normal mood and affect. Her behavior is normal.    ED Course  Procedures (including critical care time)  Labs Reviewed  CBC - Abnormal; Notable for the following:    WBC 14.6 (*)    RBC 5.36 (*)    Hemoglobin 17.4 (*)    HCT 47.2 (*)    MCHC 36.9 (*)    All other components within normal limits  DIFFERENTIAL - Abnormal; Notable for the following:    Neutro Abs 9.4 (*)    Monocytes Absolute 1.6 (*)    All other components within normal limits  COMPREHENSIVE METABOLIC PANEL - Abnormal; Notable for the following:    Sodium 130 (*)    Chloride 95 (*)    CO2 17 (*)    Glucose, Bld 159 (*)    BUN 27 (*)    Alkaline Phosphatase 142 (*)    All other components within normal limits  URINALYSIS, ROUTINE W REFLEX MICROSCOPIC - Abnormal; Notable for the following:    Glucose, UA >1000 (*)    Hgb urine dipstick TRACE (*)    Bilirubin Urine MODERATE (*)    Ketones, ur >80 (*)    Protein, ur 30 (*)    All other components within normal limits  BLOOD GAS, ARTERIAL - Abnormal; Notable for the following:    pH, Arterial 7.286 (*)    pCO2 arterial 26.7 (*)    pO2, Arterial 106.0 (*)    Bicarbonate 12.4 (*)    Acid-base deficit 12.7 (*)    All other components within normal limits  POCT I-STAT, CHEM 8 - Abnormal; Notable for the following:    Sodium 134 (*)    BUN 28 (*)    Glucose, Bld 144 (*)    Hemoglobin 16.3 (*)    HCT 48.0 (*)    All other components within normal limits  URINE MICROSCOPIC-ADD ON - Abnormal; Notable for the following:    Casts HYALINE CASTS (*) GRANULAR CAST   All other components within normal limits  PREGNANCY, URINE  LIPASE, BLOOD  I-STAT, CHEM 8   No results found.   1. Vomiting    dehydration   MDM   vomiting,  Gastritis,  Gastroparesis,         Benny Lennert, MD 08/30/11 2303

## 2011-08-30 NOTE — ED Notes (Signed)
AVW:[id number]<XB> Expected date:08/30/11<BR> Expected time: 6:10 PM<BR> Means of arrival:Ambulance<BR> Comments:<BR> EMS 30 GC, 30 yof n/v/weakness/hypotensive

## 2011-08-31 ENCOUNTER — Encounter (HOSPITAL_COMMUNITY): Payer: Self-pay | Admitting: *Deleted

## 2011-08-31 LAB — BASIC METABOLIC PANEL
BUN: 16 mg/dL (ref 6–23)
BUN: 11 mg/dL (ref 6–23)
CO2: 8 mEq/L — CL (ref 19–32)
CO2: 7 mEq/L — CL (ref 19–32)
Calcium: 8.4 mg/dL (ref 8.4–10.5)
Chloride: 96 mEq/L (ref 96–112)
Chloride: 101 mEq/L (ref 96–112)
Chloride: 101 mEq/L (ref 96–112)
Creatinine, Ser: 0.67 mg/dL (ref 0.50–1.10)
GFR calc Af Amer: >90 mL/min (ref >90)
GFR calc Af Amer: >90 mL/min (ref >90)
GFR calc Af Amer: >90 mL/min (ref >90)
GFR calc non Af Amer: >90 mL/min (ref >90)
GFR calc non Af Amer: >90 mL/min (ref >90)
GFR calc non Af Amer: >90 mL/min (ref >90)
GFR calc non Af Amer: >90 mL/min (ref >90)
Glucose, Bld: 267 mg/dL — ABNORMAL HIGH (ref 70–99)
Glucose, Bld: 289 mg/dL — ABNORMAL HIGH (ref 70–99)
Glucose, Bld: 146 mg/dL — ABNORMAL HIGH (ref 70–99)
Potassium: 6.6 mEq/L (ref 3.5–5.1)
Potassium: 4.5 mEq/L (ref 3.5–5.1)
Potassium: 4.6 mEq/L (ref 3.5–5.1)
Potassium: 4.3 mEq/L (ref 3.5–5.1)
Sodium: 124 mEq/L — ABNORMAL LOW (ref 135–145)
Sodium: 127 mEq/L — ABNORMAL LOW (ref 135–145)
Sodium: 127 mEq/L — ABNORMAL LOW (ref 135–145)
Sodium: 126 mEq/L — ABNORMAL LOW (ref 135–145)

## 2011-08-31 LAB — BLOOD GAS, ARTERIAL
Bicarbonate: 8.8 mEq/L — ABNORMAL LOW (ref 20.0–24.0)
FIO2: 0.21 %
TCO2: 8.1 mmol/L (ref 0–100)
pCO2 arterial: 22.4 mmHg — ABNORMAL LOW (ref 35.0–45.0)
pH, Arterial: 7.216 — ABNORMAL LOW (ref 7.350–7.400)

## 2011-08-31 LAB — GLUCOSE, CAPILLARY
Glucose-Capillary: 279 mg/dL — ABNORMAL HIGH (ref 70–99)
Glucose-Capillary: 187 mg/dL — ABNORMAL HIGH (ref 70–99)
Glucose-Capillary: 200 mg/dL — ABNORMAL HIGH (ref 70–99)

## 2011-08-31 LAB — LACTIC ACID, PLASMA: Lactic Acid, Venous: 1.6 mmol/L (ref 0.5–2.2)

## 2011-08-31 LAB — CBC
Hemoglobin: 14.7 g/dL (ref 12.0–15.0)
MCH: 32.2 pg (ref 26.0–34.0)
Platelets: 316 10*3/uL (ref 150–400)
RBC: 4.57 MIL/uL (ref 3.87–5.11)
WBC: 14.8 10*3/uL — ABNORMAL HIGH (ref 4.0–10.5)

## 2011-08-31 LAB — HEMOGLOBIN A1C
Hgb A1c MFr Bld: 14.5 % — ABNORMAL HIGH (ref <5.7)
Mean Plasma Glucose: 369 mg/dL — ABNORMAL HIGH (ref <117)

## 2011-08-31 LAB — INFLUENZA PANEL BY PCR (TYPE A & B)
H1N1 flu by pcr: NOT DETECTED
Influenza A By PCR: NEGATIVE
Influenza B By PCR: NEGATIVE

## 2011-08-31 MED ORDER — SODIUM CHLORIDE 0.9 % IV SOLN
INTRAVENOUS | Status: DC
  Administered 2011-08-31 – 2011-09-01 (×2): via INTRAVENOUS

## 2011-08-31 MED ORDER — DEXTROSE 50 % IV SOLN: 25.0000 mL | INTRAVENOUS | Status: DC | PRN

## 2011-08-31 MED ORDER — SODIUM POLYSTYRENE SULFONATE 15 GM/60ML PO SUSP
30.0000 g | Freq: Once | ORAL | Status: DC
  Filled 2011-08-31: qty 120

## 2011-08-31 MED ORDER — HYDROCORTISONE 10 MG PO TABS
10.0000 mg | ORAL_TABLET | ORAL | Status: DC
  Filled 2011-08-31: qty 1

## 2011-08-31 MED ORDER — SODIUM CHLORIDE 0.9 % IV SOLN
1.0000 g | Freq: Once | INTRAVENOUS | Status: AC
  Administered 2011-08-31: 1 g via INTRAVENOUS
  Filled 2011-08-31: qty 10

## 2011-08-31 MED ORDER — CITALOPRAM HYDROBROMIDE 20 MG PO TABS
20.0000 mg | ORAL_TABLET | Freq: Every day | ORAL | Status: DC
  Administered 2011-08-31 – 2011-09-03 (×4): 20 mg via ORAL
  Filled 2011-08-31 (×4): qty 1

## 2011-08-31 MED ORDER — HYDROCORTISONE SOD SUCCINATE 100 MG IJ SOLR
100.0000 mg | Freq: Four times a day (QID) | INTRAMUSCULAR | Status: DC
  Administered 2011-08-31 – 2011-09-01 (×5): 100 mg via INTRAVENOUS
  Filled 2011-08-31 (×8): qty 2

## 2011-08-31 MED ORDER — HEPARIN SODIUM (PORCINE) 5000 UNIT/ML IJ SOLN
5000.0000 [IU] | Freq: Three times a day (TID) | INTRAMUSCULAR | Status: DC
  Administered 2011-08-31 – 2011-09-01 (×5): 5000 [IU] via SUBCUTANEOUS
  Filled 2011-08-31 (×7): qty 1

## 2011-08-31 MED ORDER — HYDROCODONE-ACETAMINOPHEN 5-325 MG PO TABS
1.0000 | ORAL_TABLET | ORAL | Status: DC | PRN
  Administered 2011-08-31 (×2): 2 via ORAL
  Filled 2011-08-31 (×2): qty 2

## 2011-08-31 MED ORDER — INSULIN ASPART 100 UNIT/ML ~~LOC~~ SOLN
8.0000 [IU] | Freq: Once | SUBCUTANEOUS | Status: AC
  Administered 2011-08-31: 8 [IU] via SUBCUTANEOUS

## 2011-08-31 MED ORDER — DEXTROSE-NACL 5-0.45 % IV SOLN
INTRAVENOUS | Status: DC
  Administered 2011-08-31: 18:00:00 via INTRAVENOUS

## 2011-08-31 MED ORDER — SODIUM CHLORIDE 0.9 % IV SOLN
INTRAVENOUS | Status: AC
  Administered 2011-08-31: 12:00:00 via INTRAVENOUS

## 2011-08-31 MED ORDER — ONDANSETRON HCL 4 MG PO TABS: 4.0000 mg | ORAL_TABLET | Freq: Four times a day (QID) | ORAL | Status: DC | PRN

## 2011-08-31 MED ORDER — INSULIN REGULAR BOLUS VIA INFUSION
5.0000 [IU] | Freq: Once | INTRAVENOUS | Status: AC
  Administered 2011-08-31: 5 [IU] via INTRAVENOUS
  Filled 2011-08-31: qty 5

## 2011-08-31 MED ORDER — INSULIN GLARGINE 100 UNIT/ML ~~LOC~~ SOLN: 10.0000 [IU] | Freq: Every day | SUBCUTANEOUS | Status: DC

## 2011-08-31 MED ORDER — ONDANSETRON HCL 4 MG/2ML IJ SOLN
4.0000 mg | Freq: Four times a day (QID) | INTRAMUSCULAR | Status: DC | PRN
  Administered 2011-08-31: 4 mg via INTRAVENOUS
  Filled 2011-08-31: qty 2

## 2011-08-31 MED ORDER — INSULIN ASPART 100 UNIT/ML ~~LOC~~ SOLN: 3.0000 [IU] | Freq: Three times a day (TID) | SUBCUTANEOUS | Status: DC

## 2011-08-31 MED ORDER — SODIUM CHLORIDE 0.9 % IV SOLN
INTRAVENOUS | Status: DC
  Administered 2011-08-31 – 2011-09-01 (×2): via INTRAVENOUS
  Administered 2011-09-01: 1.8 [IU]/h via INTRAVENOUS
  Filled 2011-08-31 (×3): qty 1

## 2011-08-31 MED ORDER — FLUDROCORTISONE ACETATE 0.1 MG PO TABS
0.1000 mg | ORAL_TABLET | Freq: Every day | ORAL | Status: DC
  Administered 2011-08-31 – 2011-09-03 (×4): 0.1 mg via ORAL
  Filled 2011-08-31 (×4): qty 1

## 2011-08-31 MED ORDER — HYDROCORTISONE 10 MG PO TABS
15.0000 mg | ORAL_TABLET | Freq: Every day | ORAL | Status: DC
  Filled 2011-08-31: qty 1.5

## 2011-08-31 MED ORDER — SODIUM CHLORIDE 0.9 % IV SOLN
INTRAVENOUS | Status: DC
  Administered 2011-08-31: 02:00:00 via INTRAVENOUS

## 2011-08-31 MED ORDER — MORPHINE SULFATE 2 MG/ML IJ SOLN: 1.0000 mg | INTRAMUSCULAR | Status: DC | PRN

## 2011-08-31 MED ORDER — INSULIN ASPART 100 UNIT/ML ~~LOC~~ SOLN
0.0000 [IU] | Freq: Three times a day (TID) | SUBCUTANEOUS | Status: DC
  Filled 2011-08-31: qty 1

## 2011-08-31 MED ORDER — INSULIN REGULAR BOLUS VIA INFUSION: 5.0000 [IU] | Freq: Once | INTRAVENOUS | Status: DC

## 2011-08-31 MED ORDER — DEXTROSE 50 % IV SOLN
25.0000 mL | Freq: Once | INTRAVENOUS | Status: AC
  Administered 2011-08-31: 25 mL via INTRAVENOUS
  Filled 2011-08-31: qty 50

## 2011-08-31 MED ORDER — ALBUTEROL SULFATE (5 MG/ML) 0.5% IN NEBU
2.5000 mg | INHALATION_SOLUTION | Freq: Once | RESPIRATORY_TRACT | Status: DC
  Filled 2011-08-31: qty 0.5

## 2011-08-31 NOTE — Progress Notes (Signed)
Pt HR 150 then 139.  Pt maintaining HR 121-128.  MD notified.

## 2011-08-31 NOTE — Progress Notes (Signed)
CRITICAL VALUE ALERT  Critical value received:  Potassium 6.6, CO2 8   Date of notification: 08/31/11  Time of notification:  0747  Critical value read back:yes  Nurse who received alert:  Kathee Delton, RN  MD notified (1st page):  Dr. Irene Limbo  Time of first page:  [phone]  MD notified (2nd page):  Time of second page:  Responding MD:  Dr. Irene Limbo  Time MD responded:  [phone]

## 2011-08-31 NOTE — Progress Notes (Addendum)
PROGRESS NOTE  [name]:[id number] DOB: [birth year] DOA: 08/30/2011 PCP: Gweneth Dimitri, MD, MD  Brief narrative: 31 year old woman presented to the emergency department with sudden onset of vomiting and abdominal pain 2 days ago. Emergency department impression was vomiting, gastritis, gastroparesis.  Past medical history: Diabetes mellitus, Addison's disease  Consultants:  None  Procedures:  None  Antibiotics:  None   Interim History: Chart reviewed in detail.  Subjective: Complains of nausea. Cough. Epigastric abdominal pain. Feels poorly.  Objective: Filed Vitals:   08/30/11 1953 08/31/11 0014 08/31/11 0143 08/31/11 0718  BP: 113/72 109/57 107/68 94/63  Pulse: 112 123 115 99  Temp:  98 F (36.7 C) 98.2 F (36.8 C) 97.3 F (36.3 C)  TempSrc:  Oral Oral Oral  Resp: 20 18 20 20   Height:   5\' 2"  (1.575 m)   Weight:   47.31 kg (104 lb 4.8 oz)   SpO2: 100% 100% 99% 100%    Intake/Output Summary (Last 24 hours) at 08/31/11 0835 Last data filed at 08/31/11 0700  Gross per 24 hour  Intake 689.58 ml  Output      0 ml  Net 689.58 ml    Exam:  General: Appears mildly ill. Calm. Eyes: Pupils equal, round and reactive to light. Normal lids, irises, conjunctiva. ENT: Grossly normal hearing. Normal lips and tongue. Good dentition. Heart and soft palate appear unremarkable. Neck: No lymphadenopathy or masses. No thyromegaly. Cardiovascular: Tachycardic. Regular rhythm. No murmur, rub or gallop. No lower extremity edema. Telemetry: Sinus tachycardia. No arrhythmias. Respiratory: Clear to auscultation bilaterally. No wheezes, rales or rhonchi. Normal respiratory effort. Abdomen: Soft, nondistended. Mild epigastric tenderness. Overall benign on examination. Skin: No rash or lesions seen. No induration seen. Psychiatric: Grossly normal mood and affect. Speech fluent and appropriate. Appears to be mentating normally. Neurologic: Nonfocal.  Data Reviewed: Basic  Metabolic Panel:  Lab 08/31/11 9562 08/30/11 2144 08/30/11 2035  NA 124* 134* 130*  K 6.6* 4.3 --  CL 96 107 95*  CO2 8* -- 17*  GLUCOSE 267* 144* 159*  BUN 17 28* 27*  CREATININE 0.68 0.90 0.83  CALCIUM 8.5 -- 9.9  MG -- -- --  PHOS -- -- --   Liver Function Tests:  Lab 08/30/11 2035  AST 24  ALT 23  ALKPHOS 142*  BILITOT 0.6  PROT 7.9  ALBUMIN 3.6    Lab 08/30/11 2035  LIPASE 17  AMYLASE --   CBC:  Lab 08/31/11 0658 08/30/11 2144 08/30/11 2035  WBC 14.8* -- 14.6*  NEUTROABS -- -- 9.4*  HGB 14.7 16.3* 17.4*  HCT 40.9 48.0* 47.2*  MCV 89.5 -- 88.1  PLT 316 -- 320   CBG:  Lab 08/31/11 0025  GLUCAP 266*    Scheduled Meds:   . citalopram  20 mg Oral Daily  . fludrocortisone  0.1 mg Oral Daily  . heparin  5,000 Units Subcutaneous Q8H  . hydrocortisone  10 mg Oral Q24H  . hydrocortisone  15 mg Oral q1800  . hydrocortisone sod succinate (SOLU-CORTEF) injection  100 mg Intravenous Once  . insulin aspart  0-15 Units Subcutaneous TID WC  . insulin aspart  3 Units Subcutaneous TID WC  . insulin aspart  8 Units Subcutaneous Once  . insulin glargine  10 Units Subcutaneous QHS  . ondansetron  4 mg Intravenous Once  . sodium chloride  1,000 mL Intravenous Once  . sodium chloride  1,000 mL Intravenous Once  . DISCONTD: hydrocortisone sodium succinate  100 mg Intravenous Once  Continuous Infusions:   . sodium chloride 125 mL/hr at 08/31/11 0229     Assessment/Plan: 1. DKA: Inciting event unclear. Patient has been out of her insulin for the last few days however she reports that nausea and vomiting began before she ran out of insulin. May be secondary to noncompliance or acute adrenal crisis. Start insulin infusion. I suspect it will take sometime to clear her acidosis.  2. Hyperkalemia: No mention of hemolysis. Could be secondary to DKA and possibly adrenal crisis. Albuterol nebulization, EKG, D50, insulin bolus, Kayexalate, calcium gluconate. Fortunately  telemetry appears unremarkable this point. 3. Presumed acute adrenal crisis/known Addison's disease: Random cortisol. IV Solu-Cortef. 4. Abdominal pain/nausea/vomiting: Supportive care. Antiemetics. Exam is benign, complete metabolic panel was unremarkable, lipase within normal limits. Gastroenteritis favored although abdominal pain may speech simply secondary to adrenal crisis. Could consider imaging based on her examination at this point hold off. 5. Diabetes mellitus type 1, uncontrolled (hemoglobin A1c 14.5): Patient states that she can afford Lantus. Resume Lantus and meal coverage once DKA resolved. She is in the process of trying to find a new endocrinologist. Once condition is stabilized recommend further education with nutrition consult and inpatient diabetes RN.  Despite significant laboratory abnormalities the patient currently appears nontoxic. I think prompt treatment for DKA and hyperkalemia is likely to rapidly improve her condition. Should her condition change or fails rapidly to improve would consider transfer to step down.  Current acute issues as well as orders discussed with RN in person.  ADDENDUM 1808: Patient has refused several blood draws, despite being told the importance of serial blood work to assess her response to the current treatment. Last BMP showed improvement in hyperkalemia but a lower serum bicarbonate; not surprising given the proximity of this blood draw to the starting of IV insulin. Repeat blood work is now pending.  Exam: Patient assessed at bedside. VSS. Tachycardia seen earlier has resolved. She is oriented to herself, location, month, year and the cause of her hospitalization. Cardiovascular and lung exam remain unremarkable.   Repeat blood work obtain 1740 showed an improvement in her serum bicarbonate. Her blood sugar remains stable. Her exam is unremarkable and her mentation is intact; therefore will continue with current plan. Should her condition change  or fail to progressively improve, would consider transfer to ICU.  Discussed above at bedside with mother and father.   Code Status: Full code.  Family Communication:  Disposition Plan: Home when improved.   Brendia Sacks, MD  [facility] Pager [phone] 08/31/2011, 8:35 AM    LOS: 1 day

## 2011-08-31 NOTE — Progress Notes (Signed)
CRITICAL VALUE ALERT  Critical value received:  CO2 7  Date of notification:  08/31/11  Time of notification:  2240  Critical value read back: yes  Nurse who received alert:  Haskel Khan RN   MD notified (1st page):  Burnadette Peter NP  Time of first page:  [phone]  MD notified (2nd page):  Time of second page:  Responding MD:  Burnadette Peter NP  Time MD responded:  2250

## 2011-08-31 NOTE — Progress Notes (Signed)
CRITICAL VALUE ALERT  Critical value received:  CO2 8  Date of notification:  08/31/11  Time of notification:  1952   Critical value read back: yes  Nurse who received alert:  Haskel Khan RN  MD notified (1st page):  Burnadette Peter  Time of first page:  2020  MD notified (2nd page):  Time of second page:  Responding MD:  Burnadette Peter  Time MD responded:  2025

## 2011-08-31 NOTE — Progress Notes (Signed)
CRITICAL VALUE ALERT  Critical value received:  CO2  3  Date of notification:  08/31/11  Time of notification:  1250  Critical value read back:yes  Nurse who received alert:  Kathee Delton RN  MD notified (1st page):  Dr. Irene Limbo  Time of first page:  1255  MD notified (2nd page):  Time of second page:  Responding MD:  Dr. Irene Limbo  Time MD responded:  [phone]

## 2011-08-31 NOTE — ED Notes (Signed)
Attempted to call report x1 - RN not ready for report at this tim.e

## 2011-09-01 LAB — BASIC METABOLIC PANEL
BUN: 8 mg/dL (ref 6–23)
BUN: 8 mg/dL (ref 6–23)
BUN: 9 mg/dL (ref 6–23)
BUN: 10 mg/dL (ref 6–23)
BUN: 10 mg/dL (ref 6–23)
BUN: 11 mg/dL (ref 6–23)
CO2: 10 mEq/L — CL (ref 19–32)
CO2: 13 mEq/L — ABNORMAL LOW (ref 19–32)
CO2: 10 mEq/L — CL (ref 19–32)
CO2: 11 mEq/L — ABNORMAL LOW (ref 19–32)
CO2: 18 mEq/L — ABNORMAL LOW (ref 19–32)
Calcium: 8.7 mg/dL (ref 8.4–10.5)
Calcium: 8.9 mg/dL (ref 8.4–10.5)
Chloride: 100 mEq/L (ref 96–112)
Chloride: 103 mEq/L (ref 96–112)
Chloride: 105 mEq/L (ref 96–112)
Chloride: 104 mEq/L (ref 96–112)
Chloride: 97 mEq/L (ref 96–112)
Chloride: 99 mEq/L (ref 96–112)
Chloride: 99 mEq/L (ref 96–112)
Creatinine, Ser: 0.53 mg/dL (ref 0.50–1.10)
Creatinine, Ser: 0.49 mg/dL — ABNORMAL LOW (ref 0.50–1.10)
Creatinine, Ser: 0.56 mg/dL (ref 0.50–1.10)
Creatinine, Ser: 0.54 mg/dL (ref 0.50–1.10)
GFR calc Af Amer: >90 mL/min (ref >90)
GFR calc Af Amer: >90 mL/min (ref >90)
GFR calc Af Amer: >90 mL/min (ref >90)
GFR calc Af Amer: >90 mL/min (ref >90)
GFR calc non Af Amer: >90 mL/min (ref >90)
GFR calc non Af Amer: >90 mL/min (ref >90)
Glucose, Bld: 193 mg/dL — ABNORMAL HIGH (ref 70–99)
Glucose, Bld: 166 mg/dL — ABNORMAL HIGH (ref 70–99)
Glucose, Bld: 145 mg/dL — ABNORMAL HIGH (ref 70–99)
Glucose, Bld: 124 mg/dL — ABNORMAL HIGH (ref 70–99)
Glucose, Bld: 242 mg/dL — ABNORMAL HIGH (ref 70–99)
Potassium: 3.7 mEq/L (ref 3.5–5.1)
Potassium: 3.4 mEq/L — ABNORMAL LOW (ref 3.5–5.1)
Potassium: 3.2 mEq/L — ABNORMAL LOW (ref 3.5–5.1)
Potassium: 5.1 mEq/L (ref 3.5–5.1)
Potassium: 3.6 mEq/L (ref 3.5–5.1)
Potassium: 3.3 mEq/L — ABNORMAL LOW (ref 3.5–5.1)
Sodium: 127 mEq/L — ABNORMAL LOW (ref 135–145)
Sodium: 128 mEq/L — ABNORMAL LOW (ref 135–145)
Sodium: 130 mEq/L — ABNORMAL LOW (ref 135–145)

## 2011-09-01 LAB — GLUCOSE, CAPILLARY
Glucose-Capillary: 124 mg/dL — ABNORMAL HIGH (ref 70–99)
Glucose-Capillary: 127 mg/dL — ABNORMAL HIGH (ref 70–99)
Glucose-Capillary: 141 mg/dL — ABNORMAL HIGH (ref 70–99)
Glucose-Capillary: 145 mg/dL — ABNORMAL HIGH (ref 70–99)
Glucose-Capillary: 156 mg/dL — ABNORMAL HIGH (ref 70–99)
Glucose-Capillary: 157 mg/dL — ABNORMAL HIGH (ref 70–99)
Glucose-Capillary: 206 mg/dL — ABNORMAL HIGH (ref 70–99)
Glucose-Capillary: 207 mg/dL — ABNORMAL HIGH (ref 70–99)
Glucose-Capillary: 209 mg/dL — ABNORMAL HIGH (ref 70–99)
Glucose-Capillary: 260 mg/dL — ABNORMAL HIGH (ref 70–99)
Glucose-Capillary: 82 mg/dL (ref 70–99)
Glucose-Capillary: 85 mg/dL (ref 70–99)

## 2011-09-01 LAB — BASIC METABOLIC PANEL WITH GFR
BUN: 8 mg/dL (ref 6–23)
CO2: 12 meq/L — ABNORMAL LOW (ref 19–32)
Calcium: 8.4 mg/dL (ref 8.4–10.5)
Chloride: 104 meq/L (ref 96–112)
Creatinine, Ser: 0.53 mg/dL (ref 0.50–1.10)
GFR calc Af Amer: >90 mL/min
GFR calc non Af Amer: >90 mL/min
Glucose, Bld: 157 mg/dL — ABNORMAL HIGH (ref 70–99)
Potassium: 3.3 meq/L — ABNORMAL LOW (ref 3.5–5.1)
Sodium: 128 meq/L — ABNORMAL LOW (ref 135–145)

## 2011-09-01 MED ORDER — POTASSIUM CHLORIDE 10 MEQ/100ML IV SOLN
10.0000 meq | INTRAVENOUS | Status: AC
  Administered 2011-09-02 (×4): 10 meq via INTRAVENOUS
  Filled 2011-09-01 (×4): qty 100

## 2011-09-01 MED ORDER — SODIUM POLYSTYRENE SULFONATE 15 GM/60ML PO SUSP
15.0000 g | Freq: Two times a day (BID) | ORAL | Status: DC
  Filled 2011-09-01 (×2): qty 60

## 2011-09-01 MED ORDER — POTASSIUM CHLORIDE CRYS ER 20 MEQ PO TBCR
30.0000 meq | EXTENDED_RELEASE_TABLET | ORAL | Status: AC
  Administered 2011-09-02 (×2): 30 meq via ORAL
  Filled 2011-09-01 (×2): qty 1

## 2011-09-01 MED ORDER — SODIUM BICARBONATE 8.4 % IV SOLN
INTRAVENOUS | Status: DC
  Administered 2011-09-01 (×2): via INTRAVENOUS
  Filled 2011-09-01 (×6): qty 100

## 2011-09-01 MED ORDER — ALUM & MAG HYDROXIDE-SIMETH 200-200-20 MG/5ML PO SUSP
30.0000 mL | ORAL | Status: DC | PRN
  Administered 2011-09-01 – 2011-09-02 (×2): 30 mL via ORAL
  Filled 2011-09-01 (×2): qty 30

## 2011-09-01 MED ORDER — HYDROCORTISONE SOD SUCCINATE 100 MG IJ SOLR
50.0000 mg | Freq: Three times a day (TID) | INTRAMUSCULAR | Status: DC
  Administered 2011-09-01 – 2011-09-02 (×2): 50 mg via INTRAVENOUS
  Filled 2011-09-01 (×5): qty 1

## 2011-09-01 NOTE — Progress Notes (Signed)
PROGRESS NOTE  [name] ZOX:[id number] DOB: August 27, 1980 DOA: 08/30/2011 PCP: Gweneth Dimitri, MD, MD  Brief narrative: 31 year old woman presented to the emergency department with sudden onset of vomiting and abdominal pain 2 days ago. Emergency department impression was vomiting, gastritis, gastroparesis.   Past medical history: Diabetes mellitus, Addison's disease  Consultants:  None  Procedures:  None  Antibiotics:  None   Subjective: Felt a little heartburn today.  After eating the grapes and juice.  Pain is in the center of the chest and doesn't go anywhere else.  Has just had it for an hour and a half.  No pain in arms or neck.  Didn't feel like she wanted to vomit due to diet   Objective: Filed Vitals:   08/31/11 2116 09/01/11 0158 09/01/11 0612 09/01/11 1000  BP: 107/68 107/64 114/76 102/69  Pulse: 100 94 88 89  Temp: 98.6 F (37 C)  98.1 F (36.7 C) 97.9 F (36.6 C)  TempSrc: Oral  Oral Oral  Resp: 20 16 17 20   Height:      Weight:   48.081 kg (106 lb)   SpO2: 100% 99% 100% 100%    Intake/Output Summary (Last 24 hours) at 09/01/11 1247 Last data filed at 09/01/11 0500  Gross per 24 hour  Intake   1800 ml  Output      0 ml  Net   1800 ml    Exam:  General: Appears mildly ill. Calm. Eyes: Pupils equal, round and reactive to light. Normal lids, irises, conjunctiva. ENT: Grossly normal hearing. Normal lips and tongue. Good dentition. Heart and soft palate appear unremarkable.  Has a scalp lesion that is 3 cm round, but dry and non-infected appearing Neck: No lymphadenopathy or masses. No thyromegaly. Cardiovascular: Tachycardic. Regular rhythm. No murmur, rub or gallop. No lower extremity edema. Telemetry: Sinus tachycardia. No arrhythmias.  Tachycardic to the 90's Respiratory: Clear to auscultation bilaterally. No wheezes, rales or rhonchi. Normal respiratory effort. Abdomen: Soft, nondistended. Mild epigastric tenderness. Overall benign on  examination. Skin: No rash or lesions seen. No induration seen. Psychiatric: Grossly normal mood and affect. Speech fluent and appropriate. Appears to be mentating normally. Neurologic: Nonfocal.  Data Reviewed: Basic Metabolic Panel:  Lab 09/01/11 8119 09/01/11 0819 09/01/11 0545 09/01/11 0344 09/01/11 0200  NA 130* 128* 128* 128* 128*  K 5.1 3.2* -- -- --  CL 106 104 105 104 103  CO2 7* 13* 13* 12* 12*  GLUCOSE 137* 124* 145* 157* 166*  BUN 10 9 8 8 8   CREATININE 0.51 0.49* 0.53 0.53 0.55  CALCIUM 8.9 8.5 8.5 8.4 8.6  MG -- -- -- -- --  PHOS -- -- -- -- --   Liver Function Tests:  Lab 08/30/11 2035  AST 24  ALT 23  ALKPHOS 142*  BILITOT 0.6  PROT 7.9  ALBUMIN 3.6    Lab 08/30/11 2035  LIPASE 17  AMYLASE --   CBC:  Lab 08/31/11 0658 08/30/11 2144 08/30/11 2035  WBC 14.8* -- 14.6*  NEUTROABS -- -- 9.4*  HGB 14.7 16.3* 17.4*  HCT 40.9 48.0* 47.2*  MCV 89.5 -- 88.1  PLT 316 -- 320   CBG:  Lab 09/01/11 1215 09/01/11 1109 09/01/11 0954 09/01/11 0855 09/01/11 0802  GLUCAP 300* 206* 120* 120* 126*    Scheduled Meds:    . albuterol  2.5 mg Nebulization Once  . citalopram  20 mg Oral Daily  . fludrocortisone  0.1 mg Oral Daily  . heparin  5,000 Units  Subcutaneous Q8H  . hydrocortisone sod succinate (SOLU-CORTEF) injection  100 mg Intravenous Q6H  . sodium polystyrene  30 g Oral Once   Continuous Infusions:    . sodium chloride 150 mL/hr at 09/01/11 1012  . dextrose 5 % and 0.45% NaCl 125 mL/hr at 08/31/11 1809  . insulin (NOVOLIN-R) infusion       Assessment/Plan: 1. DKA: Inciting event unclear. Patient has been out of her insulin for the last few days however she reports that nausea and vomiting began before she ran out of insulin.  Admit to not usual stringent control of Diabetes.  Blood sugars now elevated slightly 2/2 to possible PO intake, on D5 still and Likely exacerbation by Stress dose steroids.  See below. 2. Hyperkalemia: No mention of  hemolysis. Could be secondary to DKA and possibly adrenal crisis.  K elevated once again this am.  Rpt EKG-Would continue Rx with Calcium gluconate-RPt EKG shows no  3. Presumed acute adrenal crisis/known Addison's disease: Random cortisol. IV Solu-Cortef top be weaned down to half of currecnt dosage 4. Abdominal pain/nausea/vomiting: Supportive care. Antiemetics. Exam is benign, complete metabolic panel was unremarkable, lipase within normal limits. Gastroenteritis favored although abdominal pain may besecondary to adrenal crisis. Would get an MRI to delineate Abdomen if she has recurrent pain-also to visualize Adrenals/Pancreas. 5. AKI Presented with Classical Vol depletion.  Although this has resolved with IV fluid repletion, She maintains a very low Bicarb.  Her Anion Gap is 130-(106+7)=17, which.  I discussed her care with Dr. Allena Katz of Nephrology who suggests we might be able to change her IVF from D5/NS to D/5 with Bicarbonate, as this would help correct the acidosis-will get a venous blood gas as well as Bmets now q4 hrly, and will reassess her case. 6. Diabetes mellitus type 1, uncontrolled (hemoglobin A1c 14.5): Patient states that she can afford Lantus. Resume Lantus and meal coverage once DKA resolved. She is in the process of trying to find a new endocrinologist-We may involve an endocrinologist  Despite significant laboratory abnormalities the patient currently appears nontoxic. I think prompt treatment for DKA and hyperkalemia will eventuall change her  Should her condition change or fails rapidly to improve would consider transfer to step down.  Current acute issues as well as orders discussed with RN in person/family at bedside   Code Status: Full code.  Family Communication:  Fully discussed with family cours eof care.  Explaine dduration and length of purported stay andalso POC.  Will likely need Endocrinology input if her Labs do not seem to turn the corncer over next 12-18  hours Disposition Plan: Home when improved.   Brendia Sacks, MD  [facility] Pager [phone] 09/01/2011, 12:47 PM    LOS: 2 days

## 2011-09-01 NOTE — Progress Notes (Signed)
CRITICAL VALUE ALERT  Critical value received:  CO2 10   Date of notification:  09/01/11  Time of notification: 0120  Critical value read back: yes  Nurse who received alert:  Haskel Khan RN  MD notified (1st page):  Burnadette Peter NP  Time of first page:  0121  MD notified (2nd page):  Time of second page:  Responding MD:  Burnadette Peter NP  Time MD responded:  [phone]

## 2011-09-02 LAB — GLUCOSE, CAPILLARY
Glucose-Capillary: 177 mg/dL — ABNORMAL HIGH (ref 70–99)
Glucose-Capillary: 200 mg/dL — ABNORMAL HIGH (ref 70–99)
Glucose-Capillary: 254 mg/dL — ABNORMAL HIGH (ref 70–99)
Glucose-Capillary: 264 mg/dL — ABNORMAL HIGH (ref 70–99)
Glucose-Capillary: 318 mg/dL — ABNORMAL HIGH (ref 70–99)
Glucose-Capillary: 521 mg/dL — ABNORMAL HIGH (ref 70–99)
Glucose-Capillary: 541 mg/dL — ABNORMAL HIGH (ref 70–99)
Glucose-Capillary: 581 mg/dL (ref 70–99)
Glucose-Capillary: 96 mg/dL (ref 70–99)
Glucose-Capillary: >600 mg/dL (ref 70–99)

## 2011-09-02 LAB — BASIC METABOLIC PANEL
BUN: 9 mg/dL (ref 6–23)
CO2: 20 mEq/L (ref 19–32)
CO2: 22 mEq/L (ref 19–32)
Calcium: 8.8 mg/dL (ref 8.4–10.5)
Chloride: 96 mEq/L (ref 96–112)
GFR calc Af Amer: >90 mL/min (ref >90)
GFR calc non Af Amer: >90 mL/min (ref >90)
Glucose, Bld: 226 mg/dL — ABNORMAL HIGH (ref 70–99)
Potassium: 3.9 mEq/L (ref 3.5–5.1)
Sodium: 130 mEq/L — ABNORMAL LOW (ref 135–145)
Sodium: 128 mEq/L — ABNORMAL LOW (ref 135–145)

## 2011-09-02 LAB — BLOOD GAS, VENOUS
Bicarbonate: 13.1 mEq/L — ABNORMAL LOW (ref 20.0–24.0)
FIO2: 0.21 %
Patient temperature: 98.6
pH, Ven: 7.35 — ABNORMAL HIGH (ref 7.250–7.300)

## 2011-09-02 LAB — OSMOLALITY: Osmolality: 276 mOsm/kg (ref 275–300)

## 2011-09-02 MED ORDER — INSULIN GLARGINE 100 UNIT/ML ~~LOC~~ SOLN
15.0000 [IU] | Freq: Every day | SUBCUTANEOUS | Status: DC
  Administered 2011-09-02: 15 [IU] via SUBCUTANEOUS

## 2011-09-02 MED ORDER — INSULIN ASPART 100 UNIT/ML ~~LOC~~ SOLN
4.0000 [IU] | Freq: Three times a day (TID) | SUBCUTANEOUS | Status: DC
  Administered 2011-09-02 – 2011-09-03 (×4): 4 [IU] via SUBCUTANEOUS

## 2011-09-02 MED ORDER — INSULIN GLARGINE 100 UNIT/ML ~~LOC~~ SOLN
5.0000 [IU] | Freq: Every day | SUBCUTANEOUS | Status: DC
  Filled 2011-09-02: qty 3

## 2011-09-02 MED ORDER — INSULIN ASPART 100 UNIT/ML ~~LOC~~ SOLN
7.0000 [IU] | Freq: Once | SUBCUTANEOUS | Status: AC
  Administered 2011-09-02: 7 [IU] via SUBCUTANEOUS
  Filled 2011-09-02: qty 3

## 2011-09-02 MED ORDER — HYDROCORTISONE 10 MG PO TABS
15.0000 mg | ORAL_TABLET | Freq: Every day | ORAL | Status: DC
  Administered 2011-09-02: 15 mg via ORAL
  Filled 2011-09-02 (×2): qty 1

## 2011-09-02 MED ORDER — INSULIN ASPART 100 UNIT/ML ~~LOC~~ SOLN
1.0000 [IU] | Freq: Once | SUBCUTANEOUS | Status: AC
  Administered 2011-09-02: 15 [IU] via SUBCUTANEOUS

## 2011-09-02 MED ORDER — INSULIN GLARGINE 100 UNIT/ML ~~LOC~~ SOLN
10.0000 [IU] | Freq: Every day | SUBCUTANEOUS | Status: DC
  Administered 2011-09-02: 10 [IU] via SUBCUTANEOUS

## 2011-09-02 MED ORDER — INSULIN ASPART 100 UNIT/ML ~~LOC~~ SOLN: 0.0000 [IU] | Freq: Every day | SUBCUTANEOUS | Status: DC

## 2011-09-02 MED ORDER — INSULIN ASPART 100 UNIT/ML ~~LOC~~ SOLN
20.0000 [IU] | Freq: Once | SUBCUTANEOUS | Status: AC
  Administered 2011-09-02: 20 [IU] via SUBCUTANEOUS

## 2011-09-02 MED ORDER — INSULIN ASPART 100 UNIT/ML ~~LOC~~ SOLN
0.0000 [IU] | Freq: Three times a day (TID) | SUBCUTANEOUS | Status: DC
  Administered 2011-09-02: 15 [IU] via SUBCUTANEOUS
  Administered 2011-09-02 (×2): 8 [IU] via SUBCUTANEOUS
  Administered 2011-09-03: 11 [IU] via SUBCUTANEOUS

## 2011-09-02 NOTE — Progress Notes (Signed)
Pt cgb 581, results called to MD and orders received.  Will continue to monitor.

## 2011-09-02 NOTE — Progress Notes (Signed)
Pt CBG 521. Results called to Dr Mahala Menghini and order received.

## 2011-09-02 NOTE — Progress Notes (Signed)
Pt well known to the Inpatient DM Program.  Have worked with this patient in the past on several occasions.  Admitted with DKA.    Went in to speak with this patient about her A1C of 14.5% (08/31/11).  Pt became tearful and told me she has been seeing a physician in [place], however, she felt like she "could not please that physician".  Ms. Igoe told me after her visit with this MD in September, she felt discouraged and quit checking her CBGs and she also quit following her prescribed insulin regimen.  Pt told me she would like to find a new Endocrinologist and that knows what she needs to do to have better CBG control.  Pt also told me she would like to have her Humalog Rx changed to vial and syringe.  Currently she uses Lantus via a vial and prefers the vial over the insulin pens.  Reviewed proper insulin pen use with patient.  Pt told me she would just prefer insulin vials.  Discussed the importance of good CBG control with pt.  Pt again became tearful and told me she just would feel better with a different physician.  Gave pt several names of local endocrinologists in [place].    Noted pt transitioned off IV insulin drip this morning.  Home insulin regimen includes: Lantus 20 units QHS plus Humalog with meals.  Pt received 10 units Lantus at noon and will get 10 units Lantus at bedtime tonight.  Can switch her to Lantus 20 units QHS tomorrow evening.  Will follow and assist as needed. Ambrose Finland RN, MSN, CDE Diabetes Coordinator Inpatient Diabetes Program [phone]

## 2011-09-02 NOTE — Progress Notes (Signed)
PROGRESS NOTE  [name] ZOX:[id number] DOB: Aug 21, 1980 DOA: 08/30/2011 PCP: Gweneth Dimitri, MD, MD  Brief narrative: 31 year old woman presented to the emergency department with sudden onset of vomiting and abdominal pain 2 days ago. Emergency department impression was vomiting, gastritis, gastroparesis.   Past medical history: Diabetes mellitus, Addison's disease  Consultants:  None  Procedures:  None  Antibiotics:  None   Subjective: Feels much better no specific complaints. Tolerating by mouth. No abdominal pain no chest pain no shortness of breath no polyuria  Interval history-blood sugar is still remaining elevated, insulin drip to see this morning bicarbonate drip DC'd this morning- noted diabetic coordinator's notes   Objective: Filed Vitals:   09/01/11 2143 09/02/11 0215 09/02/11 0640 09/02/11 1501  BP: 115/73 120/79 109/66 119/77  Pulse: 79 76 79 82  Temp: 98.3 F (36.8 C) 98.5 F (36.9 C) 98.4 F (36.9 C) 98.4 F (36.9 C)  TempSrc: Oral Oral Oral Oral  Resp: 18 20 16 18   Height:      Weight:   52.799 kg (116 lb 6.4 oz)   SpO2: 100% 98% 97% 100%    Intake/Output Summary (Last 24 hours) at 09/02/11 1511 Last data filed at 09/02/11 1500  Gross per 24 hour  Intake   2340 ml  Output    350 ml  Net   1990 ml    Exam:  General: Appears mildly ill. Calm. Eyes: Pupils equal, round and reactive to light. Normal lids, irises, conjunctiva. ENT: Grossly normal hearing. Normal lips and tongue. Good dentition. Heart and soft palate appear unremarkable.  Has a scalp lesion that is 3 cm round, but dry and non-infected appearing Neck: No lymphadenopathy or masses. No thyromegaly. Cardiovascular: Tachycardic. Regular rhythm. No murmur, rub or gallop. No lower extremity edema. Telemetry: Sinus tachycardia. No arrhythmias.  Tachycardic to the 90's Respiratory: Clear to auscultation bilaterally. No wheezes, rales or rhonchi. Normal respiratory effort. Abdomen:  Soft, nondistended. Mild epigastric tenderness. Overall benign on examination. Skin: No rash or lesions seen. No induration seen. Psychiatric: Grossly normal mood and affect. Speech fluent and appropriate. Appears to be mentating normally. Neurologic: Nonfocal.  Data Reviewed: Basic Metabolic Panel:  Lab 09/02/11 8119 09/02/11 0439 09/01/11 2025 09/01/11 1434 09/01/11 1228  NA 128* 130* 128* 126* 125*  K 3.9 4.1 -- -- --  CL 96 100 99 99 97  CO2 22 20 18* 11* 10*  GLUCOSE 226* 102* 153* 242* 296*  BUN 9 10 11 10 10   CREATININE 0.61 0.51 0.54 0.56 0.56  CALCIUM 8.9 8.8 9.0 8.5 8.5  MG -- -- -- -- --  PHOS -- -- -- -- --   Liver Function Tests:  Lab 08/30/11 2035  AST 24  ALT 23  ALKPHOS 142*  BILITOT 0.6  PROT 7.9  ALBUMIN 3.6    Lab 08/30/11 2035  LIPASE 17  AMYLASE --   CBC:  Lab 08/31/11 0658 08/30/11 2144 08/30/11 2035  WBC 14.8* -- 14.6*  NEUTROABS -- -- 9.4*  HGB 14.7 16.3* 17.4*  HCT 40.9 48.0* 47.2*  MCV 89.5 -- 88.1  PLT 316 -- 320   CBG:  Lab 09/02/11 1204 09/02/11 1039 09/02/11 0839 09/02/11 0706 09/02/11 0606  GLUCAP 254* 318* 253* 145* 130*    Scheduled Meds:    . albuterol  2.5 mg Nebulization Once  . citalopram  20 mg Oral Daily  . fludrocortisone  0.1 mg Oral Daily  . hydrocortisone  15 mg Oral q1800  . insulin aspart  0-15  Units Subcutaneous TID WC  . insulin aspart  0-5 Units Subcutaneous QHS  . insulin aspart  4 Units Subcutaneous TID WC  . insulin aspart  7 Units Subcutaneous Once  . insulin glargine  10 Units Subcutaneous QHS  . potassium chloride  30 mEq Oral Q4H  . potassium chloride  10 mEq Intravenous Q1 Hr x 4  . DISCONTD: hydrocortisone sod succinate (SOLU-CORTEF) injection  50 mg Intravenous Q8H  . DISCONTD: insulin glargine  5 Units Subcutaneous QHS   Continuous Infusions:    . DISCONTD: insulin (NOVOLIN-R) infusion 3.4 Units/hr (09/02/11 0707)  . DISCONTD:  sodium bicarbonate infusion 125 mL/hr at 09/01/11  2234     Assessment/Plan: 1. DKA: Inciting event unclear. Patient has been out of her insulin for the last few days however she reports that nausea and vomiting began before she ran out of insulin.  Admit to not usual stringent control of Diabetes.  Anion gap closed and bicarbonate now at 22. Transition to regular home insulin 2. Hyperkalemia: Lab  3. Presumed acute adrenal crisis/known Addison's disease: Random cortisol. IV Solu-Cortef taper to home dose of regular steroids 4. Abdominal pain/nausea/vomiting: Supportive care. Antiemetics. Exam is benign, complete metabolic panel was unremarkable, lipase within normal limits. Gastroenteritis favored although abdominal pain may besecondary to adrenal crisis. Would get an MRI to delineate Abdomen if she has recurrent pain-also to visualize Adrenals/Pancreas. 5. AKI Presented with Classical Vol depletion.  Resolved. Bicarbonate drip discontinued 1/29. Be met in 6. Diabetes mellitus type 1, uncontrolled (hemoglobin A1c 14.5): Patient states that she can afford Lantus. Resume Lantus and meal coverage once DKA resolved. She is in the process of trying to find a new endocrinologist.   Code Status: Full code.  Family Communication:  Discussed with patient plan of care Disposition Plan: Home 24-72 hours   Pleas Koch, MD  [facility] Pager [phone] 09/02/2011, 3:11 PM    LOS: 3 days

## 2011-09-03 LAB — GLUCOSE, CAPILLARY
Glucose-Capillary: 194 mg/dL — ABNORMAL HIGH (ref 70–99)
Glucose-Capillary: 70 mg/dL (ref 70–99)
Glucose-Capillary: 122 mg/dL — ABNORMAL HIGH (ref 70–99)

## 2011-09-03 MED ORDER — INSULIN LISPRO 100 UNIT/ML ~~LOC~~ SOLN: SUBCUTANEOUS | Status: DC

## 2011-09-03 NOTE — Progress Notes (Signed)
Patient's CBG  Is 70, no coverage  given at this time. Patient stated that she still needs to have 2 units of Insulin since she had orange juice and will be eating her breakfast.patient  was informed that we  will rechecked and closely  monitor her CBG.Marland Kitchen

## 2011-09-03 NOTE — Discharge Summary (Signed)
Physician Discharge Summary  Patient ID: [name] MRN: [id number] DOB/AGE: 1981-01-20 31 y.o.  Admit date: 08/30/2011 Discharge date: 09/03/2011  Admission Diagnoses: DKA   Brief narrative: 31 year old woman presented to the emergency department with sudden onset of vomiting and abdominal pain 2 days ago. Emergency department impression was vomiting, gastritis, gastroparesis.  Labs were consistent with severe acidosis initially and patient was then started on an insulin Gtt which she required for over 24 hours. Nephrology was consulted to help with her significantly decreased bicarbonate, which ultimately resolved.  She never really appeared very ill clinically, and had wide fluctuations of her blood sugars the first day that she was off of the Insulin Gtt.  Despite this, she stated her preference to being discharged home, and stated that she could control her blood sugar better with Carbohydrate coverage and her Endocrinologist's method of controlling blood sugar at home.  Past medical history: Diabetes mellitus, Addison's disease  Discharge Diagnoses:  Principal Problem:  *Nausea and vomiting Active Problems:  DM I (diabetes mellitus, type I), uncontrolled   Hospital Course 1. DKA: Inciting event unclear. Patient has been out of her insulin for the last few days however she reports that nausea and vomiting began before she ran out of insulin.  Admit to not usual stringent control of Diabetes.  Anion gap initially 20 with a bicarb of 8-->22. Transition to regular home insulin. 2. Patient wishes to go home despite my preference to have her stay one more day.  Discussed this plan in detail with patient and mother who are both of this opinion and have a clear plan if her blood sugars run really high (above 500, as was the case last pm, or 70, where it was this am) 3. Hyperkalemia: Lab error. 4. Presumed acute adrenal crisis/known Addison's disease: Random cortisol.  IV Solu-Cortef  tapered to home dose of regular steroids, and likely ancillary cause for elevated blood sugars  5. Abdominal pain/nausea/vomiting: Supportive care. Antiemetics. Exam is benign. lipase within normal limits. Gastroenteritis favored initial cause 6. AKI Presented with Classical Vol depletion, Bun 28.  Resolved. Bicarbonate drip discontinued 1/29.  7. Diabetes mellitus type 1, uncontrolled (hemoglobin A1c 14.5): Patient states that she can afford Lantus.  She is in the process of trying to find a new endocrinologist.  Have strongly urged her and family to stay one more day to control sugars, but the benfit of dischargiong her with insulin outweighs the risk of letting her leave AMA and she understands the risks of hypoglycemia and hyperglycemia   Treatments: IV hydration and insulin: regular and Lantus  Discharge Exam: Blood pressure 119/79, pulse 79, temperature 98.3 F (36.8 C), temperature source Oral, resp. rate 18, height 5\' 2"  (1.575 m), weight 52.799 kg (116 lb 6.4 oz), last menstrual period 08/11/2011, SpO2 100.00%. General: Appears mildly ill. Calm. Eyes: Pupils equal, round and reactive to light. Normal lids, irises, conjunctiva. ENT: Grossly normal hearing. Normal lips and tongue. Good dentition. Heart and soft palate appear unremarkable.  Has a scalp lesion that is 3 cm round, but dry and non-infected appearing Neck: No lymphadenopathy or masses. No thyromegaly. Cardiovascular: Tachycardic. Regular rhythm. No murmur, rub or gallop. No lower extremity edema. Telemetry: Sinus tachycardia. No arrhythmias.  Tachycardic to the 90's Respiratory: Clear to auscultation bilaterally. No wheezes, rales or rhonchi. Normal respiratory effort. Abdomen: Soft, nondistended. Mild epigastric tenderness. Overall benign on examination. Skin: No rash or lesions seen. No induration seen. Psychiatric: Grossly normal mood and affect. Speech fluent and  appropriate. Appears to be mentating normally. Neurologic:  Nonfocal.   Disposition: Home or Self Care  Discharge Orders    Future Orders Please Complete By Expires   Diet - low sodium heart healthy      Increase activity slowly      Call MD for:  persistant nausea and vomiting      Call MD for:  severe uncontrolled pain      Call MD for:  difficulty breathing, headache or visual disturbances      Call MD for:  extreme fatigue        Medication List  As of 09/03/2011  2:18 PM   TAKE these medications         citalopram 20 MG tablet   Commonly known as: CELEXA   Take 20 mg by mouth daily.      fludrocortisone 0.1 MG tablet   Commonly known as: FLORINEF   Take 0.1 mg by mouth daily.      hydrocortisone 10 MG tablet   Commonly known as: CORTEF   Take 10-15 mg by mouth See admin instructions. 10 mg every morning and 15 every evening      insulin glargine 100 UNIT/ML injection   Commonly known as: LANTUS   Inject 20 Units into the skin at bedtime.      insulin lispro 100 UNIT/ML injection   Commonly known as: HUMALOG   Inject 5 Units into the skin 3 (three) times daily before meals.      insulin lispro 100 UNIT/ML injection   Commonly known as: HUMALOG   Per Carbohydrate counting scale as per endocrinologist      rOPINIRole 0.25 MG tablet   Commonly known as: REQUIP   Take 0.25-0.5 mg by mouth at bedtime.           Follow-up Information    Follow up with [facility], MD in 5 days.      Follow up with [facility], MD. Schedule an appointment as soon as possible for a visit in 2 weeks.   Contact information:   [address] [facility] [place] [address] [phone]          Signed: Pleas Koch 09/03/2011, 2:18 PM

## 2011-09-03 NOTE — Progress Notes (Signed)
np notified of patient fsbs of 541 new orders received and initiated

## 2011-09-03 NOTE — Progress Notes (Signed)
Patient discharged to home, alert and oriented. MD in the room at this time discussed w/ patient and mother about staying for 1 more day but patient is very insistent to go home. PIV removed no s/s of infiltration , no swelling noted. D/c instructions and follow up appointment discussed and given to patient.

## 2012-01-08 ENCOUNTER — Ambulatory Visit (INDEPENDENT_AMBULATORY_CARE_PROVIDER_SITE_OTHER): Payer: BC Managed Care – PPO | Admitting: Family Medicine

## 2012-01-08 VITALS — BP 100/70 | HR 78 | Temp 98.0°F | Resp 16 | Ht 62.0 in | Wt 110.0 lb

## 2012-01-08 DIAGNOSIS — E109 Type 1 diabetes mellitus without complications: Secondary | ICD-10-CM

## 2012-01-08 DIAGNOSIS — L0390 Cellulitis, unspecified: Secondary | ICD-10-CM

## 2012-01-08 DIAGNOSIS — L0291 Cutaneous abscess, unspecified: Secondary | ICD-10-CM

## 2012-01-08 MED ORDER — DOXYCYCLINE HYCLATE 100 MG PO TABS: 100.0000 mg | ORAL_TABLET | Freq: Two times a day (BID) | ORAL | Status: AC

## 2012-01-08 NOTE — Patient Instructions (Signed)
Return if place is getting at all worse.

## 2012-01-08 NOTE — Progress Notes (Signed)
Subjective: In lady with Addison's and diabetes who is here with an infection of her scalp. She's had other ones like this. She has a nervous habit of picking at her scalp.  Objective: Small crusted abscessed appearing area on the right [name] for scalp. Just medial to that is a 1 CM patch of alopecia where she has had a previous abscess.  I used 2 mm correct and scraped the top of the lesion, curetting down, got pus. This was expressed and cultured.  Assessment: Scalp abscess, simple I&D  Plan: Culture Doxycycline 100 twice a day Sun precautions

## 2012-01-12 LAB — WOUND CULTURE

## 2012-02-13 ENCOUNTER — Encounter (HOSPITAL_COMMUNITY): Payer: Self-pay | Admitting: *Deleted

## 2012-02-13 ENCOUNTER — Inpatient Hospital Stay (HOSPITAL_COMMUNITY)
Admission: EM | Admit: 2012-02-13 | Discharge: 2012-02-15 | DRG: 295 | Disposition: A | Discharge status: Home / Self Care | Payer: BC Managed Care – PPO | Attending: Internal Medicine | Admitting: Internal Medicine

## 2012-02-13 DIAGNOSIS — R112 Nausea with vomiting, unspecified: Secondary | ICD-10-CM | POA: Diagnosis present

## 2012-02-13 DIAGNOSIS — E271 Primary adrenocortical insufficiency: Secondary | ICD-10-CM

## 2012-02-13 DIAGNOSIS — E2749 Other adrenocortical insufficiency: Secondary | ICD-10-CM | POA: Diagnosis present

## 2012-02-13 DIAGNOSIS — E1110 Type 2 diabetes mellitus with ketoacidosis without coma: Secondary | ICD-10-CM

## 2012-02-13 DIAGNOSIS — E1010 Type 1 diabetes mellitus with ketoacidosis without coma: Principal | ICD-10-CM | POA: Diagnosis present

## 2012-02-13 DIAGNOSIS — IMO0002 Reserved for concepts with insufficient information to code with codable children: Secondary | ICD-10-CM | POA: Diagnosis present

## 2012-02-13 DIAGNOSIS — E1065 Type 1 diabetes mellitus with hyperglycemia: Secondary | ICD-10-CM | POA: Diagnosis present

## 2012-02-13 LAB — COMPREHENSIVE METABOLIC PANEL
ALT: 19 U/L (ref 0–35)
AST: 28 U/L (ref 0–37)
Albumin: 3.7 g/dL (ref 3.5–5.2)
CO2: 16 mEq/L — ABNORMAL LOW (ref 19–32)
Chloride: 83 mEq/L — ABNORMAL LOW (ref 96–112)
GFR calc non Af Amer: 78 mL/min — ABNORMAL LOW (ref >90)
Potassium: 5.2 mEq/L — ABNORMAL HIGH (ref 3.5–5.1)
Sodium: 125 mEq/L — ABNORMAL LOW (ref 135–145)
Total Bilirubin: 0.6 mg/dL (ref 0.3–1.2)

## 2012-02-13 LAB — BASIC METABOLIC PANEL
BUN: 21 mg/dL (ref 6–23)
Chloride: 96 mEq/L (ref 96–112)
Creatinine, Ser: 0.76 mg/dL (ref 0.50–1.10)
GFR calc Af Amer: >90 mL/min (ref >90)
GFR calc Af Amer: >90 mL/min (ref >90)
GFR calc non Af Amer: >90 mL/min (ref >90)
GFR calc non Af Amer: >90 mL/min (ref >90)
Glucose, Bld: 213 mg/dL — ABNORMAL HIGH (ref 70–99)
Glucose, Bld: 222 mg/dL — ABNORMAL HIGH (ref 70–99)
Potassium: 4.4 mEq/L (ref 3.5–5.1)
Potassium: 5 mEq/L (ref 3.5–5.1)
Potassium: 5.3 mEq/L — ABNORMAL HIGH (ref 3.5–5.1)
Sodium: 126 mEq/L — ABNORMAL LOW (ref 135–145)
Sodium: 126 mEq/L — ABNORMAL LOW (ref 135–145)

## 2012-02-13 LAB — CBC WITH DIFFERENTIAL/PLATELET
Eosinophils Relative: 1 % (ref 0–5)
Lymphocytes Relative: 34 % (ref 12–46)
MCV: 87.8 fL (ref 78.0–100.0)
Monocytes Relative: 11 % (ref 3–12)
Neutrophils Relative %: 54 % (ref 43–77)
Platelets: 329 10*3/uL (ref 150–400)
RBC: 5.43 MIL/uL — ABNORMAL HIGH (ref 3.87–5.11)
WBC: 10.8 10*3/uL — ABNORMAL HIGH (ref 4.0–10.5)

## 2012-02-13 LAB — BLOOD GAS, VENOUS
Acid-base deficit: 6.1 mmol/L — ABNORMAL HIGH (ref 0.0–2.0)
O2 Saturation: 27.6 %
pCO2, Ven: 42.8 mmHg — ABNORMAL LOW (ref 45.0–50.0)
pO2, Ven: 19.4 mmHg — CL (ref 30.0–45.0)

## 2012-02-13 LAB — CBC
HCT: 43.6 % (ref 36.0–46.0)
MCV: 86 fL (ref 78.0–100.0)
RBC: 5.07 MIL/uL (ref 3.87–5.11)
WBC: 8.9 10*3/uL (ref 4.0–10.5)

## 2012-02-13 LAB — GLUCOSE, CAPILLARY
Glucose-Capillary: 282 mg/dL — ABNORMAL HIGH (ref 70–99)
Glucose-Capillary: 105 mg/dL — ABNORMAL HIGH (ref 70–99)
Glucose-Capillary: 122 mg/dL — ABNORMAL HIGH (ref 70–99)

## 2012-02-13 LAB — URINALYSIS, ROUTINE W REFLEX MICROSCOPIC
Bilirubin Urine: NEGATIVE
Glucose, UA: >1000 mg/dL — AB
Hgb urine dipstick: NEGATIVE
Protein, ur: NEGATIVE mg/dL
Urobilinogen, UA: 0.2 mg/dL (ref 0.0–1.0)

## 2012-02-13 LAB — KETONES, QUALITATIVE

## 2012-02-13 LAB — MRSA PCR SCREENING: MRSA by PCR: NEGATIVE

## 2012-02-13 MED ORDER — SODIUM CHLORIDE 0.9 % IJ SOLN
3.0000 mL | Freq: Two times a day (BID) | INTRAMUSCULAR | Status: DC
  Administered 2012-02-13: 3 mL via INTRAVENOUS

## 2012-02-13 MED ORDER — DEXTROSE-NACL 5-0.45 % IV SOLN
INTRAVENOUS | Status: DC
  Administered 2012-02-14: 125 mL/h via INTRAVENOUS

## 2012-02-13 MED ORDER — SODIUM CHLORIDE 0.9 % IV SOLN
1000.0000 mL | INTRAVENOUS | Status: DC
  Administered 2012-02-13: 1000 mL via INTRAVENOUS

## 2012-02-13 MED ORDER — SODIUM CHLORIDE 0.9 % IJ SOLN: 3.0000 mL | INTRAMUSCULAR | Status: DC | PRN

## 2012-02-13 MED ORDER — INSULIN REGULAR HUMAN 100 UNIT/ML IJ SOLN
INTRAMUSCULAR | Status: DC
  Administered 2012-02-13: 4.5 [IU]/h via INTRAVENOUS
  Administered 2012-02-13: 2.2 [IU]/h via INTRAVENOUS
  Administered 2012-02-13: 1.6 [IU]/h via INTRAVENOUS
  Filled 2012-02-13: qty 1

## 2012-02-13 MED ORDER — ONDANSETRON 8 MG PO TBDP
8.0000 mg | ORAL_TABLET | Freq: Once | ORAL | Status: AC
Start: 2012-02-13 — End: 2012-02-13
  Administered 2012-02-13: 8 mg via ORAL
  Filled 2012-02-13: qty 1

## 2012-02-13 MED ORDER — SODIUM CHLORIDE 0.9 % IV SOLN: 250.0000 mL | INTRAVENOUS | Status: DC | PRN

## 2012-02-13 MED ORDER — HYDROCORTISONE SOD SUCCINATE 100 MG IJ SOLR
50.0000 mg | Freq: Two times a day (BID) | INTRAMUSCULAR | Status: DC
  Administered 2012-02-13 – 2012-02-14 (×2): 50 mg via INTRAVENOUS
  Filled 2012-02-13 (×2): qty 1
  Filled 2012-02-13 (×2): qty 2

## 2012-02-13 MED ORDER — ONDANSETRON HCL 4 MG/2ML IJ SOLN: 4.0000 mg | Freq: Four times a day (QID) | INTRAMUSCULAR | Status: DC | PRN

## 2012-02-13 MED ORDER — FLUDROCORTISONE ACETATE 0.1 MG PO TABS
0.1000 mg | ORAL_TABLET | Freq: Every day | ORAL | Status: DC
  Administered 2012-02-13 – 2012-02-15 (×3): 0.1 mg via ORAL
  Filled 2012-02-13 (×3): qty 1

## 2012-02-13 MED ORDER — SODIUM CHLORIDE 0.9 % IV BOLUS (SEPSIS): 1000.0000 mL | Freq: Once | INTRAVENOUS | Status: DC

## 2012-02-13 MED ORDER — KCL IN DEXTROSE-NACL 20-5-0.9 MEQ/L-%-% IV SOLN
INTRAVENOUS | Status: DC
  Administered 2012-02-13: 16:00:00 via INTRAVENOUS
  Filled 2012-02-13 (×3): qty 1000

## 2012-02-13 MED ORDER — ROPINIROLE HCL 0.25 MG PO TABS
0.2500 mg | ORAL_TABLET | Freq: Every day | ORAL | Status: DC
  Administered 2012-02-13 – 2012-02-14 (×2): 0.25 mg via ORAL
  Filled 2012-02-13 (×3): qty 1

## 2012-02-13 MED ORDER — DEXTROSE 50 % IV SOLN
25.0000 mL | INTRAVENOUS | Status: DC | PRN
  Filled 2012-02-13: qty 50

## 2012-02-13 MED ORDER — SODIUM CHLORIDE 0.9 % IV SOLN
1000.0000 mL | Freq: Once | INTRAVENOUS | Status: AC
  Administered 2012-02-13: 1000 mL via INTRAVENOUS

## 2012-02-13 NOTE — ED Provider Notes (Signed)
History     CSN: [id number]  Arrival date & time 02/13/12  4098   First MD Initiated Contact with Patient 02/13/12 1018      Chief Complaint  Patient presents with  . Nausea  . Emesis  . Diabetes    (Consider location/radiation/quality/duration/timing/severity/associated sxs/prior treatment) HPI Pt with hx of diabetes and addison's disease.  Presents with nausea/vomiting.  Also elevated blood sugar.  She states she took her insulin last night- lantus, and also supplemented with humalog.  However, she states she has not been checking blood sugar as she has been feeling badly.  Denies abdominal pain, no diarrhea.  No fever/chills.  She has not had cough, sore throat or other localizing symptoms.  She does have a hx of DKA in the past.  There are no other associated systemic symptoms, there are no other alleviating or modifying factors.   Past Medical History  Diagnosis Date  . Diabetes mellitus   . Addison disease     Past Surgical History  Procedure Date  . Wisdom tooth extraction     History reviewed. No pertinent family history.  History  Substance Use Topics  . Smoking status: Never Smoker   . Smokeless tobacco: Never Used  . Alcohol Use: Yes     occasionally    OB History    Grav Para Term Preterm Abortions TAB SAB Ect Mult Living                  Review of Systems ROS reviewed and all otherwise negative except for mentioned in HPI  Allergies  Review of patient's allergies indicates no known allergies.  Home Medications   No current outpatient prescriptions on file.  BP 94/48  Pulse 99  Temp 98.4 F (36.9 C) (Oral)  Resp 18  Ht 5\' 2"  (1.575 m)  Wt 104 lb 0.9 oz (47.2 kg)  BMI 19.03 kg/m2  SpO2 99%  LMP 01/29/2012 Vitals reviewed Physical Exam Physical Examination: General appearance - alert, ill appearing, and in no distress Mental status - alert, oriented to person, place, and time Eyes - pupils equal and reactive, no conjunctival injection  or scleral icterus Mouth - mucous membranes dry Neck - supple, no significant adenopathy Chest - clear to auscultation, no wheezes, rales or rhonchi, symmetric air entry Heart - normal rate, regular rhythm, normal S1, S2, no murmurs, rubs, clicks or gallops Abdomen - soft, nontender, nondistended, no masses or organomegaly, nabs Neurological - alert, oriented, normal speech, cranial nerves grossly intact, strength 5/5 in extremities x 4, sensation intact Extremities - peripheral pulses normal, no pedal edema, no clubbing or cyanosis Skin - normal coloration, poor skin turgor, delayed cap refill, no rash  ED Course  Procedures (including critical care time)   Date: 02/13/2012  Rate: 116  Rhythm: sinus tachycardia  QRS Axis: normal  Intervals: normal  ST/T Wave abnormalities: nonspecific T wave changes  Conduction Disutrbances:none  Narrative Interpretation:   Old EKG Reviewed: no significant changes compared to prior ekg of 01/04/11   CRITICAL CARE Performed by: Ethelda Chick   Total critical care time: 45  Critical care time was exclusive of separately billable procedures and treating other patients.  Critical care was necessary to treat or prevent imminent or life-threatening deterioration.  Critical care was time spent personally by me on the following activities: development of treatment plan with patient and/or surrogate as well as nursing, discussions with consultants, evaluation of patient's response to treatment, examination of patient, obtaining history from patient  or surrogate, ordering and performing treatments and interventions, ordering and review of laboratory studies, ordering and review of radiographic studies, pulse oximetry and re-evaluation of patient's condition.    2:30 PM d/w [facility] for admission- they will see in the ED.   Labs Reviewed  CBC WITH DIFFERENTIAL - Abnormal; Notable for the following:    WBC 10.8 (*)     RBC 5.43 (*)     Hemoglobin 17.6  (*)     HCT 47.7 (*)     MCHC 36.9 (*)     Monocytes Absolute 1.2 (*)     All other components within normal limits  COMPREHENSIVE METABOLIC PANEL - Abnormal; Notable for the following:    Sodium 125 (*)  REPEATED TO VERIFY   Potassium 5.2 (*)     Chloride 83 (*)  REPEATED TO VERIFY   CO2 16 (*)  REPEATED TO VERIFY   Glucose, Bld 505 (*)     BUN 24 (*)     Calcium 10.6 (*)     Alkaline Phosphatase 127 (*)     GFR calc non Af Amer 78 (*)     All other components within normal limits  URINALYSIS, ROUTINE W REFLEX MICROSCOPIC - Abnormal; Notable for the following:    Specific Gravity, Urine 1.034 (*)     Glucose, UA >1000 (*)     Ketones, ur >80 (*)     All other components within normal limits  GLUCOSE, CAPILLARY - Abnormal; Notable for the following:    Glucose-Capillary 506 (*)     All other components within normal limits  GLUCOSE, CAPILLARY - Abnormal; Notable for the following:    Glucose-Capillary 282 (*)     All other components within normal limits  KETONES, QUALITATIVE - Abnormal; Notable for the following:    Acetone, Bld MODERATE (*)     All other components within normal limits  BLOOD GAS, VENOUS - Abnormal; Notable for the following:    pCO2, Ven 42.8 (*)     pO2, Ven 19.4 (*)     Acid-base deficit 6.1 (*)     All other components within normal limits  GLUCOSE, CAPILLARY - Abnormal; Notable for the following:    Glucose-Capillary 182 (*)     All other components within normal limits  GLUCOSE, CAPILLARY - Abnormal; Notable for the following:    Glucose-Capillary 145 (*)     All other components within normal limits  BASIC METABOLIC PANEL - Abnormal; Notable for the following:    Sodium 129 (*)     Chloride 94 (*)     Glucose, Bld 213 (*)     All other components within normal limits  URINE MICROSCOPIC-ADD ON - Abnormal; Notable for the following:    Squamous Epithelial / LPF FEW (*)     Bacteria, UA FEW (*)     All other components within normal limits    GLUCOSE, CAPILLARY - Abnormal; Notable for the following:    Glucose-Capillary 105 (*)     All other components within normal limits  GLUCOSE, CAPILLARY - Abnormal; Notable for the following:    Glucose-Capillary 122 (*)     All other components within normal limits  BASIC METABOLIC PANEL - Abnormal; Notable for the following:    Sodium 126 (*)     Chloride 93 (*)     CO2 15 (*)     Glucose, Bld 222 (*)     All other components within normal limits  BASIC METABOLIC PANEL -  Abnormal; Notable for the following:    Sodium 126 (*)     Potassium 5.3 (*)     CO2 14 (*)     Glucose, Bld 219 (*)     All other components within normal limits  BASIC METABOLIC PANEL - Abnormal; Notable for the following:    Sodium 127 (*)     Glucose, Bld 156 (*)     All other components within normal limits  CBC - Abnormal; Notable for the following:    Hemoglobin 16.4 (*)     All other components within normal limits  GLUCOSE, CAPILLARY - Abnormal; Notable for the following:    Glucose-Capillary 155 (*)     All other components within normal limits  BASIC METABOLIC PANEL - Abnormal; Notable for the following:    Sodium 127 (*)     Glucose, Bld 123 (*)     Creatinine, Ser 0.47 (*)     All other components within normal limits  GLUCOSE, CAPILLARY - Abnormal; Notable for the following:    Glucose-Capillary 216 (*)     All other components within normal limits  GLUCOSE, CAPILLARY - Abnormal; Notable for the following:    Glucose-Capillary 229 (*)     All other components within normal limits  GLUCOSE, CAPILLARY - Abnormal; Notable for the following:    Glucose-Capillary 205 (*)     All other components within normal limits  GLUCOSE, CAPILLARY - Abnormal; Notable for the following:    Glucose-Capillary 221 (*)     All other components within normal limits  GLUCOSE, CAPILLARY - Abnormal; Notable for the following:    Glucose-Capillary 182 (*)     All other components within normal limits  GLUCOSE,  CAPILLARY - Abnormal; Notable for the following:    Glucose-Capillary 176 (*)     All other components within normal limits  GLUCOSE, CAPILLARY - Abnormal; Notable for the following:    Glucose-Capillary 143 (*)     All other components within normal limits  GLUCOSE, CAPILLARY - Abnormal; Notable for the following:    Glucose-Capillary 148 (*)     All other components within normal limits  GLUCOSE, CAPILLARY - Abnormal; Notable for the following:    Glucose-Capillary 105 (*)     All other components within normal limits  GLUCOSE, CAPILLARY - Abnormal; Notable for the following:    Glucose-Capillary 111 (*)     All other components within normal limits  GLUCOSE, CAPILLARY - Abnormal; Notable for the following:    Glucose-Capillary 122 (*)     All other components within normal limits  GLUCOSE, CAPILLARY - Abnormal; Notable for the following:    Glucose-Capillary 214 (*)     All other components within normal limits  POCT PREGNANCY, URINE  GLUCOSE, CAPILLARY  MRSA PCR SCREENING   No results found.   1. Diabetic ketoacidosis   2. DM I (diabetes mellitus, type I), uncontrolled   3. Nausea & vomiting   4. Addison disease       MDM  Pt with DM with nausea and vomiting, ill appearing.  Found to be in DKA.  Pt placed on monitor, IV access obtained.  IV hydration initiated as well as insulin drip.  AG 26.  Pt maintaining her airway.  Pt admitted to [facility] for further management        Ethelda Chick, MD 02/14/12 1101

## 2012-02-13 NOTE — ED Notes (Signed)
Pt states she started having n/v yesterday. Pt states she is a diabetic. Pt denies any blurred vision or frequent urination. Pt states she had not been able keep any po's down for 24hrs.

## 2012-02-13 NOTE — H&P (Signed)
[facility] History and Physical  [name]:[id number] DOB: Jul 04, 1981 DOA: 02/13/2012  Referring physician: ER PCP: Gweneth Dimitri, MD   Chief Complaint: N/V  HPI:  31 yo F who comes in c/o N/V for a day.  Missed some doses of her lantus.  She denies dysuria, feer, chills, or cough,  No sick contacts.   Mother at bedside provides most of the history.  Patient was seen here in January for a similar episode.   No blurry vision, no chest pain, no frequent urination Would like some ice chips  Review of Systems:  All systems reviews, negative unless stated above   Past Medical History  Diagnosis Date  . Diabetes mellitus   . Addison disease    Past Surgical History  Procedure Date  . Wisdom tooth extraction    Social History:  reports that she has never smoked. She has never used smokeless tobacco. She reports that she drinks alcohol. She reports that she does not use illicit drugs.  No Known Allergies  History reviewed. +DM  Prior to Admission medications   Medication Sig Start Date End Date Taking? Authorizing Provider  citalopram (CELEXA) 20 MG tablet Take 20 mg by mouth daily.   Yes Historical Provider, MD  fludrocortisone (FLORINEF) 0.1 MG tablet Take 0.1 mg by mouth daily.   Yes Historical Provider, MD  hydrocortisone (CORTEF) 10 MG tablet Take 10 mg by mouth 2 (two) times daily. 10 mg every morning and 15 every evening   Yes Historical Provider, MD  insulin glargine (LANTUS) 100 UNIT/ML injection Inject 18 Units into the skin at bedtime.    Yes Historical Provider, MD  insulin lispro (HUMALOG) 100 UNIT/ML injection Inject 0-5 Units into the skin 3 (three) times daily before meals.    Yes Historical Provider, MD  rOPINIRole (REQUIP) 0.25 MG tablet Take 0.25 mg by mouth at bedtime.    Yes Historical Provider, MD   Physical Exam: Filed Vitals:   02/13/12 1004 02/13/12 1219 02/13/12 1444  BP: 92/63 109/57 98/79  Pulse: 115 107   Temp: 98.1 F (36.7 C) 98.9  F (37.2 C) 98.2 F (36.8 C)  TempSrc: Oral Oral Oral  Resp: 14 16 20   SpO2: 98% 99% 98%     General:  uncomfortable appearing, poor eye contact  Eyes: WNL  ENT: dry membranes  Neck: supple  Cardiovascular: tachy  Respiratory: clear B/L, no wheezing  Abdomen: +BS, soft, NT  Skin: no rashes or lesions  Musculoskeletal: moves all 4 extremities  Psychiatric: poor eye contact, poor historian  Neurologic: no focal deficits  Labs on Admission:  Basic Metabolic Panel:  Lab 02/13/12 8119 02/13/12 1002  NA 129* 125*  K 4.4 5.2*  CL 94* 83*  CO2 19 16*  GLUCOSE 213* 505*  BUN 21 24*  CREATININE 0.76 0.95  CALCIUM 9.3 10.6*  MG -- --  PHOS -- --   Liver Function Tests:  Lab 02/13/12 1002  AST 28  ALT 19  ALKPHOS 127*  BILITOT 0.6  PROT 7.7  ALBUMIN 3.7   No results found for this basename: LIPASE:5,AMYLASE:5 in the last 168 hours No results found for this basename: AMMONIA:5 in the last 168 hours CBC:  Lab 02/13/12 1002  WBC 10.8*  NEUTROABS 5.8  HGB 17.6*  HCT 47.7*  MCV 87.8  PLT 329   Cardiac Enzymes: No results found for this basename: CKTOTAL:5,CKMB:5,CKMBINDEX:5,TROPONINI:5 in the last 168 hours BNP: No components found with this basename: POCBNP:5 CBG:  Lab 02/13/12  1506 02/13/12 1402 02/13/12 1252 02/13/12 1146 02/13/12 1013  GLUCAP 105* 145* 182* 282* 506*    Radiological Exams on Admission: No results found.  EKG: Independently reviewed. Sinus tach  Assessment/Plan Principal Problem:  *DKA (diabetic ketoacidoses) Active Problems:  DM I (diabetes mellitus, type I), uncontrolled  Nausea & vomiting   1. DKA- place on protocol, insulin gtt, BMP q 4- patient has improved in the ER already.  I think her gap should be closed at the 7PM BMP- plan to start 18 units lantus (what she takes at home) when gap is closed.  Give ice chips and advance diet.  Will place in step down for now- do not think she will be in DKA for long and if beds  are needed, may transfer out of step down, ? Non compliance , no report of sickness 2. Addison's disease- give IV solu-cortef and wean off as tolerates to home dose 3. Hyponatremia- improving with IVF and decrease in BS  Code Status: full Family Communication: mother at bedside Disposition Plan: home when better  Marlin Canary [facility] Pager [phone]  If 8PM-8AM, please contact night-coverage [email or web address] Password [facility] 02/13/2012, 3:16 PM

## 2012-02-13 NOTE — ED Notes (Signed)
Pt. Is not able to use the restroom at this time, but is aware that we need urine.

## 2012-02-13 NOTE — ED Notes (Signed)
Hospitalist at bedside 

## 2012-02-13 NOTE — ED Notes (Signed)
Pt did not have any emesis in triage. No emesis observed but pt is laying on the in triage will not get up. Pt states she is sick but is also pacing the floor.

## 2012-02-13 NOTE — ED Notes (Signed)
Pt. Is still unable to use the restroom at this time.  

## 2012-02-14 DIAGNOSIS — E2749 Other adrenocortical insufficiency: Secondary | ICD-10-CM

## 2012-02-14 DIAGNOSIS — E271 Primary adrenocortical insufficiency: Secondary | ICD-10-CM | POA: Diagnosis present

## 2012-02-14 LAB — GLUCOSE, CAPILLARY
Glucose-Capillary: 229 mg/dL — ABNORMAL HIGH (ref 70–99)
Glucose-Capillary: 205 mg/dL — ABNORMAL HIGH (ref 70–99)
Glucose-Capillary: 182 mg/dL — ABNORMAL HIGH (ref 70–99)
Glucose-Capillary: 143 mg/dL — ABNORMAL HIGH (ref 70–99)
Glucose-Capillary: 148 mg/dL — ABNORMAL HIGH (ref 70–99)
Glucose-Capillary: 105 mg/dL — ABNORMAL HIGH (ref 70–99)
Glucose-Capillary: 111 mg/dL — ABNORMAL HIGH (ref 70–99)
Glucose-Capillary: 256 mg/dL — ABNORMAL HIGH (ref 70–99)
Glucose-Capillary: 355 mg/dL — ABNORMAL HIGH (ref 70–99)
Glucose-Capillary: 338 mg/dL — ABNORMAL HIGH (ref 70–99)

## 2012-02-14 LAB — BASIC METABOLIC PANEL
BUN: 12 mg/dL (ref 6–23)
CO2: 20 mEq/L (ref 19–32)
Chloride: 96 mEq/L (ref 96–112)
Chloride: 98 mEq/L (ref 96–112)
GFR calc Af Amer: >90 mL/min (ref >90)
GFR calc Af Amer: >90 mL/min (ref >90)
Glucose, Bld: 123 mg/dL — ABNORMAL HIGH (ref 70–99)
Potassium: 4.3 mEq/L (ref 3.5–5.1)
Potassium: 4.1 mEq/L (ref 3.5–5.1)
Sodium: 127 mEq/L — ABNORMAL LOW (ref 135–145)
Sodium: 127 mEq/L — ABNORMAL LOW (ref 135–145)

## 2012-02-14 MED ORDER — SODIUM CHLORIDE 0.9 % IV SOLN
INTRAVENOUS | Status: DC
  Administered 2012-02-14: 09:00:00 via INTRAVENOUS

## 2012-02-14 MED ORDER — INSULIN ASPART 100 UNIT/ML ~~LOC~~ SOLN
7.0000 [IU] | Freq: Once | SUBCUTANEOUS | Status: AC
  Administered 2012-02-14: 7 [IU] via SUBCUTANEOUS

## 2012-02-14 MED ORDER — INSULIN GLARGINE 100 UNIT/ML ~~LOC~~ SOLN
18.0000 [IU] | Freq: Every day | SUBCUTANEOUS | Status: DC
  Administered 2012-02-14 – 2012-02-15 (×2): 18 [IU] via SUBCUTANEOUS

## 2012-02-14 MED ORDER — HYDROCORTISONE 10 MG PO TABS
10.0000 mg | ORAL_TABLET | Freq: Every day | ORAL | Status: DC
  Administered 2012-02-14 – 2012-02-15 (×2): 10 mg via ORAL
  Filled 2012-02-14 (×2): qty 1

## 2012-02-14 MED ORDER — HYDROCORTISONE 5 MG PO TABS
15.0000 mg | ORAL_TABLET | Freq: Every day | ORAL | Status: DC
  Administered 2012-02-14: 15 mg via ORAL
  Filled 2012-02-14 (×2): qty 1

## 2012-02-14 MED ORDER — INSULIN ASPART 100 UNIT/ML ~~LOC~~ SOLN
0.0000 [IU] | Freq: Three times a day (TID) | SUBCUTANEOUS | Status: DC
  Administered 2012-02-14: 7 [IU] via SUBCUTANEOUS
  Administered 2012-02-14: 5 [IU] via SUBCUTANEOUS
  Administered 2012-02-15 (×2): 2 [IU] via SUBCUTANEOUS

## 2012-02-14 MED ORDER — INSULIN ASPART 100 UNIT/ML ~~LOC~~ SOLN
0.0000 [IU] | Freq: Every day | SUBCUTANEOUS | Status: DC
  Administered 2012-02-14: 5 [IU] via SUBCUTANEOUS

## 2012-02-14 MED ORDER — INSULIN ASPART 100 UNIT/ML ~~LOC~~ SOLN
3.0000 [IU] | Freq: Three times a day (TID) | SUBCUTANEOUS | Status: DC
  Administered 2012-02-14: 3 [IU] via SUBCUTANEOUS

## 2012-02-14 MED ORDER — INSULIN ASPART 100 UNIT/ML ~~LOC~~ SOLN
5.0000 [IU] | Freq: Three times a day (TID) | SUBCUTANEOUS | Status: DC
  Administered 2012-02-14 – 2012-02-15 (×3): 5 [IU] via SUBCUTANEOUS

## 2012-02-14 NOTE — Progress Notes (Signed)
pts cbg is 379- covered pt w/ 5u Novolog as ordered.  Call placed to [facility], Dr. Daphane Shepherd.  No new orders received

## 2012-02-14 NOTE — Progress Notes (Signed)
[facility] PROGRESS NOTE  [name] ZOX:[id number] DOB: [birth year] DOA: 02/13/2012 PCP: Gweneth Dimitri, MD  Assessment/Plan: Principal Problem:  *DKA (diabetic ketoacidoses) Active Problems:  DM I (diabetes mellitus, type I), uncontrolled  Nausea & vomiting  1. DKA- gap closed, continue NS, for now check BS q 2 hours x 3 times then back to QID, transitioned off gtt and to SQ and SSI, hope for D/C later today or in the AM 2. N/V- resolved 3. Addison disease- back to home dosages of florinef/cortef  Code Status: full Family Communication: mom at bedside Disposition Plan: home   HPI/Subjective: Feeling better, tolerating clears   Objective: Filed Vitals:   02/14/12 0200 02/14/12 0300 02/14/12 0800 02/14/12 0836  BP: 88/52 98/62  93/53  Pulse: 89 89 92 101  Temp:      TempSrc:      Resp: 13 17 15 19   Height:      Weight:      SpO2: 98% 97% 100% 99%    Intake/Output Summary (Last 24 hours) at 02/14/12 0927 Last data filed at 02/14/12 0640  Gross per 24 hour  Intake 781.25 ml  Output      0 ml  Net 781.25 ml    Exam:   General:  A+Ox3, NAD  Cardiovascular: rrr  Respiratory: clear anterior  Abdomen: +BS, soft, NT  Data Reviewed: Basic Metabolic Panel:  Lab 02/14/12 8119 02/14/12 0300 02/13/12 2302 02/13/12 2014 02/13/12 1240  NA 127* 127* 126* 126* 129*  K 4.1 4.3 5.3* 5.0 4.4  CL 98 96 96 93* 94*  CO2 19 20 14* 15* 19  GLUCOSE 123* 156* 219* 222* 213*  BUN 12 13 16 17 21   CREATININE 0.47* 0.54 0.58 0.58 0.76  CALCIUM 8.6 9.1 8.6 8.9 9.3  MG -- -- -- -- --  PHOS -- -- -- -- --   Liver Function Tests:  Lab 02/13/12 1002  AST 28  ALT 19  ALKPHOS 127*  BILITOT 0.6  PROT 7.7  ALBUMIN 3.7   No results found for this basename: LIPASE:5,AMYLASE:5 in the last 168 hours No results found for this basename: AMMONIA:5 in the last 168 hours CBC:  Lab 02/13/12 2014 02/13/12 1002  WBC 8.9 10.8*  NEUTROABS -- 5.8  HGB 16.4* 17.6*  HCT 43.6  47.7*  MCV 86.0 87.8  PLT 254 329   Cardiac Enzymes: No results found for this basename: CKTOTAL:5,CKMB:5,CKMBINDEX:5,TROPONINI:5 in the last 168 hours BNP (last 3 results) No results found for this basename: PROBNP:3 in the last 8760 hours CBG:  Lab 02/14/12 0843 02/14/12 0746 02/14/12 0642 02/14/12 0539 02/14/12 0431  GLUCAP 214* 122* 111* 105* 148*    Recent Results (from the past 240 hour(s))  MRSA PCR SCREENING     Status: Normal   Collection Time   02/13/12  6:25 PM      Component Value Range Status Comment   MRSA by PCR NEGATIVE  NEGATIVE Final      Studies: No results found.  Scheduled Meds:   . sodium chloride  1,000 mL Intravenous Once  . fludrocortisone  0.1 mg Oral Daily  . hydrocortisone sod succinate (SOLU-CORTEF) injection  50 mg Intravenous Q12H  . insulin aspart  3 Units Subcutaneous TID WC  . insulin glargine  18 Units Subcutaneous Daily  . ondansetron  8 mg Oral Once  . rOPINIRole  0.25 mg Oral QHS  . DISCONTD: sodium chloride  1,000 mL Intravenous Once  . DISCONTD: sodium chloride  3 mL  Intravenous Q12H   Continuous Infusions:   . sodium chloride 125 mL/hr at 02/14/12 0848  . insulin (NOVOLIN-R) infusion 1.5 Units/hr (02/14/12 0640)  . DISCONTD: sodium chloride 1,000 mL (02/13/12 1131)  . DISCONTD: dextrose 5 % and 0.9 % NaCl with KCl 20 mEq/L 125 mL/hr at 02/13/12 1621  . DISCONTD: dextrose 5 % and 0.45% NaCl 125 mL/hr at 02/14/12 0800     Marlin Canary [facility] Pager [phone] If 8PM-8AM, please contact night-coverage [email or web address] Password [email or web address] 02/14/2012, 9:27 AM   LOS: 1 day

## 2012-02-14 NOTE — Care Management Note (Signed)
    Page 1 of 1   02/17/2012     10:13:42 AM   CARE MANAGEMENT NOTE 02/17/2012  Patient:  [name], [name]   Account Number:  [id number]  Date Initiated:  02/14/2012  Documentation initiated by:  Lanier Clam  Subjective/Objective Assessment:   ADMITTED W/DKA     Action/Plan:   FROM HOME ALONE.   Anticipated DC Date:  02/17/2012   Anticipated DC Plan:  HOME/SELF CARE      DC Planning Services  CM consult      Choice offered to / List presented to:             Status of service:  Completed, signed off Medicare Important Message given?   (If response is "NO", the following Medicare IM given date fields will be blank) Date Medicare IM given:   Date Additional Medicare IM given:    Discharge Disposition:  HOME/SELF CARE  Per UR Regulation:  Reviewed for med. necessity/level of care/duration of stay  If discussed at Long Length of Stay Meetings, dates discussed:    Comments:  02/14/12 KATHY MAHABIR RN,BSN NCM [phone] TRANSFERRED FROM ICU TO MED FLOOR

## 2012-02-15 LAB — CBC
Hemoglobin: 12.3 g/dL (ref 12.0–15.0)
MCH: 31.2 pg (ref 26.0–34.0)
MCV: 89.3 fL (ref 78.0–100.0)
RBC: 3.94 MIL/uL (ref 3.87–5.11)

## 2012-02-15 LAB — BASIC METABOLIC PANEL
BUN: 11 mg/dL (ref 6–23)
CO2: 18 mEq/L — ABNORMAL LOW (ref 19–32)
Calcium: 8.3 mg/dL — ABNORMAL LOW (ref 8.4–10.5)
Creatinine, Ser: 0.48 mg/dL — ABNORMAL LOW (ref 0.50–1.10)
Glucose, Bld: 192 mg/dL — ABNORMAL HIGH (ref 70–99)

## 2012-02-15 LAB — GLUCOSE, CAPILLARY
Glucose-Capillary: 142 mg/dL — ABNORMAL HIGH (ref 70–99)
Glucose-Capillary: 185 mg/dL — ABNORMAL HIGH (ref 70–99)
Glucose-Capillary: 182 mg/dL — ABNORMAL HIGH (ref 70–99)

## 2012-02-15 MED ORDER — INSULIN GLARGINE 100 UNIT/ML ~~LOC~~ SOLN: 20.0000 [IU] | Freq: Every day | SUBCUTANEOUS | Status: DC

## 2012-02-15 MED ORDER — INSULIN ASPART 100 UNIT/ML ~~LOC~~ SOLN: 5.0000 [IU] | Freq: Three times a day (TID) | SUBCUTANEOUS | Status: DC

## 2012-02-15 NOTE — Progress Notes (Signed)
Patient discharged via wheelchair. States understanding of discharge instructions and states MD went over insulin instructions with her. Patient states she has been a diabetic doing insulin shots since she was 31 years old. States she has prescribed insulins at home, and will monitor blood sugars, also states knowledge of s/s of hypoglycemia

## 2012-02-15 NOTE — Discharge Summary (Signed)
Physician Discharge Summary  [name]:[id number] DOB: [birth year] DOA: 02/13/2012  PCP: Gweneth Dimitri, MD  Admit date: 02/13/2012 Discharge date: 02/15/2012  Recommendations for Outpatient Follow-up:  Record BS and bring to PCP  Discharge Diagnoses:  Principal Problem:  *DKA (diabetic ketoacidoses) Active Problems:  DM I (diabetes mellitus, type I), uncontrolled  Nausea & vomiting  Addison disease   Discharge Condition: improved  Diet recommendation: diabetic  History of present illness:  31 yo F who comes in c/o N/V for a day. Missed some doses of her lantus. She denies dysuria, feer, chills, or cough, No sick contacts.  Mother at bedside provides most of the history. Patient was seen here in January for a similar episode.  No blurry vision, no chest pain, no frequent urination  Would like some ice chips      Hospital Course:  DKA resolved with DKA protocol.  Patient improved throughout hospitalization. Needs closer BS following and to take medications consistently. Adjustments made to insulin regimine.  Addison's disease was treated with home medications   Discharge Exam: Filed Vitals:   02/15/12 0950  BP: 107/62  Pulse: 85  Temp: 98.1 F (36.7 C)  Resp: 18   Filed Vitals:   02/14/12 2155 02/15/12 0203 02/15/12 0615 02/15/12 0950  BP: 97/64 94/61 98/65  107/62  Pulse: 86 86 86 85  Temp: 97.8 F (36.6 C) 97.4 F (36.3 C) 97.8 F (36.6 C) 98.1 F (36.7 C)  TempSrc: Oral Oral Oral Oral  Resp: 16 16 16 18   Height:      Weight:      SpO2: 99% 95% 98% 100%   General: A+Ox3, NAD Cardiovascular: rrr Respiratory: clear  Discharge Instructions  Discharge Orders    Future Orders Please Complete By Expires   Diet Carb Modified      Increase activity slowly      Discharge instructions      Comments:   Check blood sugars, record and bring to PCP/endocrinologist     Medication List  As of 02/15/2012 10:08 AM   STOP taking these medications        insulin lispro 100 UNIT/ML injection         TAKE these medications         citalopram 20 MG tablet   Commonly known as: CELEXA   Take 20 mg by mouth daily.      fludrocortisone 0.1 MG tablet   Commonly known as: FLORINEF   Take 0.1 mg by mouth daily.      hydrocortisone 10 MG tablet   Commonly known as: CORTEF   Take 10 mg by mouth 2 (two) times daily. 10 mg every morning and 15 every evening      insulin aspart 100 UNIT/ML injection   Commonly known as: novoLOG   Inject 5 Units into the skin 3 (three) times daily with meals.      insulin glargine 100 UNIT/ML injection   Commonly known as: LANTUS   Inject 20 Units into the skin daily.      rOPINIRole 0.25 MG tablet   Commonly known as: REQUIP   Take 0.25 mg by mouth at bedtime.           Follow-up Information    Follow up with [facility], [place] in 1 week.   Contact information:   [address] [place] [address] [phone]       Please follow up. (endocrinologist 2 weeks)  The results of significant diagnostics from this hospitalization (including imaging, microbiology, ancillary and laboratory) are listed below for reference.    Significant Diagnostic Studies: No results found.  Microbiology: Recent Results (from the past 240 hour(s))  MRSA PCR SCREENING     Status: Normal   Collection Time   02/13/12  6:25 PM      Component Value Range Status Comment   MRSA by PCR NEGATIVE  NEGATIVE Final      Labs: Basic Metabolic Panel:  Lab 02/15/12 1610 02/14/12 0700 02/14/12 0300 02/13/12 2302 02/13/12 2014  NA 131* 127* 127* 126* 126*  K 3.9 4.1 4.3 5.3* 5.0  CL 102 98 96 96 93*  CO2 18* 19 20 14* 15*  GLUCOSE 192* 123* 156* 219* 222*  BUN 11 12 13 16 17   CREATININE 0.48* 0.47* 0.54 0.58 0.58  CALCIUM 8.3* 8.6 9.1 8.6 8.9  MG -- -- -- -- --  PHOS -- -- -- -- --   Liver Function Tests:  Lab 02/13/12 1002  AST 28  ALT 19  ALKPHOS 127*  BILITOT 0.6  PROT 7.7    ALBUMIN 3.7   No results found for this basename: LIPASE:5,AMYLASE:5 in the last 168 hours No results found for this basename: AMMONIA:5 in the last 168 hours CBC:  Lab 02/15/12 0450 02/13/12 2014 02/13/12 1002  WBC 7.8 8.9 10.8*  NEUTROABS -- -- 5.8  HGB 12.3 16.4* 17.6*  HCT 35.2* 43.6 47.7*  MCV 89.3 86.0 87.8  PLT 255 254 329   Cardiac Enzymes: No results found for this basename: CKTOTAL:5,CKMB:5,CKMBINDEX:5,TROPONINI:5 in the last 168 hours BNP: BNP (last 3 results) No results found for this basename: PROBNP:3 in the last 8760 hours CBG:  Lab 02/15/12 0713 02/15/12 0405 02/15/12 0114 02/14/12 2208 02/14/12 1755  GLUCAP 163* 185* 142* 379* 223*    Time coordinating discharge: 45  Signed:  Veralyn Lopp  [facility] 02/15/2012, 10:08 AM

## 2012-02-15 NOTE — Progress Notes (Signed)
Spoke to Dr. Benjamine Mola on floor states to remove IV patient feeling a little edematous, and to discharge after lunch today and patient has had 1200 insulin.

## 2012-05-03 IMAGING — CR DG THORACIC SPINE 2V
3 series · 3 of 3 positions shown · non-contrast
Comparison: 01/27/2010

CLINICAL DATA: Hypoglycemia.  Status post fall.

THORACIC SPINE - 2 VIEW

[t t-spine a.p.]
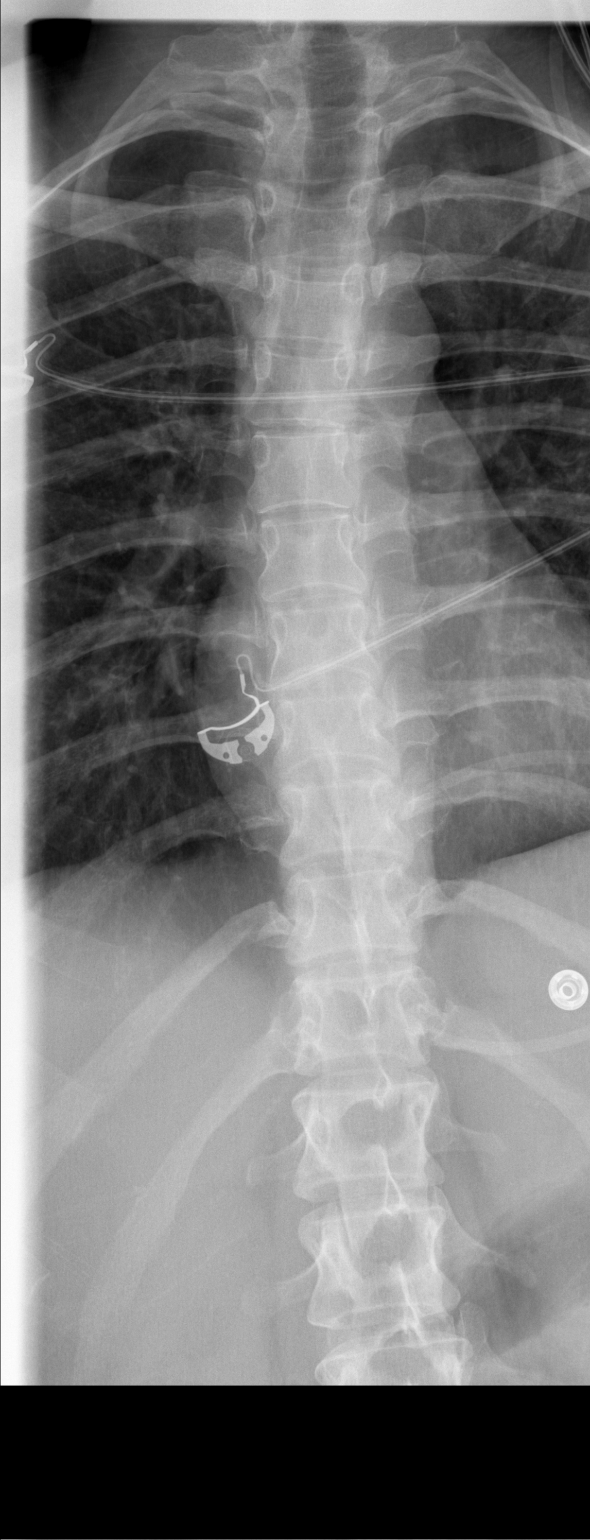

[t t-spine lat]
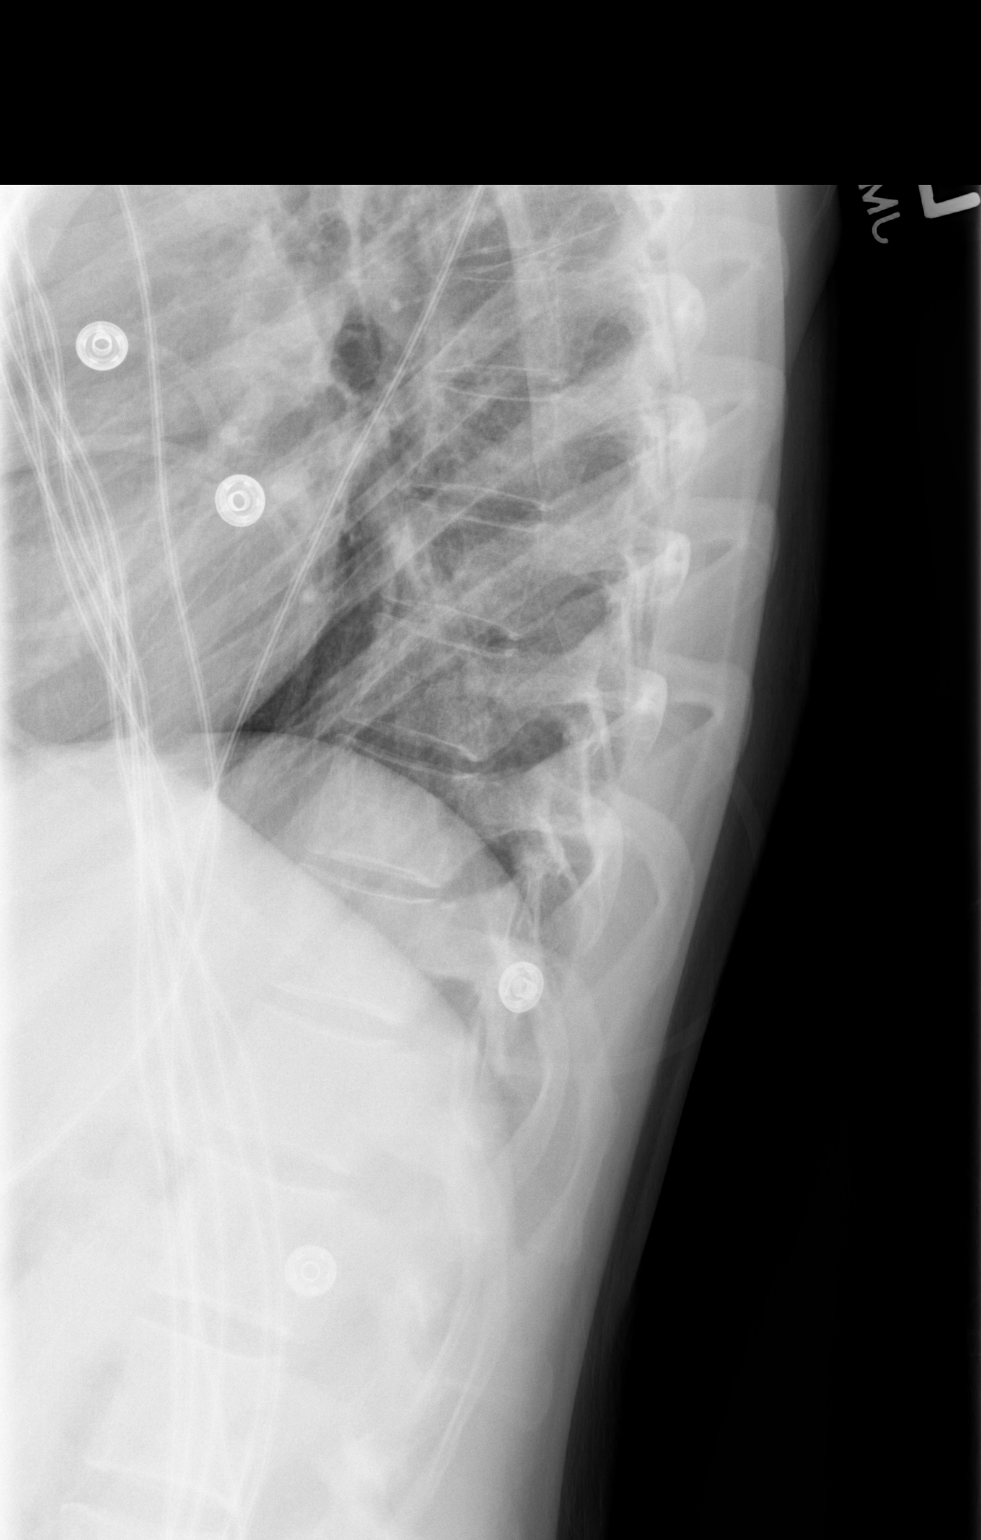

[t swimmers]
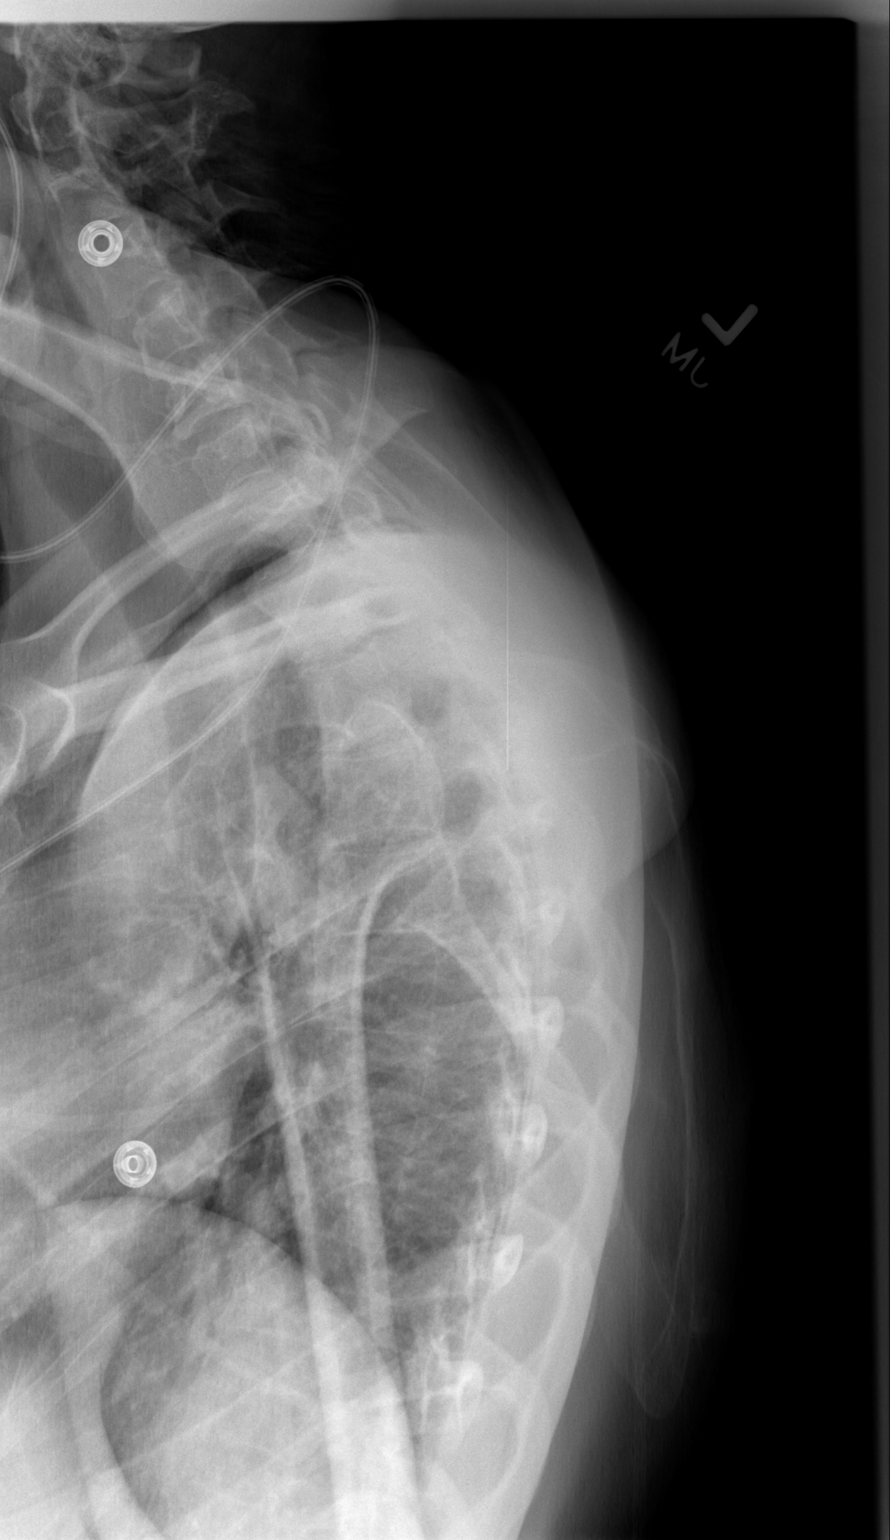

[3 of 3 positions shown; findings below may reference images not displayed]

FINDINGS: There is a mild scoliosis deformity which is convex to
the right.

The vertebral body heights appear well preserved.

No fractures are noted.
IMPRESSION: 1.  No acute changes.
2.  Scoliosis.

## 2012-05-04 IMAGING — CR DG SINUSES COMPLETE 3+V
4 series · 4 of 4 positions shown · non-contrast
Comparison: CT scan dated 01/27/2010

CLINICAL DATA: Facial swelling.

PARANASAL SINUSES - COMPLETE 3 + VIEW

[[person_name] *]
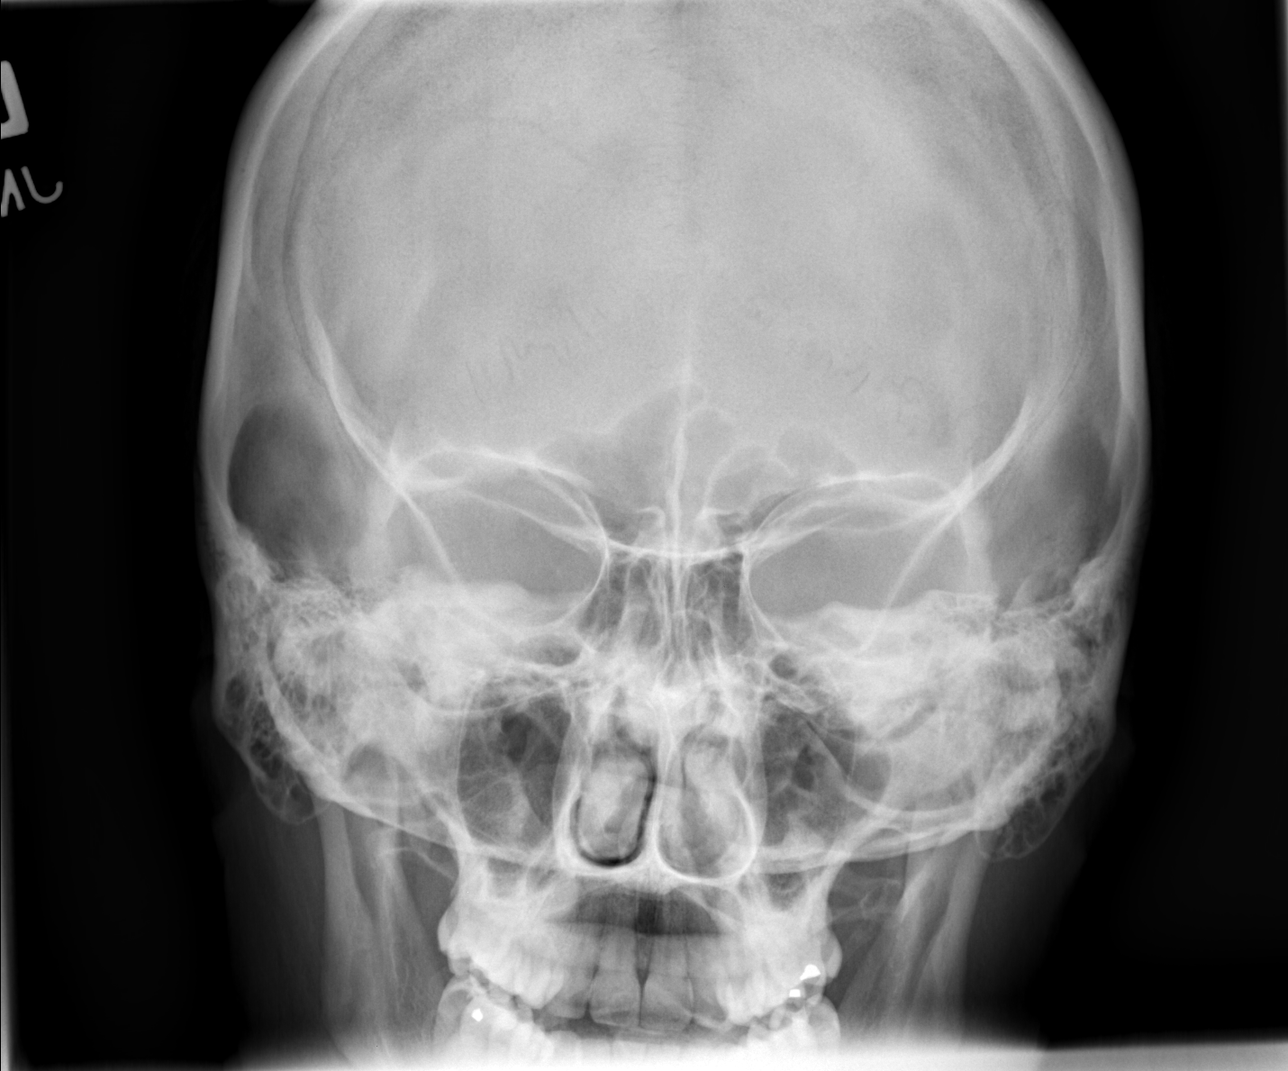

[w waters *]
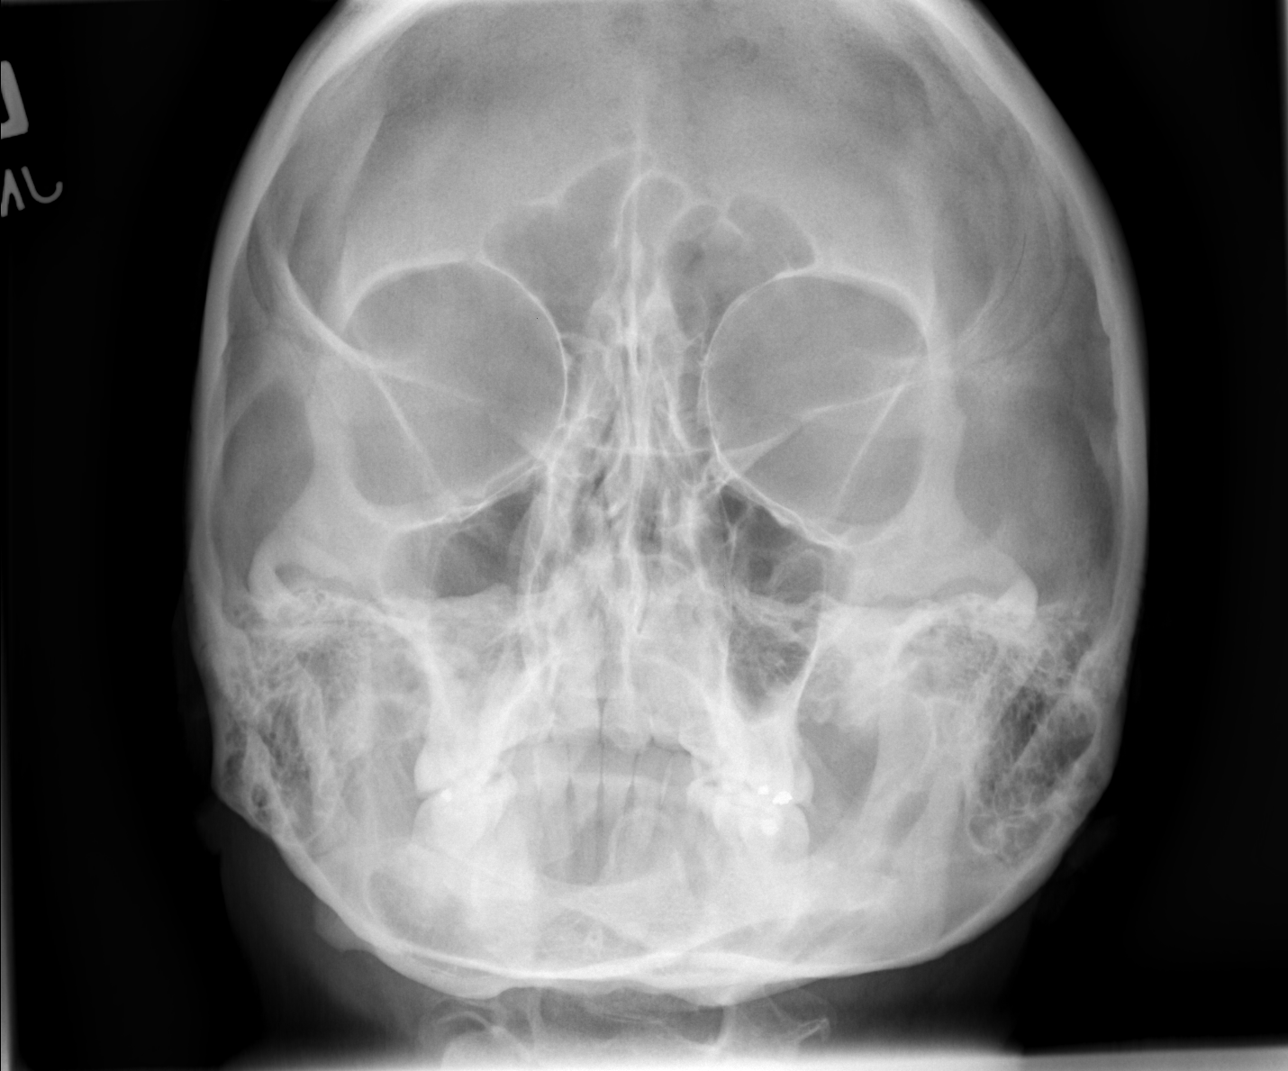

[w skull lat]
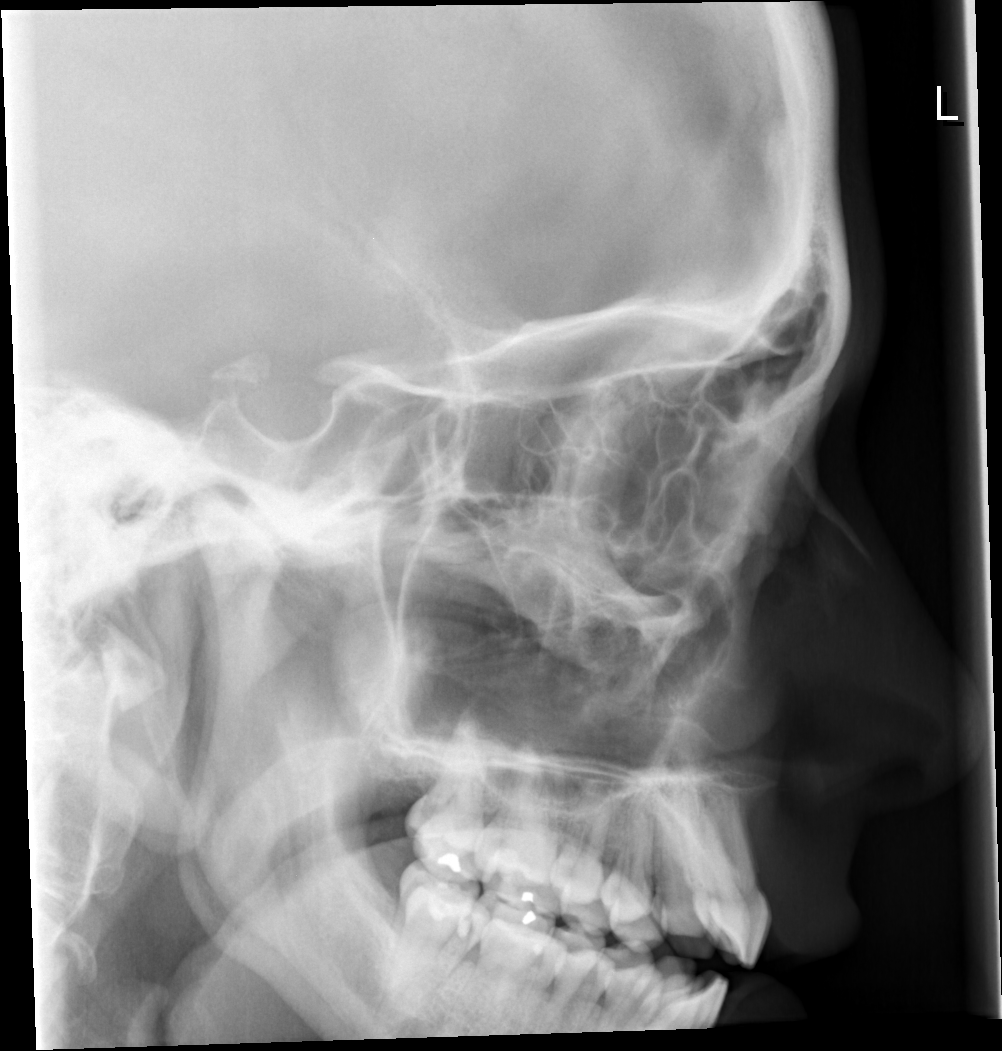

[w smv *]
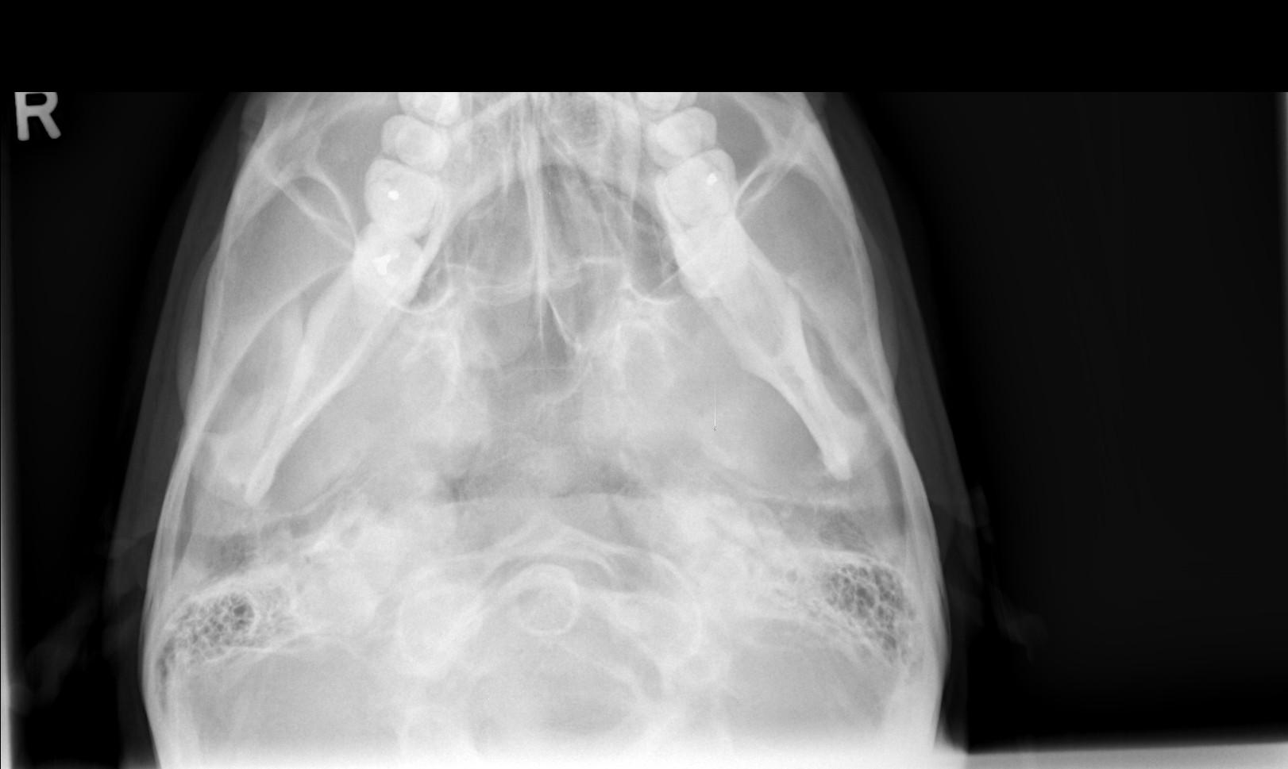

[4 of 4 positions shown; findings below may reference images not displayed]

FINDINGS: There is slight nasal septal deviation from left to right
anteriorly and inferiorly.  The paranasal sinuses appear clear on
these radiographs.  The mastoid air cells are clear.
IMPRESSION: 1.  Normal paranasal sinuses.
2.  Slight nasal septal deviation from left to right anteriorly.

## 2012-06-15 ENCOUNTER — Other Ambulatory Visit: Payer: Self-pay | Admitting: Internal Medicine

## 2012-06-15 ENCOUNTER — Other Ambulatory Visit (HOSPITAL_COMMUNITY)
Admission: RE | Admit: 2012-06-15 | Discharge: 2012-06-15 | Disposition: A | Discharge status: Home / Self Care | Payer: Managed Care, Other (non HMO) | Source: Ambulatory Visit | Attending: Internal Medicine | Admitting: Internal Medicine

## 2012-06-15 ENCOUNTER — Ambulatory Visit (INDEPENDENT_AMBULATORY_CARE_PROVIDER_SITE_OTHER): Payer: Managed Care, Other (non HMO) | Admitting: Internal Medicine

## 2012-06-15 VITALS — BP 117/75 | HR 96 | Temp 98.1°F | Resp 16 | Ht 62.5 in | Wt 110.4 lb

## 2012-06-15 DIAGNOSIS — F411 Generalized anxiety disorder: Secondary | ICD-10-CM

## 2012-06-15 DIAGNOSIS — E271 Primary adrenocortical insufficiency: Secondary | ICD-10-CM

## 2012-06-15 DIAGNOSIS — F419 Anxiety disorder, unspecified: Secondary | ICD-10-CM

## 2012-06-15 DIAGNOSIS — Z01419 Encounter for gynecological examination (general) (routine) without abnormal findings: Secondary | ICD-10-CM | POA: Insufficient documentation

## 2012-06-15 DIAGNOSIS — Z113 Encounter for screening for infections with a predominantly sexual mode of transmission: Secondary | ICD-10-CM | POA: Insufficient documentation

## 2012-06-15 DIAGNOSIS — E109 Type 1 diabetes mellitus without complications: Secondary | ICD-10-CM

## 2012-06-15 DIAGNOSIS — N898 Other specified noninflammatory disorders of vagina: Secondary | ICD-10-CM

## 2012-06-15 DIAGNOSIS — Z0000 Encounter for general adult medical examination without abnormal findings: Secondary | ICD-10-CM

## 2012-06-15 DIAGNOSIS — G4761 Periodic limb movement disorder: Secondary | ICD-10-CM | POA: Insufficient documentation

## 2012-06-15 DIAGNOSIS — Z30011 Encounter for initial prescription of contraceptive pills: Secondary | ICD-10-CM

## 2012-06-15 DIAGNOSIS — Z7251 High risk heterosexual behavior: Secondary | ICD-10-CM

## 2012-06-15 LAB — POCT CBC
Hemoglobin: 14.8 g/dL (ref 12.2–16.2)
MCH, POC: 31.2 pg (ref 27–31.2)
MCV: 97.9 fL — AB (ref 80–97)
MPV: 9.3 fL (ref 0–99.8)
POC MID %: 5 %M (ref 0–12)
RBC: 4.74 M/uL (ref 4.04–5.48)
WBC: 13.8 10*3/uL — AB (ref 4.6–10.2)

## 2012-06-15 LAB — POCT WET PREP WITH KOH: Yeast Wet Prep HPF POC: NEGATIVE

## 2012-06-15 LAB — POCT URINALYSIS DIPSTICK
Bilirubin, UA: NEGATIVE
Glucose, UA: 1000
Ketones, UA: NEGATIVE
Leukocytes, UA: NEGATIVE
Protein, UA: NEGATIVE
Spec Grav, UA: <=1.005

## 2012-06-15 MED ORDER — NORETHINDRONE 0.35 MG PO TABS: 1.0000 | ORAL_TABLET | Freq: Every day | ORAL | Status: DC

## 2012-06-15 MED ORDER — CITALOPRAM HYDROBROMIDE 20 MG PO TABS: 20.0000 mg | ORAL_TABLET | Freq: Every day | ORAL | Status: DC

## 2012-06-15 NOTE — Progress Notes (Signed)
[address], [place] [address]   Phone [phone]  Subjective:    Patient ID: [name], female    DOB: Apr 01, 1981, 31 y.o.   MRN: [id number]  HPI Pt presents to clinic for CPE.  She has insulin dependent diabetes which is not controlled (hospitalized 7/13 for DKA) and Addison disease which was diagnosed about 6 years. She sees an endocrinologist for these problems.  She has not had a pap since 2012 which was normal.  She has had an abnl pap in 2008 with HPV but it was treated with biopsy only.  In a new sexual relationship and would like to get started on OCP.    Review of Systems  Constitutional: Positive for fatigue (not new). Negative for fever, chills, diaphoresis, activity change, appetite change and unexpected weight change.  HENT: Negative.   Eyes: Negative.   Respiratory: Negative.   Cardiovascular: Negative.   Gastrointestinal: Positive for constipation (over the last 1-2 wks).  Genitourinary: Positive for vaginal pain and pelvic pain (no h/o HSV, she tried to have sex for the 1st time on Friday with her new partner and it was to painful for her to allow full penetration - felt like she was stretching and tearing.  No pain since.). Negative for dysuria, urgency, frequency, hematuria, flank pain, decreased urine volume, vaginal bleeding, vaginal discharge (has had problems with yeast in the past with uncontrolled DM.), enuresis, difficulty urinating, genital sores, menstrual problem and dyspareunia.  Skin: Negative.   Neurological: Negative.   Hematological: Negative.   Psychiatric/Behavioral: The patient is nervous/anxious (needs refills on her Celexa - feels like it helps).        Objective:   Physical Exam  Vitals reviewed. Constitutional: She is oriented to person, place, and time. She appears well-developed and well-nourished.  HENT:  Head: Normocephalic and atraumatic.  Right Ear: Hearing, tympanic membrane and ear canal normal.  Left Ear: Hearing, tympanic  membrane, external ear and ear canal normal.  Nose: Nose normal.  Mouth/Throat: Uvula is midline, oropharynx is clear and moist and mucous membranes are normal. No oropharyngeal exudate.       R ear canal - erythema with slight scaling  Eyes: Conjunctivae normal and EOM are normal. Pupils are equal, round, and reactive to light.  Neck: Normal range of motion. Neck supple. No thyromegaly present.  Cardiovascular: Normal rate, regular rhythm and normal heart sounds.   No murmur heard. Pulmonary/Chest: Effort normal and breath sounds normal.  Abdominal: Soft. Bowel sounds are normal.  Genitourinary: No breast swelling, tenderness, discharge or bleeding. Pelvic exam was performed with patient supine. There is no rash, tenderness, lesion or injury on the right labia. There is no rash, tenderness, lesion or injury on the left labia. There is erythema and tenderness around the vagina. No bleeding around the vagina. No signs of injury around the vagina. Vaginal discharge (thick white clumping d/c - not obvious yeast) found.       1-Fibrocystic breast 2- posterior fornix of vaginal opening a 1x0.5cm lesion that appears macerated but upon palpation it is dry and skin feels thickened - minimal TTP (pt able to tolerated speculum exam) - no satellite lesions  Musculoskeletal: Normal range of motion.  Lymphadenopathy:    She has no cervical adenopathy.  Neurological: She is alert and oriented to person, place, and time. She has normal reflexes. No cranial nerve deficit.  Skin: Skin is warm and dry.  Psychiatric: She has a normal mood and affect. Her behavior is normal.  Judgment and thought content normal.    Results for orders placed in visit on 06/15/12  POCT CBC      Component Value Range   WBC 13.8 (*) 4.6 - 10.2 K/uL   Lymph, poc 2.2  0.6 - 3.4   POC LYMPH PERCENT 16.2  10 - 50 %L   MID (cbc) 0.7  0 - 0.9   POC MID % 5.0  0 - 12 %M   POC Granulocyte 10.9 (*) 2 - 6.9   Granulocyte percent 78.8  37  - 80 %G   RBC 4.74  4.04 - 5.48 M/uL   Hemoglobin 14.8  12.2 - 16.2 g/dL   HCT, POC 16.1  09.6 - 47.9 %   MCV 97.9 (*) 80 - 97 fL   MCH, POC 31.2  27 - 31.2 pg   MCHC 31.9  31.8 - 35.4 g/dL   RDW, POC 04.5     Platelet Count, POC 388  142 - 424 K/uL   MPV 9.3  0 - 99.8 fL  POCT URINALYSIS DIPSTICK      Component Value Range   Color, UA lt yellow     Clarity, UA clear     Glucose, UA 1000     Bilirubin, UA neg     Ketones, UA neg     Spec Grav, UA <=1.005     Blood, UA 0-2     pH, UA 6.0     Protein, UA neg     Urobilinogen, UA 0.2     Nitrite, UA neg     Leukocytes, UA Negative    POCT WET PREP WITH KOH      Component Value Range   Trichomonas, UA Negative     Clue Cells Wet Prep HPF POC neg     Epithelial Wet Prep HPF POC 0-2     Yeast Wet Prep HPF POC neg     Bacteria Wet Prep HPF POC trace     RBC Wet Prep HPF POC 0-2     WBC Wet Prep HPF POC 0-2 with clumps     KOH Prep POC Negative           Assessment & Plan:   1. Annual physical exam  POCT CBC, Lipid panel, Comprehensive metabolic panel, POCT urinalysis dipstick, POCT Wet Prep with KOH  2. Anxiety  citalopram (CELEXA) 20 MG tablet  3. High risk sexual behavior  Hepatitis B surface antibody, Hepatitis B surface antigen, Hepatitis C antibody, HIV antibody, HSV 2 antibody, IgG, RPR  4. Routine gynecological examination  Pap IG, CT/NG w/ reflex HPV when ASC-U  5. 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 Family planning, BCP (birth control pills) initial prescription  norethindrone (ERRIN) 0.35 MG tablet  7. DM type 1 (diabetes mellitus, type 1)    8. Addison's disease     1- routine screening done - 2- refilled meds 3- check STDs 4- check pap 5- unsure of etiology of lesion - healing HSV vs fissure from pressure vs other - pt will recheck in 1 month and if lesion is still there will need a biopsy - RTC earlier is worsening pain - think her pain with intercourse  is related to this lesion and partner size change - pt can use OTC hydrocortisone cream nightly for about 1 wk to help increase healing time 6- start OCP - pt has been on before so she understands risks and how  to take 7- pt really needs to try to keep DM under better control - pt to continue care with endo 8- cont care with endo  D/w Dr Merla Riches I have reviewed and agree with documentation. Robert P. Merla Riches, M.D.

## 2012-06-16 ENCOUNTER — Telehealth: Payer: Self-pay

## 2012-06-16 LAB — HEPATITIS C ANTIBODY: HCV Ab: NEGATIVE

## 2012-06-16 LAB — COMPREHENSIVE METABOLIC PANEL
Alkaline Phosphatase: 95 U/L (ref 39–117)
Creat: 0.96 mg/dL (ref 0.50–1.10)
Glucose, Bld: 607 mg/dL (ref 70–99)
Sodium: 128 mEq/L — ABNORMAL LOW (ref 135–145)
Total Bilirubin: 0.3 mg/dL (ref 0.3–1.2)
Total Protein: 6.9 g/dL (ref 6.0–8.3)

## 2012-06-16 LAB — LIPID PANEL
HDL: 69 mg/dL (ref >39)
Total CHOL/HDL Ratio: 2.5 Ratio
Triglycerides: 176 mg/dL — ABNORMAL HIGH (ref <150)

## 2012-06-16 NOTE — Telephone Encounter (Signed)
Maralyn Sago, Lorain ChildesLoney Loh called with a critical glucose @ 830pm. Herbert Seta made aware and said it was ok to wait for you to address tomorrow.

## 2012-06-17 ENCOUNTER — Telehealth: Payer: Self-pay | Admitting: Radiology

## 2012-06-17 LAB — HSV(HERPES SIMPLEX VRS) I + II AB-IGG: HSV 1 Glycoprotein G Ab, IgG: 11.71 IV — ABNORMAL HIGH

## 2012-06-17 LAB — HSV(HERPES SIMPLEX VRS) I + II AB-IGM: Herpes Simplex Vrs I&II-IgM Ab (EIA): 3 INDEX — ABNORMAL HIGH

## 2012-06-17 MED ORDER — VALACYCLOVIR HCL 1 G PO TABS: 1000.0000 mg | ORAL_TABLET | Freq: Two times a day (BID) | ORAL | Status: DC

## 2012-06-17 NOTE — Telephone Encounter (Signed)
Per Benny Lennert--  Call pt and let her know her glucose came back to be 607 so she needs to call her endocrinologist. Diabetes under severe poor control.  Also, her labs came back to show antibodies against herpes so she will need a medicine called in for her that she will take twice per day for 7 days.   Called pt on her cell and lmom for her to cb so we can give her this information.

## 2012-06-17 NOTE — Telephone Encounter (Signed)
Rx sent to pharmacy   

## 2012-06-17 NOTE — Telephone Encounter (Signed)
Will call patient and check on her.  She should monitor her glucose more closely and call her endo - she had no ketones in her urine at her visit.

## 2012-06-17 NOTE — Telephone Encounter (Signed)
Advised pt of her lab results but need rx for hsv sent in to her pharmacy

## 2012-06-23 ENCOUNTER — Encounter: Payer: Self-pay | Admitting: *Deleted

## 2012-08-23 ENCOUNTER — Inpatient Hospital Stay (HOSPITAL_COMMUNITY)
Admission: EM | Admit: 2012-08-23 | Discharge: 2012-08-26 | DRG: 637 | Disposition: A | Discharge status: Home / Self Care | Payer: Managed Care, Other (non HMO) | Attending: Emergency Medicine | Admitting: Emergency Medicine

## 2012-08-23 DIAGNOSIS — R571 Hypovolemic shock: Secondary | ICD-10-CM

## 2012-08-23 DIAGNOSIS — E1010 Type 1 diabetes mellitus with ketoacidosis without coma: Principal | ICD-10-CM | POA: Diagnosis present

## 2012-08-23 DIAGNOSIS — G2581 Restless legs syndrome: Secondary | ICD-10-CM

## 2012-08-23 DIAGNOSIS — Z881 Allergy status to other antibiotic agents status: Secondary | ICD-10-CM

## 2012-08-23 DIAGNOSIS — R112 Nausea with vomiting, unspecified: Secondary | ICD-10-CM

## 2012-08-23 DIAGNOSIS — D72829 Elevated white blood cell count, unspecified: Secondary | ICD-10-CM | POA: Diagnosis present

## 2012-08-23 DIAGNOSIS — E271 Primary adrenocortical insufficiency: Secondary | ICD-10-CM

## 2012-08-23 DIAGNOSIS — T380X5A Adverse effect of glucocorticoids and synthetic analogues, initial encounter: Secondary | ICD-10-CM | POA: Diagnosis present

## 2012-08-23 DIAGNOSIS — E1065 Type 1 diabetes mellitus with hyperglycemia: Secondary | ICD-10-CM

## 2012-08-23 DIAGNOSIS — R509 Fever, unspecified: Secondary | ICD-10-CM

## 2012-08-23 DIAGNOSIS — F419 Anxiety disorder, unspecified: Secondary | ICD-10-CM

## 2012-08-23 DIAGNOSIS — G9340 Encephalopathy, unspecified: Secondary | ICD-10-CM

## 2012-08-23 DIAGNOSIS — IMO0002 Reserved for concepts with insufficient information to code with codable children: Secondary | ICD-10-CM

## 2012-08-23 DIAGNOSIS — E860 Dehydration: Secondary | ICD-10-CM

## 2012-08-23 DIAGNOSIS — Y92009 Unspecified place in unspecified non-institutional (private) residence as the place of occurrence of the external cause: Secondary | ICD-10-CM

## 2012-08-23 DIAGNOSIS — E1110 Type 2 diabetes mellitus with ketoacidosis without coma: Secondary | ICD-10-CM

## 2012-08-23 DIAGNOSIS — R4182 Altered mental status, unspecified: Secondary | ICD-10-CM

## 2012-08-23 DIAGNOSIS — E871 Hypo-osmolality and hyponatremia: Secondary | ICD-10-CM

## 2012-08-24 ENCOUNTER — Emergency Department (HOSPITAL_COMMUNITY): Payer: Managed Care, Other (non HMO)

## 2012-08-24 ENCOUNTER — Encounter (HOSPITAL_COMMUNITY): Payer: Self-pay | Admitting: Internal Medicine

## 2012-08-24 DIAGNOSIS — G9340 Encephalopathy, unspecified: Secondary | ICD-10-CM

## 2012-08-24 DIAGNOSIS — E1110 Type 2 diabetes mellitus with ketoacidosis without coma: Secondary | ICD-10-CM

## 2012-08-24 DIAGNOSIS — E2749 Other adrenocortical insufficiency: Secondary | ICD-10-CM

## 2012-08-24 DIAGNOSIS — E860 Dehydration: Secondary | ICD-10-CM

## 2012-08-24 LAB — CBC WITH DIFFERENTIAL/PLATELET
Basophils Absolute: 0 10*3/uL (ref 0.0–0.1)
Eosinophils Absolute: 0 10*3/uL (ref 0.0–0.7)
Eosinophils Relative: 0 % (ref 0–5)
Eosinophils Relative: 0 % (ref 0–5)
HCT: 39 % (ref 36.0–46.0)
Hemoglobin: 14.4 g/dL (ref 12.0–15.0)
Lymphocytes Relative: 14 % (ref 12–46)
Lymphs Abs: 2.4 10*3/uL (ref 0.7–4.0)
Lymphs Abs: 1.6 10*3/uL (ref 0.7–4.0)
MCH: 32.4 pg (ref 26.0–34.0)
MCV: 87.8 fL (ref 78.0–100.0)
Monocytes Relative: 7 % (ref 3–12)
Monocytes Relative: 6 % (ref 3–12)
Neutrophils Relative %: 79 % — ABNORMAL HIGH (ref 43–77)
Platelets: 316 10*3/uL (ref 150–400)
Platelets: 291 10*3/uL (ref 150–400)
RBC: 5.45 MIL/uL — ABNORMAL HIGH (ref 3.87–5.11)
RBC: 4.44 MIL/uL (ref 3.87–5.11)
RDW: 12.1 % (ref 11.5–15.5)
WBC: 16.9 10*3/uL — ABNORMAL HIGH (ref 4.0–10.5)
WBC: 11.2 10*3/uL — ABNORMAL HIGH (ref 4.0–10.5)

## 2012-08-24 LAB — BASIC METABOLIC PANEL
BUN: 19 mg/dL (ref 6–23)
BUN: 19 mg/dL (ref 6–23)
BUN: 14 mg/dL (ref 6–23)
CO2: 14 mEq/L — ABNORMAL LOW (ref 19–32)
CO2: 18 mEq/L — ABNORMAL LOW (ref 19–32)
CO2: 17 mEq/L — ABNORMAL LOW (ref 19–32)
Calcium: 10.1 mg/dL (ref 8.4–10.5)
Calcium: 8.8 mg/dL (ref 8.4–10.5)
Calcium: 8 mg/dL — ABNORMAL LOW (ref 8.4–10.5)
Calcium: 7.8 mg/dL — ABNORMAL LOW (ref 8.4–10.5)
Chloride: 88 mEq/L — ABNORMAL LOW (ref 96–112)
Chloride: 100 mEq/L (ref 96–112)
Chloride: 96 mEq/L (ref 96–112)
Chloride: 99 mEq/L (ref 96–112)
Creatinine, Ser: 0.8 mg/dL (ref 0.50–1.10)
Creatinine, Ser: 0.61 mg/dL (ref 0.50–1.10)
Creatinine, Ser: 0.54 mg/dL (ref 0.50–1.10)
GFR calc Af Amer: >90 mL/min (ref >90)
GFR calc Af Amer: >90 mL/min (ref >90)
GFR calc Af Amer: >90 mL/min (ref >90)
GFR calc Af Amer: >90 mL/min (ref >90)
GFR calc non Af Amer: >90 mL/min (ref >90)
GFR calc non Af Amer: >90 mL/min (ref >90)
GFR calc non Af Amer: >90 mL/min (ref >90)
GFR calc non Af Amer: >90 mL/min (ref >90)
Glucose, Bld: 175 mg/dL — ABNORMAL HIGH (ref 70–99)
Glucose, Bld: 146 mg/dL — ABNORMAL HIGH (ref 70–99)
Potassium: 4 mEq/L (ref 3.5–5.1)
Potassium: 4.9 mEq/L (ref 3.5–5.1)
Potassium: 5.9 mEq/L — ABNORMAL HIGH (ref 3.5–5.1)
Potassium: 4.5 mEq/L (ref 3.5–5.1)
Potassium: 4 mEq/L (ref 3.5–5.1)
Sodium: 128 mEq/L — ABNORMAL LOW (ref 135–145)
Sodium: 133 mEq/L — ABNORMAL LOW (ref 135–145)
Sodium: 129 mEq/L — ABNORMAL LOW (ref 135–145)
Sodium: 127 mEq/L — ABNORMAL LOW (ref 135–145)
Sodium: 125 mEq/L — ABNORMAL LOW (ref 135–145)

## 2012-08-24 LAB — GLUCOSE, CAPILLARY
Glucose-Capillary: 139 mg/dL — ABNORMAL HIGH (ref 70–99)
Glucose-Capillary: 103 mg/dL — ABNORMAL HIGH (ref 70–99)
Glucose-Capillary: 150 mg/dL — ABNORMAL HIGH (ref 70–99)
Glucose-Capillary: 326 mg/dL — ABNORMAL HIGH (ref 70–99)
Glucose-Capillary: 164 mg/dL — ABNORMAL HIGH (ref 70–99)

## 2012-08-24 LAB — BLOOD GAS, VENOUS
Acid-base deficit: 9 mmol/L — ABNORMAL HIGH (ref 0.0–2.0)
FIO2: 0.21 %
O2 Saturation: 98.6 %

## 2012-08-24 LAB — BLOOD GAS, ARTERIAL
Bicarbonate: 10 mEq/L — ABNORMAL LOW (ref 20.0–24.0)
Patient temperature: 98.6
TCO2: 9.1 mmol/L (ref 0–100)
pH, Arterial: 7.318 — ABNORMAL LOW (ref 7.350–7.450)

## 2012-08-24 LAB — URINALYSIS, ROUTINE W REFLEX MICROSCOPIC
Glucose, UA: >1000 mg/dL — AB
Ketones, ur: >80 mg/dL — AB
Leukocytes, UA: NEGATIVE
Nitrite: NEGATIVE
Protein, ur: 30 mg/dL — AB
pH: 5 (ref 5.0–8.0)

## 2012-08-24 LAB — RAPID URINE DRUG SCREEN, HOSP PERFORMED
Barbiturates: NOT DETECTED
Benzodiazepines: NOT DETECTED
Cocaine: NOT DETECTED
Opiates: NOT DETECTED
Tetrahydrocannabinol: NOT DETECTED

## 2012-08-24 LAB — TROPONIN I: Troponin I: <0.3 ng/mL (ref <0.30)

## 2012-08-24 LAB — HEPATIC FUNCTION PANEL
AST: 18 U/L (ref 0–37)
Albumin: 3.1 g/dL — ABNORMAL LOW (ref 3.5–5.2)
Total Bilirubin: 0.3 mg/dL (ref 0.3–1.2)
Total Protein: 6.4 g/dL (ref 6.0–8.3)

## 2012-08-24 LAB — TSH: TSH: 4.521 u[IU]/mL — ABNORMAL HIGH (ref 0.350–4.500)

## 2012-08-24 LAB — AMMONIA: Ammonia: 24 umol/L (ref 11–60)

## 2012-08-24 LAB — INFLUENZA PANEL BY PCR (TYPE A & B): H1N1 flu by pcr: NOT DETECTED

## 2012-08-24 LAB — MRSA PCR SCREENING: MRSA by PCR: NEGATIVE

## 2012-08-24 LAB — KETONES, QUALITATIVE

## 2012-08-24 LAB — URINE MICROSCOPIC-ADD ON

## 2012-08-24 MED ORDER — INSULIN GLARGINE 100 UNIT/ML ~~LOC~~ SOLN: 12.0000 [IU] | Freq: Once | SUBCUTANEOUS | Status: DC

## 2012-08-24 MED ORDER — DEXTROSE 5 % IV SOLN
2.0000 g | Freq: Three times a day (TID) | INTRAVENOUS | Status: DC
  Administered 2012-08-24 – 2012-08-26 (×7): 2 g via INTRAVENOUS
  Filled 2012-08-24 (×8): qty 2

## 2012-08-24 MED ORDER — HYDROCORTISONE SOD SUCCINATE 100 MG IJ SOLR
50.0000 mg | Freq: Three times a day (TID) | INTRAMUSCULAR | Status: DC
  Administered 2012-08-24: 50 mg via INTRAVENOUS
  Filled 2012-08-24 (×3): qty 1

## 2012-08-24 MED ORDER — HYDROCORTISONE 10 MG PO TABS
10.0000 mg | ORAL_TABLET | Freq: Two times a day (BID) | ORAL | Status: DC
  Administered 2012-08-24 – 2012-08-26 (×4): 10 mg via ORAL
  Filled 2012-08-24 (×5): qty 1

## 2012-08-24 MED ORDER — DEXTROSE-NACL 5-0.45 % IV SOLN: INTRAVENOUS | Status: DC

## 2012-08-24 MED ORDER — VANCOMYCIN HCL 1000 MG IV SOLR
750.0000 mg | Freq: Three times a day (TID) | INTRAVENOUS | Status: DC
  Administered 2012-08-24 – 2012-08-26 (×7): 750 mg via INTRAVENOUS
  Filled 2012-08-24 (×8): qty 750

## 2012-08-24 MED ORDER — SODIUM CHLORIDE 0.9 % IV BOLUS (SEPSIS)
1000.0000 mL | Freq: Once | INTRAVENOUS | Status: AC
  Administered 2012-08-24: 1000 mL via INTRAVENOUS

## 2012-08-24 MED ORDER — INSULIN ASPART 100 UNIT/ML ~~LOC~~ SOLN
15.0000 [IU] | Freq: Once | SUBCUTANEOUS | Status: AC
  Administered 2012-08-24: 15 [IU] via SUBCUTANEOUS
  Filled 2012-08-24: qty 1

## 2012-08-24 MED ORDER — SODIUM CHLORIDE 0.9 % IV SOLN
INTRAVENOUS | Status: DC
  Administered 2012-08-24: 1.8 [IU]/h via INTRAVENOUS
  Filled 2012-08-24 (×2): qty 1

## 2012-08-24 MED ORDER — INSULIN REGULAR BOLUS VIA INFUSION
0.0000 [IU] | Freq: Three times a day (TID) | INTRAVENOUS | Status: DC
  Filled 2012-08-24: qty 10

## 2012-08-24 MED ORDER — OXYCODONE-ACETAMINOPHEN 5-325 MG PO TABS
1.0000 | ORAL_TABLET | Freq: Four times a day (QID) | ORAL | Status: DC | PRN
  Administered 2012-08-24 – 2012-08-26 (×4): 1 via ORAL
  Filled 2012-08-24 (×5): qty 1

## 2012-08-24 MED ORDER — ONDANSETRON HCL 4 MG/2ML IJ SOLN
4.0000 mg | Freq: Once | INTRAMUSCULAR | Status: DC
  Filled 2012-08-24: qty 2

## 2012-08-24 MED ORDER — SODIUM CHLORIDE 0.9 % IV SOLN
INTRAVENOUS | Status: DC
  Administered 2012-08-24 – 2012-08-25 (×3): via INTRAVENOUS
  Administered 2012-08-26: 150 mL/h via INTRAVENOUS

## 2012-08-24 MED ORDER — POTASSIUM CHLORIDE 10 MEQ/100ML IV SOLN
10.0000 meq | INTRAVENOUS | Status: AC
  Filled 2012-08-24 (×4): qty 100

## 2012-08-24 MED ORDER — DEXTROSE 50 % IV SOLN: 25.0000 mL | INTRAVENOUS | Status: DC | PRN

## 2012-08-24 MED ORDER — INSULIN GLARGINE 100 UNIT/ML ~~LOC~~ SOLN
20.0000 [IU] | Freq: Once | SUBCUTANEOUS | Status: AC
  Administered 2012-08-25: 20 [IU] via SUBCUTANEOUS

## 2012-08-24 MED ORDER — HYDROCORTISONE SOD SUCCINATE 100 MG IJ SOLR
100.0000 mg | Freq: Once | INTRAMUSCULAR | Status: AC
  Administered 2012-08-24: 100 mg via INTRAVENOUS
  Filled 2012-08-24: qty 2

## 2012-08-24 MED ORDER — POTASSIUM CHLORIDE 10 MEQ/100ML IV SOLN: 10.0000 meq | INTRAVENOUS | Status: DC

## 2012-08-24 MED ORDER — ROPINIROLE HCL 0.25 MG PO TABS
0.2500 mg | ORAL_TABLET | Freq: Every day | ORAL | Status: DC
  Administered 2012-08-24 – 2012-08-25 (×2): 0.25 mg via ORAL
  Filled 2012-08-24 (×3): qty 1

## 2012-08-24 MED ORDER — SODIUM CHLORIDE 0.9 % IV SOLN: INTRAVENOUS | Status: DC

## 2012-08-24 MED ORDER — ONDANSETRON 4 MG PO TBDP
4.0000 mg | ORAL_TABLET | Freq: Once | ORAL | Status: AC
  Administered 2012-08-24: 4 mg via ORAL
  Filled 2012-08-24: qty 1

## 2012-08-24 MED ORDER — ENOXAPARIN SODIUM 40 MG/0.4ML ~~LOC~~ SOLN: 40.0000 mg | SUBCUTANEOUS | Status: DC

## 2012-08-24 MED ORDER — DEXTROSE-NACL 5-0.45 % IV SOLN
INTRAVENOUS | Status: DC
  Administered 2012-08-24 (×2): via INTRAVENOUS

## 2012-08-24 MED ORDER — DEXTROSE 50 % IV SOLN
25.0000 mL | INTRAVENOUS | Status: DC | PRN
  Administered 2012-08-26: 12 mL via INTRAVENOUS
  Filled 2012-08-24: qty 50

## 2012-08-24 MED ORDER — SODIUM CHLORIDE 0.9 % IV SOLN
INTRAVENOUS | Status: DC
  Filled 2012-08-24: qty 1

## 2012-08-24 MED ORDER — DEXTROSE-NACL 5-0.9 % IV SOLN
INTRAVENOUS | Status: DC
  Administered 2012-08-24: 06:00:00 via INTRAVENOUS

## 2012-08-24 MED ORDER — SODIUM CHLORIDE 0.9 % IV SOLN
INTRAVENOUS | Status: DC
  Administered 2012-08-24: 0.8 [IU]/h via INTRAVENOUS
  Filled 2012-08-24: qty 1

## 2012-08-24 MED ORDER — CITALOPRAM HYDROBROMIDE 20 MG PO TABS
20.0000 mg | ORAL_TABLET | Freq: Every day | ORAL | Status: DC
  Administered 2012-08-24 – 2012-08-26 (×3): 20 mg via ORAL
  Filled 2012-08-24 (×3): qty 1

## 2012-08-24 MED ORDER — INSULIN REGULAR HUMAN 100 UNIT/ML IJ SOLN: 15.0000 [IU] | Freq: Once | INTRAMUSCULAR | Status: DC

## 2012-08-24 MED ORDER — ENOXAPARIN SODIUM 40 MG/0.4ML ~~LOC~~ SOLN
40.0000 mg | SUBCUTANEOUS | Status: DC
  Administered 2012-08-25: 40 mg via SUBCUTANEOUS
  Filled 2012-08-24 (×3): qty 0.4

## 2012-08-24 NOTE — Progress Notes (Signed)
Inpatient Diabetes Program Recommendations  AACE/ADA: New Consensus Statement on Inpatient Glycemic Control (2013)  Target Ranges:  Prepandial:   less than 140 mg/dL      Peak postprandial:   less than 180 mg/dL (1-2 hours)      Critically ill patients:  140 - 180 mg/dL   Reason for Visit: Pt on GlucoStabilizer using glucose values from lab draws.  Inpatient Diabetes Program Recommendations HgbA1C: Please order. Last value on 08/31/11 ws 14.5%  GlucoStabilizer used in DKA protocol bases IV insulin drip rates on cbg's and not lab glucose values.  Lab glucose values cannot be entered because they are after the fact, i.e. Not true values for the time requested.  CBG's must be gotten per the nurse/tech q 1 hr and must be entered at time of GS alarm.  Values cannot be entered q 2 hrs as the glucose is changing within each hour and cannot safely be managed by the same drip rate for 2 hours.  B-mets are drawn primarily for the acidosis status.  Once pt is out of acidosis, per the DKA order set, the B-mets may be changed to q 4 hrs, then q 8 hrs once stable. Have paged MD without response, then page texted MD with this information.  Pt now being transferred to ICU step-down due to worsening DKA status. Will assure that ICU RN is aware to use cbg values only when entering the glucose into the GS program.  Recommend to continue the IV insulin through the night into the morning and transition once the acidosis is cleared. DKA protocol link outlines the parameters for transition. Please be sure that basal lantus 20 units is given at least 2 hrs prior to discontinuation of the IV insulin drip and that correction insulin is given at the same time the drip is discontinued and then q 4 hrs until eating regularly and stable. Thank you, Lenor Coffin, RN, CNS, Diabetes Coordinator [phone])   Note: ([phone])

## 2012-08-24 NOTE — Progress Notes (Signed)
Attempted to obtain blood specimen for ABG/VBG analysis. Less than 1mL obtained. Attempted analysis via radiometer machine. Insufficient sample. Mother at bedside states she does not feel the patient is "getting the attention she needs and is not happy with this experience". Dr. Lorenso Courier and RN made aware.

## 2012-08-24 NOTE — ED Notes (Signed)
Second RN, Kieth Brightly, RN attempted IV start with aid of ultrasound and was unsuccessful. Will notify physician and request IV team.

## 2012-08-24 NOTE — ED Provider Notes (Signed)
Medical screening examination/treatment/procedure(s) were conducted as a shared visit with non-physician practitioner(s) and myself.  I personally evaluated the patient during the encounter.  Please see my separate procedure and medical decision making note.  Tobin Chad, MD 08/24/12 [phone]

## 2012-08-24 NOTE — ED Notes (Signed)
ECG limits adjusted 125-50

## 2012-08-24 NOTE — ED Notes (Signed)
Difficulty obtaining IV access. Second RN requested to bedside.

## 2012-08-24 NOTE — ED Notes (Signed)
Notified Dr Lorenso Courier patient in need of a central line due to inadequate venous access.

## 2012-08-24 NOTE — Progress Notes (Signed)
Patient transferred to Step Down Unit on hospital bed, alert and oriented, father at bedside, transferred by hospital transporter, patient tolerated well, report given via telephone to Primary receiving Nurse, Insulin Drip infusing at 3.units per hour, primary fluids infusing at 150 cc/hr NS, 3 lumen IJ in Right Neck, patient ambulating to bathroom, voiding clear yellow urine, patient in stable condition at time of transfer.

## 2012-08-24 NOTE — Progress Notes (Signed)
ANTIBIOTIC CONSULT NOTE - INITIAL  Pharmacy Consult for Vancomycin and Azactam Indication: rule out sepsis  Allergies  Allergen Reactions  . Augmentin (Amoxicillin-Pot Clavulanate) Diarrhea    Patient Measurements: Height: 5\' 2"  (157.5 cm) Weight: 113 lb (51.256 kg) IBW/kg (Calculated) : 50.1    Vital Signs: Temp: 100.7 F (38.2 C) (01/20 0443) Temp src: Rectal (01/20 0443) BP: 103/49 mmHg (01/20 0430) Pulse Rate: 129  (01/20 0415) Intake/Output from previous day: 01/19 0701 - 01/20 0700 In: -  Out: 225 [Urine:225] Intake/Output from this shift: Total I/O In: -  Out: 225 [Urine:225]  Labs:  [facility] 08/24/12 0255  WBC 16.9*  HGB 18.0*  PLT 316  LABCREA --  CREATININE 0.80   Estimated Creatinine Clearance: 79.1 ml/min (by C-G formula based on Cr of 0.8). No results found for this basename: VANCOTROUGH:2,VANCOPEAK:2,VANCORANDOM:2,GENTTROUGH:2,GENTPEAK:2,GENTRANDOM:2,TOBRATROUGH:2,TOBRAPEAK:2,TOBRARND:2,AMIKACINPEAK:2,AMIKACINTROU:2,AMIKACIN:2, in the last 72 hours   Microbiology: No results found for this or any previous visit (from the past 720 hour(s)).  Medical History: Past Medical History  Diagnosis Date  . Diabetes mellitus   . Addison disease   . Allergy   . Addison disease     Medications:  Scheduled:    . [COMPLETED] hydrocortisone sodium succinate  100 mg Intravenous Once  . [COMPLETED] insulin aspart  15 Units Subcutaneous Once  . ondansetron  4 mg Intravenous Once  . [COMPLETED] ondansetron  4 mg Oral Once  . [DISCONTINUED] insulin regular  15 Units Subcutaneous Once   Infusions:    . sodium chloride    . aztreonam 2 g (08/24/12 0606)  . dextrose 5 % and 0.9% NaCl    . insulin (NOVOLIN-R) infusion 0.8 Units/hr (08/24/12 0615)  . insulin regular    . [COMPLETED] sodium chloride Stopped (08/24/12 0549)  . vancomycin     Assessment: 32 yo pt with history of DM and Addison's disease admitted with probable Sepsis.  MD ordering Vancomycin  and Azactam per RX.  Goal of Therapy:  Vancomycin trough level 15-20 mcg/ml  Plan:   Azactam 2Gm IV q8h  Vancomycin 750mg  IV q8h.  CrCl~114 (N)  F/U SCr/levels as needed.  Susanne Greenhouse R 08/24/2012,6:19 AM

## 2012-08-24 NOTE — ED Notes (Signed)
Pt CBG was 495 RN made aware.

## 2012-08-24 NOTE — H&P (Addendum)
[name] is an 32 y.o. female.  Patient was seen and examined on August 24, 2012. PCP - [facility]. Endocrinologist - Dr. Allena Katz at [facility].  Chief Complaint: Nausea vomiting. HPI: 32 year old female with history of diabetes mellitus type 1 and Addison disease started experiencing nausea and vomiting last evening and felt uncomfortable at that point she called her mom who was in [place]. Patient's mom reached [facility] and patient was brought to the ER because of the persistent nausea vomiting which typically happens when she gets DKA. By the time patient reached here she became very lethargic. Due to difficult IV insertion right internal jugular vein central line was placed by the ER physician after which fluids were started along with subcutaneous insulin NovoLog 15 units were given. Patient also was given 100 mg of IV hydrocortisone given that patient has Addison's disease. After these measures patient became more alert awake and by the time I examine patient was completely alert and oriented to time place and person. Patient is not very forthcoming with history. On questioning if she had missed her doses she states he does not know. Denies any chest pain or shortness of breath abdominal pain fever chills headache any focal deficits. Patient has been admitted for further management. Initial labs show low bicarbonate with high anion gap suggestive of DKA.  Since patient's initial blood pressures were low and was mildly febrile with leukocytosis empiric antibiotics were started. Blood cultures were sent. UA and chest x-ray are pending. Influenza panel is also pending.  Patient denies having any other medications including salicylates.  Past Medical History  Diagnosis Date  . Diabetes mellitus   . Addison disease   . Allergy   . Addison disease     Past Surgical History  Procedure Date  . Wisdom tooth extraction     Family History  Problem Relation  Age of Onset  . Alzheimer's disease Maternal Grandmother   . Diabetes Paternal Grandmother   . Cancer Paternal Grandfather    Social History:  reports that she has never smoked. She has never used smokeless tobacco. She reports that she drinks about 1.2 ounces of alcohol per week. She reports that she does not use illicit drugs.  Allergies:  Allergies  Allergen Reactions  . Augmentin (Amoxicillin-Pot Clavulanate) Diarrhea     (Not in a hospital admission)  Results for orders placed during the hospital encounter of 08/23/12 (from the past 48 hour(s))  GLUCOSE, CAPILLARY     Status: Abnormal   Collection Time   08/24/12 12:04 AM      Component Value Range Comment   Glucose-Capillary 495 (*) 70 - 99 mg/dL    Comment 1 Documented in Chart      Comment 2 Notify RN     CBC WITH DIFFERENTIAL     Status: Abnormal   Collection Time   08/24/12  2:55 AM      Component Value Range Comment   WBC 16.9 (*) 4.0 - 10.5 K/uL    RBC 5.45 (*) 3.87 - 5.11 MIL/uL    Hemoglobin 18.0 (*) 12.0 - 15.0 g/dL    HCT 16.1 (*) 09.6 - 46.0 %    MCV 88.4  78.0 - 100.0 fL    MCH 33.0  26.0 - 34.0 pg    MCHC 37.3 (*) 30.0 - 36.0 g/dL    RDW 04.5  40.9 - 81.1 %    Platelets 316  150 - 400 K/uL    Neutrophils Relative  79 (*) 43 - 77 %    Lymphocytes Relative 14  12 - 46 %    Monocytes Relative 7  3 - 12 %    Eosinophils Relative 0  0 - 5 %    Basophils Relative 0  0 - 1 %    Neutro Abs 13.3 (*) 1.7 - 7.7 K/uL    Lymphs Abs 2.4  0.7 - 4.0 K/uL    Monocytes Absolute 1.2 (*) 0.1 - 1.0 K/uL    Eosinophils Absolute 0.0  0.0 - 0.7 K/uL    Basophils Absolute 0.0  0.0 - 0.1 K/uL    Smear Review MORPHOLOGY UNREMARKABLE     BASIC METABOLIC PANEL     Status: Abnormal   Collection Time   08/24/12  2:55 AM      Component Value Range Comment   Sodium 128 (*) 135 - 145 mEq/L    Potassium 4.9  3.5 - 5.1 mEq/L    Chloride 88 (*) 96 - 112 mEq/L    CO2 14 (*) 19 - 32 mEq/L    Glucose, Bld 281 (*) 70 - 99 mg/dL    BUN  20  6 - 23 mg/dL    Creatinine, Ser 1.61  0.50 - 1.10 mg/dL    Calcium 09.6  8.4 - 10.5 mg/dL    GFR calc non Af Amer >90  >90 mL/min    GFR calc Af Amer >90  >90 mL/min   BLOOD GAS, VENOUS     Status: Abnormal   Collection Time   08/24/12  5:00 AM      Component Value Range Comment   FIO2 0.21      pH, Ven 7.317 (*) 7.250 - 7.300    pCO2, Ven 31.0 (*) 45.0 - 50.0 mmHg    pO2, Ven 153.0 (*) 30.0 - 45.0 mmHg    Bicarbonate 15.1 (*) 20.0 - 24.0 mEq/L    TCO2 13.1  0 - 100 mmol/L    Acid-base deficit 9.0 (*) 0.0 - 2.0 mmol/L    O2 Saturation 98.6      Patient temperature 100.7      Collection site VEIN      Drawn by COLLECTED BY NURSE      Sample type VEIN     GLUCOSE, CAPILLARY     Status: Abnormal   Collection Time   08/24/12  5:08 AM      Component Value Range Comment   Glucose-Capillary 139 (*) 70 - 99 mg/dL   URINE RAPID DRUG SCREEN (HOSP PERFORMED)     Status: Normal   Collection Time   08/24/12  5:45 AM      Component Value Range Comment   Opiates NONE DETECTED  NONE DETECTED    Cocaine NONE DETECTED  NONE DETECTED    Benzodiazepines NONE DETECTED  NONE DETECTED    Amphetamines NONE DETECTED  NONE DETECTED    Tetrahydrocannabinol NONE DETECTED  NONE DETECTED    Barbiturates NONE DETECTED  NONE DETECTED    Dg Chest Portable 1 View  08/24/2012  *RADIOLOGY REPORT*  Clinical Data: Nausea and vomiting; hyperglycemia.  PORTABLE CHEST - 1 VIEW  Comparison: Chest radiograph performed 01/16/2012  Findings: The lungs are well-aerated and clear.  There is no evidence of focal opacification, pleural effusion or pneumothorax.  The cardiomediastinal silhouette is within normal limits.  A right IJ line is noted ending about the mid SVC.  No acute osseous abnormalities are seen.  IMPRESSION: No acute cardiopulmonary process seen.  Original Report Authenticated By: Tonia Ghent, M.D.     Review of Systems  Constitutional: Negative.   HENT: Negative.   Eyes: Negative.   Respiratory:  Negative.   Cardiovascular: Negative.   Gastrointestinal: Positive for nausea and vomiting.  Genitourinary: Negative.   Musculoskeletal: Negative.   Skin: Negative.   Neurological: Negative.   Endo/Heme/Allergies: Negative.   Psychiatric/Behavioral: Negative.     Blood pressure 103/49, pulse 129, temperature 100.7 F (38.2 C), temperature source Rectal, resp. rate 20, height 5\' 2"  (1.575 m), weight 51.256 kg (113 lb), SpO2 99.00%. Physical Exam  Constitutional: She is oriented to person, place, and time. She appears well-developed and well-nourished. No distress.  HENT:  Head: Normocephalic and atraumatic.  Right Ear: External ear normal.  Left Ear: External ear normal.  Nose: Nose normal.  Mouth/Throat: Oropharynx is clear and moist.  Eyes: Conjunctivae normal are normal. Pupils are equal, round, and reactive to light. Right eye exhibits no discharge. Left eye exhibits no discharge. No scleral icterus.  Neck: Normal range of motion. Neck supple.       Right IJ Catheter.  Cardiovascular: Regular rhythm.        Tachycardia.  Respiratory: Effort normal and breath sounds normal. No respiratory distress. She has no wheezes. She has no rales.  GI: Soft. Bowel sounds are normal. She exhibits no distension. There is no tenderness. There is no rebound.  Musculoskeletal: She exhibits no edema and no tenderness.  Neurological: She is alert and oriented to person, place, and time.       Moves all extremities.  Skin: Skin is warm and dry. She is not diaphoretic.     Assessment/Plan #1. Diabetic ketoacidosis - continue with IV fluids and IV insulin. Recheck metabolic panel and if anion gap is corrected then change to subcutaneous long-acting insulin. Not sure this time what precipitated the DKA could be from doubtful compliance with medication. Check hemoglobin A1c. #2. Addison's disease - continue with stress dose IV hydrocortisone for now until patient can reliably take orally. #3. Acute  encephalopathy - at this time is essentially resolved. Probably secondary to DKA and Addison's crisis and dehydration. Closely follow clinically. #4. Nausea and vomiting - probably precipitated by DKA. Abdomen appears benign. Denies abdominal pain. LFTs are pending. #5. Leukocytosis - probably stress related. Since patient was mildly febrile and initially hypotensive blood cultures were sent and empiric antibiotics were started. Chest x-ray and UA are pending at this time we will continue with antibiotics which can be discontinued if all lab sternotomy unremarkable. In addition procalcitonin and lactic acid were also ordered.  Patient's repeat metabolic panel, LFT, CBC with differential, TSH, lactic acid, urine pregnancy, chest x-ray, UA, urine drug screen, procalcitonin levels, influenza panel are all pending at this time.  CODE STATUS -full code.   Eduard Clos. 08/24/2012, 6:16 AM

## 2012-08-24 NOTE — Progress Notes (Signed)
[facility] PROGRESS NOTE  [name] DGU:[id number] DOB: Jan 18, 1981 DOA: 08/23/2012 PCP: Gweneth Dimitri, MD  Assessment/Plan DKA Patient started on glucose stabilizer protocol however her insulin drip was stopped prior to an and get being closed and was transferred to the floor. Patient is back into DKA with anion gap of 20 , hyponatremia and further worsening bicarbonate. Her glucose was stable between 150-200 however elevated up to 400s now. Patient denies further nausea or vomiting and is tolerating clear liquids however still fatigued. Blood pressure remained stable. Patient will be transferred to step down for closer monitoring. Her bicarbonate is as low as 8 with an anion gap of 24. Given her arterial pH greater than 7,  I would not start her on an IV bicarbonate at this time. -Continue on insulin drip with titration. Monitor CBG 1-2 hours and BMET every 2 hours until anion gap closed. Patient will be given 20 units of Lantus once anion gap closed and 2 hours before stopping the insulin drip. -Hemoglobin A1c is 14.5 -Appreciate diabetic coordinator's recommendations  Addison's disease Patient presented with low blood pressure and was given stress dose hydrocortisone. Her blood pressure remained stable at this time and I will discontinue the IV hydrocortisone and keep her on home dose of hydrocortisone.  Encephalopathy Likely in the setting of dehydration and DKA. Now resolved  Leukocytosis.  Likely stress-related and patient being on chronic hydrocortisone. Patient had temperature 100.7 on admission empirically started on IV vancomycin and aztreonam. . Chest x-ray and UA unremarkable. Follow blood culture. Influenza panel negative  Code Status: Full code Family Communication: Discussed with mother and father at bedside) Disposition Plan: Transfer to step down for closer monitoring. Home once stable   Consultants:  NONE, PCCM notified of patient being in  stepdown  Procedures:  none  Antibiotics: IV vancomycin signed HPI/Subjective: No nausea or vomiting. Patient is well alert and oriented and tolerating clears well   Objective: Filed Vitals:   08/24/12 0630 08/24/12 0700 08/24/12 0800 08/24/12 1429  BP: 99/56  98/69 103/58  Pulse: 104  93 107  Temp:   98 F (36.7 C) 98.1 F (36.7 C)  TempSrc:   Oral Oral  Resp: 14  16 16   Height:  5\' 2"  (1.575 m)    Weight:  47.809 kg (105 lb 6.4 oz)    SpO2: 96%  100% 100%    Intake/Output Summary (Last 24 hours) at 08/24/12 1718 Last data filed at 08/24/12 1449  Gross per 24 hour  Intake    240 ml  Output   1025 ml  Net   -785 ml   Filed Weights   08/24/12 0006 08/24/12 0700  Weight: 51.256 kg (113 lb) 47.809 kg (105 lb 6.4 oz)    Exam:   General: Middle aged thin built female in no acute distress  HEENT: No pallor, dry oral mucosa, right IJ line  Cardiovascular: Normal S1 and S2, no murmurs rub or gallop  Respiratory: Clear to auscultation bilaterally, no added sounds  Abdomen: Soft, nontender, nondistended, bowel sounds present  Extremities: Warm, no edema  CNS: AAO X3  Data Reviewed: Basic Metabolic Panel:  Lab 08/24/12 9563 08/24/12 1225 08/24/12 1010 08/24/12 0605 08/24/12 0255  NA 122* 127* 129* 133* 128*  K 4.5 5.9* 4.9 4.0 4.9  CL 90* 96 95* 100 88*  CO2 8* 11* 14* 18* 14*  GLUCOSE 444* 322* 175* 82 281*  BUN 19 19 19 20 20   CREATININE 0.61 0.60 0.60 0.71 0.80  CALCIUM  8.0* 8.3* 8.6 8.8 10.1  MG -- -- -- -- --  PHOS -- -- -- -- --   Liver Function Tests:  Lab 08/24/12 0605  AST 18  ALT 12  ALKPHOS 91  BILITOT 0.3  PROT 6.4  ALBUMIN 3.1*   No results found for this basename: LIPASE:5,AMYLASE:5 in the last 168 hours  Lab 08/24/12 0620  AMMONIA 24   CBC:  Lab 08/24/12 0605 08/24/12 0255  WBC 11.2* 16.9*  NEUTROABS 8.9* 13.3*  HGB 14.4 18.0*  HCT 39.0 48.2*  MCV 87.8 88.4  PLT 291 316   Cardiac Enzymes:  Lab 08/24/12 0605   CKTOTAL --  CKMB --  CKMBINDEX --  TROPONINI <0.30   BNP (last 3 results) No results found for this basename: PROBNP:3 in the last 8760 hours CBG:  Lab 08/24/12 1623 08/24/12 1005 08/24/12 0910 08/24/12 0816 08/24/12 0700  GLUCAP 326* 150* 110* 106* 103*    No results found for this or any previous visit (from the past 240 hour(s)).   Studies: Dg Chest Portable 1 View  08/24/2012  *RADIOLOGY REPORT*  Clinical Data: Nausea and vomiting; hyperglycemia.  PORTABLE CHEST - 1 VIEW  Comparison: Chest radiograph performed 01/16/2012  Findings: The lungs are well-aerated and clear.  There is no evidence of focal opacification, pleural effusion or pneumothorax.  The cardiomediastinal silhouette is within normal limits.  A right IJ line is noted ending about the mid SVC.  No acute osseous abnormalities are seen.  IMPRESSION: No acute cardiopulmonary process seen.   Original Report Authenticated By: Tonia Ghent, M.D.     Scheduled Meds:   . aztreonam  2 g Intravenous Q8H  . citalopram  20 mg Oral Daily  . enoxaparin (LOVENOX) injection  40 mg Subcutaneous Q24H  . hydrocortisone  10 mg Oral BID  . rOPINIRole  0.25 mg Oral QHS  . sodium chloride  1,000 mL Intravenous Once  . vancomycin  750 mg Intravenous Q8H   Continuous Infusions:   . sodium chloride    . dextrose 5 % and 0.45% NaCl 100 mL/hr at 08/24/12 1302  . insulin (NOVOLIN-R) infusion 1 Units/hr (08/24/12 1230)      Time spent: 35 minutes    [name]  [facility] Pager [phone]. If 8PM-8AM, please contact night-coverage at [email or web address], password [facility] 08/24/2012, 5:18 PM  LOS: 1 day

## 2012-08-24 NOTE — ED Provider Notes (Addendum)
History     CSN: [id number]  Arrival date & time 08/23/12  2351   First MD Initiated Contact with Patient 08/24/12 [phone]      Chief Complaint  Patient presents with  . Nausea  . Emesis  . Hyperglycemia    (Consider location/radiation/quality/duration/timing/severity/associated sxs/prior treatment) HPI  Past Medical History  Diagnosis Date  . Diabetes mellitus   . Addison disease   . Allergy   . Addison disease     Past Surgical History  Procedure Date  . Wisdom tooth extraction     Family History  Problem Relation Age of Onset  . Alzheimer's disease Maternal Grandmother   . Diabetes Paternal Grandmother   . Cancer Paternal Grandfather     History  Substance Use Topics  . Smoking status: Never Smoker   . Smokeless tobacco: Never Used  . Alcohol Use: 1.2 oz/week    2 Cans of beer per week     Comment: occasionally    OB History    Grav Para Term Preterm Abortions TAB SAB Ect Mult Living                  Review of Systems  Allergies  Augmentin  Home Medications   Current Outpatient Rx  Name  Route  Sig  Dispense  Refill  . CITALOPRAM HYDROBROMIDE 20 MG PO TABS   Oral   Take 1 tablet (20 mg total) by mouth daily.   30 tablet   5   . FLUDROCORTISONE ACETATE 0.1 MG PO TABS   Oral   Take 0.1 mg by mouth daily.         Marland Kitchen HYDROCORTISONE 10 MG PO TABS   Oral   Take 10 mg by mouth 2 (two) times daily. 10 mg every morning and 15 every evening         . INSULIN ASPART 100 UNIT/ML [place] SOLN   Subcutaneous   Inject 5 Units into the skin 3 (three) times daily with meals.   1 vial      . INSULIN GLARGINE 100 UNIT/ML [place] SOLN   Subcutaneous   Inject 20 Units into the skin daily.   10 mL      . NORETHINDRONE 0.35 MG PO TABS   Oral   Take 1 tablet (0.35 mg total) by mouth daily.   1 Package   11   . ROPINIROLE HCL 0.25 MG PO TABS   Oral   Take 0.25 mg by mouth at bedtime.          Marland Kitchen VALACYCLOVIR HCL 1 G PO TABS   Oral   Take 1 tablet  (1,000 mg total) by mouth 2 (two) times daily.   60 tablet   0     BP 103/49  Pulse 129  Temp 100.7 F (38.2 C) (Rectal)  Resp 20  Ht 5\' 2"  (1.575 m)  Wt 113 lb (51.256 kg)  BMI 20.67 kg/m2  SpO2 99%  Physical Exam  Nursing note and vitals reviewed.   ED Course  CENTRAL LINE Performed by: Tobin Chad Authorized by: Dana Allan T  CENTRAL LINE Date/Time: 08/24/2012 4:44 AM Performed by: Dana Allan T Authorized by: Dana Allan T Consent: Verbal consent not obtained. Written consent obtained. Risks and benefits: risks, benefits and alternatives were discussed Consent given by: parent (pt is altered.  unable to give consent for herself.) Patient understanding: patient states understanding of the procedure being performed Patient consent: the patient's understanding of the procedure matches  consent given Procedure consent: procedure consent matches procedure scheduled Relevant documents: relevant documents present and verified Test results: test results available and properly labeled Site marked: the operative site was marked Required items: required blood products, implants, devices, and special equipment available Patient identity confirmed: arm band and hospital-assigned identification number Time out: Immediately prior to procedure a "time out" was called to verify the correct patient, procedure, equipment, support staff and site/side marked as required. Indications: vascular access Anesthesia: local infiltration Local anesthetic: lidocaine 1% without epinephrine Anesthetic total: 2 ml Patient sedated: no Preparation: skin prepped with ChloraPrep Skin prep agent dried: skin prep agent completely dried prior to procedure Sterile barriers: all five maximum sterile barriers used - cap, mask, sterile gown, sterile gloves, and large sterile sheet Hand hygiene: hand hygiene performed prior to central venous catheter insertion Location details: right internal  jugular Site selection rationale: allowed for ultrasound guidance and is a site note prone to infection. Patient position: reverse Trendelenburg Catheter type: triple lumen Catheter size: 7.5 Fr Pre-procedure: landmarks identified Ultrasound guidance: yes Number of attempts: 1 Successful placement: yes Post-procedure: line sutured and dressing applied Assessment: blood return through all parts, free fluid flow, placement verified by x-ray and no pneumothorax on x-ray Patient tolerance: Patient tolerated the procedure well with no immediate complications. Comments: Pt became nauseated and vomited during the procedure.  The sterile field was not violated.  Nursing was able to administer zofran and she demonstrated no evidence of airway compromise.   (including critical care time)  Labs Reviewed  GLUCOSE, CAPILLARY - Abnormal; Notable for the following:    Glucose-Capillary 495 (*)     All other components within normal limits  CBC WITH DIFFERENTIAL - Abnormal; Notable for the following:    WBC 16.9 (*)     RBC 5.45 (*)     Hemoglobin 18.0 (*)     HCT 48.2 (*)     MCHC 37.3 (*)     Neutrophils Relative 79 (*)     Neutro Abs 13.3 (*)     Monocytes Absolute 1.2 (*)     All other components within normal limits  BASIC METABOLIC PANEL - Abnormal; Notable for the following:    Sodium 128 (*)     Chloride 88 (*)     CO2 14 (*)     Glucose, Bld 281 (*)     All other components within normal limits  URINALYSIS, ROUTINE W REFLEX MICROSCOPIC  BLOOD GAS, VENOUS  BLOOD GAS, ARTERIAL   No results found.   No diagnosis found.    MDM  Pt is being followed by PA. Manus Rudd under my direct supervision.  Reviewed available previous medical records.  Performed a bedside evaluation.  Pt appears acutely ill.  She appears thin and pale with dry mucous membranes.  Note tachycardia and hypotension.  She is also altered and somnolent.  She is oriented to person and surroundings but is unable to  provide any history as to her current condition or symptoms.  Her mother only knows that her daughter called her and stated that she was ill.  Her parents do not live locally.  Discussed her evaluation at length with her mother.  Calculated anion gap to be greater than 30.  After multiple unsuccessful attempts to gain peripheral access, decided to place a central line.  This would allow for the administration of IVF, insulin gtt, and any other necessary medications.  Pt has a hx of DM type I and Addison's disease.  Unsure what might have precipitated this crisis.  She has an elevated rectal temperature.  This may be secondary to over bundling however it may also indicate an underlying infectious process.  Will obtain blood cultures and administer broad abx.  Debbora Presto has an allergy to augmentin - ordered vancomycin and aztreonam.  Pt was administered stress dose hydrocortisone.  Will contact the hospitalist for further evaluation and admission to the ICU.   0515.  Discussed her evaluation with the on-call hospitalist.  He will evaluate her an decide if she will be admitted to the ICU or step-down unit.  CRITICAL CARE Performed by: Dana Allan T   Total critical care time: 45  Critical care time was exclusive of separately billable procedures and treating other patients.  Critical care was necessary to treat or prevent imminent or life-threatening deterioration.  Critical care was time spent personally by me on the following activities: development of treatment plan with patient and/or surrogate as well as nursing, discussions with consultants, evaluation of patient's response to treatment, examination of patient, obtaining history from patient or surrogate, ordering and performing treatments and interventions, ordering and review of laboratory studies, ordering and review of radiographic studies, pulse oximetry and re-evaluation of patient's condition.         Tobin Chad, MD 08/24/12  [id number]  Tobin Chad, MD 08/24/12 [phone]

## 2012-08-24 NOTE — ED Notes (Signed)
MD at bedside. 

## 2012-08-24 NOTE — ED Provider Notes (Signed)
History     CSN: [id number]  Arrival date & time 08/23/12  2351   First MD Initiated Contact with Patient 08/24/12 [phone]      Chief Complaint  Patient presents with  . Nausea  . Emesis  . Hyperglycemia    (Consider location/radiation/quality/duration/timing/severity/associated sxs/prior treatment) HPI Comments: Patient type I diabetic with elevated BS today did not take her Lantuss tonight has been vomiting al day.  Denied fever, UTI symptoms, cough, URI symptoms . Was last hospitalized for DKA 02/15/2012   Patient is a 32 y.o. female presenting with vomiting. The history is provided by the patient.  Emesis  This is a new problem. The current episode started 12 to 24 hours ago. The problem has not changed since onset.There has been no fever. Associated symptoms include headaches. Pertinent negatives include no abdominal pain, no chills, no diarrhea, no fever and no URI.    Past Medical History  Diagnosis Date  . Diabetes mellitus   . Addison disease   . Allergy   . Addison disease     Past Surgical History  Procedure Date  . Wisdom tooth extraction     Family History  Problem Relation Age of Onset  . Alzheimer's disease Maternal Grandmother   . Diabetes Paternal Grandmother   . Cancer Paternal Grandfather     History  Substance Use Topics  . Smoking status: Never Smoker   . Smokeless tobacco: Never Used  . Alcohol Use: 1.2 oz/week    2 Cans of beer per week     Comment: occasionally    OB History    Grav Para Term Preterm Abortions TAB SAB Ect Mult Living                  Review of Systems  Constitutional: Negative for fever and chills.  HENT: Negative for congestion.   Eyes: Negative.   Respiratory: Negative for shortness of breath.   Cardiovascular: Positive for chest pain.  Gastrointestinal: Positive for nausea and vomiting. Negative for abdominal pain and diarrhea.  Genitourinary: Negative for dysuria and vaginal discharge.  Skin: Negative for  rash and wound.  Neurological: Positive for weakness and headaches. Negative for dizziness.    Allergies  Augmentin  Home Medications   Current Outpatient Rx  Name  Route  Sig  Dispense  Refill  . CITALOPRAM HYDROBROMIDE 20 MG PO TABS   Oral   Take 1 tablet (20 mg total) by mouth daily.   30 tablet   5   . FLUDROCORTISONE ACETATE 0.1 MG PO TABS   Oral   Take 0.1 mg by mouth daily.         Marland Kitchen HYDROCORTISONE 10 MG PO TABS   Oral   Take 10 mg by mouth 2 (two) times daily. 10 mg every morning and 15 every evening         . INSULIN ASPART 100 UNIT/ML [place] SOLN   Subcutaneous   Inject 5 Units into the skin 3 (three) times daily with meals.   1 vial      . INSULIN GLARGINE 100 UNIT/ML [place] SOLN   Subcutaneous   Inject 20 Units into the skin daily.   10 mL      . NORETHINDRONE 0.35 MG PO TABS   Oral   Take 1 tablet (0.35 mg total) by mouth daily.   1 Package   11   . ROPINIROLE HCL 0.25 MG PO TABS   Oral   Take 0.25 mg by mouth  at bedtime.          Marland Kitchen VALACYCLOVIR HCL 1 G PO TABS   Oral   Take 1 tablet (1,000 mg total) by mouth 2 (two) times daily.   60 tablet   0     BP 103/49  Pulse 129  Temp 100.7 F (38.2 C) (Rectal)  Resp 20  Ht 5\' 2"  (1.575 m)  Wt 113 lb (51.256 kg)  BMI 20.67 kg/m2  SpO2 99%  Physical Exam  Constitutional: She is oriented to person, place, and time. She appears well-developed and well-nourished.  HENT:  Head: Normocephalic.  Mouth/Throat: Mucous membranes are dry.  Eyes: Pupils are equal, round, and reactive to light.  Neck: Normal range of motion.  Cardiovascular: Normal rate.   Pulmonary/Chest: Effort normal.  Abdominal: Soft. Bowel sounds are normal. She exhibits no distension. There is no tenderness.  Musculoskeletal: Normal range of motion.  Neurological: She is alert and oriented to person, place, and time.  Skin: Skin is warm and dry. There is pallor.    ED Course  Procedures (including critical care time)  Labs  Reviewed  GLUCOSE, CAPILLARY - Abnormal; Notable for the following:    Glucose-Capillary 495 (*)     All other components within normal limits  CBC WITH DIFFERENTIAL - Abnormal; Notable for the following:    WBC 16.9 (*)     RBC 5.45 (*)     Hemoglobin 18.0 (*)     HCT 48.2 (*)     MCHC 37.3 (*)     Neutrophils Relative 79 (*)     Neutro Abs 13.3 (*)     Monocytes Absolute 1.2 (*)     All other components within normal limits  BASIC METABOLIC PANEL - Abnormal; Notable for the following:    Sodium 128 (*)     Chloride 88 (*)     CO2 14 (*)     Glucose, Bld 281 (*)     All other components within normal limits  BLOOD GAS, VENOUS - Abnormal; Notable for the following:    pH, Ven 7.317 (*)     pCO2, Ven 31.0 (*)     pO2, Ven 153.0 (*)     Bicarbonate 15.1 (*)     Acid-base deficit 9.0 (*)     All other components within normal limits  GLUCOSE, CAPILLARY - Abnormal; Notable for the following:    Glucose-Capillary 139 (*)     All other components within normal limits  URINALYSIS, ROUTINE W REFLEX MICROSCOPIC  CULTURE, BLOOD (ROUTINE X 2)  CULTURE, BLOOD (ROUTINE X 2)  URINE CULTURE  BASIC METABOLIC PANEL  KETONES, QUALITATIVE  TROPONIN I  AMMONIA  HEPATIC FUNCTION PANEL  CBC WITH DIFFERENTIAL  TSH  LACTIC ACID, PLASMA  PROCALCITONIN  INFLUENZA PANEL BY PCR   No results found. ED ECG REPORT   Date: 08/24/2012  EKG Time: 5:36 AM  Rate: 123  Rhythm: sinus tachycardia,  unchanged from previous tracings  Axis: normal  Intervals:none  ST&T Change: none  Narrative Interpretation: normal sinus tach             1. DKA (diabetic ketoacidoses)   2. Addison disease   3. Hyponatremia   4. Hypovolemic shock   5. Dehydration   6. Fever   7. Altered mental status       MDM          Arman Filter, NP 08/24/12 0529  Arman Filter, NP 08/24/12 [phone]

## 2012-08-24 NOTE — ED Notes (Signed)
Spoke to [place], Charity fundraiser with IV team, she feels that she will be unable to obtain access because we were unable to located anything with ultrasound. She suggests a central line or EJ/IJ at this time.

## 2012-08-24 NOTE — Progress Notes (Signed)
Inpatient Diabetes Program Recommendations  AACE/ADA: New Consensus Statement on Inpatient Glycemic Control (2013)  Target Ranges:  Prepandial:   less than 140 mg/dL      Peak postprandial:   less than 180 mg/dL (1-2 hours)      Critically ill patients:  140 - 180 mg/dL   Reason for Visit: DKA on admission  Inpatient Diabetes Program Recommendations HgbA1C: Please order. Last value on 08/31/11 ws 14.5% Please order 20 units lantus once gap has closed and CO2 at normal value 2 hrs before the IV insulin drip is discontinued.  Note: No correction coverage at home listed. Would suggest at least a sensitive correction scale tidwc and HS scale at discharge to home. Thank you, Lenor Coffin, RN, CNS, Diabetes Coordinator [phone])

## 2012-08-25 LAB — BASIC METABOLIC PANEL
BUN: 9 mg/dL (ref 6–23)
BUN: 9 mg/dL (ref 6–23)
BUN: 11 mg/dL (ref 6–23)
BUN: 10 mg/dL (ref 6–23)
CO2: 14 mEq/L — ABNORMAL LOW (ref 19–32)
CO2: 15 mEq/L — ABNORMAL LOW (ref 19–32)
CO2: 19 mEq/L (ref 19–32)
Calcium: 7.9 mg/dL — ABNORMAL LOW (ref 8.4–10.5)
Calcium: 7.6 mg/dL — ABNORMAL LOW (ref 8.4–10.5)
Calcium: 7.7 mg/dL — ABNORMAL LOW (ref 8.4–10.5)
Chloride: 100 mEq/L (ref 96–112)
Chloride: 102 mEq/L (ref 96–112)
Creatinine, Ser: 0.48 mg/dL — ABNORMAL LOW (ref 0.50–1.10)
Creatinine, Ser: 0.49 mg/dL — ABNORMAL LOW (ref 0.50–1.10)
Creatinine, Ser: 0.55 mg/dL (ref 0.50–1.10)
Creatinine, Ser: 0.67 mg/dL (ref 0.50–1.10)
GFR calc Af Amer: >90 mL/min (ref >90)
GFR calc Af Amer: >90 mL/min (ref >90)
GFR calc Af Amer: >90 mL/min (ref >90)
GFR calc Af Amer: >90 mL/min (ref >90)
GFR calc non Af Amer: >90 mL/min (ref >90)
GFR calc non Af Amer: >90 mL/min (ref >90)
GFR calc non Af Amer: >90 mL/min (ref >90)
GFR calc non Af Amer: >90 mL/min (ref >90)
Glucose, Bld: 236 mg/dL — ABNORMAL HIGH (ref 70–99)
Glucose, Bld: 202 mg/dL — ABNORMAL HIGH (ref 70–99)
Glucose, Bld: 309 mg/dL — ABNORMAL HIGH (ref 70–99)
Glucose, Bld: 310 mg/dL — ABNORMAL HIGH (ref 70–99)
Potassium: 4.5 mEq/L (ref 3.5–5.1)
Potassium: 3.8 mEq/L (ref 3.5–5.1)
Potassium: 3.7 mEq/L (ref 3.5–5.1)
Potassium: 3.3 mEq/L — ABNORMAL LOW (ref 3.5–5.1)
Sodium: 129 mEq/L — ABNORMAL LOW (ref 135–145)
Sodium: 130 mEq/L — ABNORMAL LOW (ref 135–145)
Sodium: 132 mEq/L — ABNORMAL LOW (ref 135–145)
Sodium: 134 mEq/L — ABNORMAL LOW (ref 135–145)

## 2012-08-25 LAB — GLUCOSE, CAPILLARY
Glucose-Capillary: 133 mg/dL — ABNORMAL HIGH (ref 70–99)
Glucose-Capillary: 215 mg/dL — ABNORMAL HIGH (ref 70–99)
Glucose-Capillary: 216 mg/dL — ABNORMAL HIGH (ref 70–99)
Glucose-Capillary: 149 mg/dL — ABNORMAL HIGH (ref 70–99)
Glucose-Capillary: 283 mg/dL — ABNORMAL HIGH (ref 70–99)
Glucose-Capillary: 282 mg/dL — ABNORMAL HIGH (ref 70–99)
Glucose-Capillary: 278 mg/dL — ABNORMAL HIGH (ref 70–99)
Glucose-Capillary: 257 mg/dL — ABNORMAL HIGH (ref 70–99)
Glucose-Capillary: 213 mg/dL — ABNORMAL HIGH (ref 70–99)
Glucose-Capillary: 264 mg/dL — ABNORMAL HIGH (ref 70–99)

## 2012-08-25 LAB — URINE CULTURE

## 2012-08-25 MED ORDER — INSULIN ASPART 100 UNIT/ML ~~LOC~~ SOLN
0.0000 [IU] | Freq: Three times a day (TID) | SUBCUTANEOUS | Status: DC
  Administered 2012-08-25 (×2): 1 [IU] via SUBCUTANEOUS
  Administered 2012-08-25: 5 [IU] via SUBCUTANEOUS
  Administered 2012-08-26: 3 [IU] via SUBCUTANEOUS
  Administered 2012-08-26: 2 [IU] via SUBCUTANEOUS

## 2012-08-25 NOTE — Progress Notes (Addendum)
Inpatient Diabetes Program Recommendations  AACE/ADA: New Consensus Statement on Inpatient Glycemic Control (2013)  Target Ranges:  Prepandial:   less than 140 mg/dL      Peak postprandial:   less than 180 mg/dL (1-2 hours)      Critically ill patients:  140 - 180 mg/dL   Reason for Visit: Glucose elevated to 309 mg/dL (lab glucose) at 1610 pm today, CO2 at 13, and GAP at 17, these values up from the results earlier this am at 0630. Pt may need to resume IV insulin if not resolved by next B-met ?, hopefully at 1600 today.   Once off IV insulin, pt needs meal coverage as well as correction scale.  Recommend 4-6 units tidwc.  Will talk with/counsel patient about monitoring and giving correction and meal coverage.  Thank you, Lenor Coffin, RN, CNS, Diabetes Coordinator [phone])

## 2012-08-25 NOTE — Progress Notes (Signed)
ANTIBIOTIC CONSULT NOTE - INITIAL  Pharmacy Consult for Vancomycin and Azactam Indication: rule out sepsis  Allergies  Allergen Reactions  . Augmentin (Amoxicillin-Pot Clavulanate) Diarrhea    Patient Measurements: Height: 5\' 2"  (157.5 cm) Weight: 110 lb 7.2 oz (50.1 kg) IBW/kg (Calculated) : 50.1    Vital Signs: Temp: 97.9 F (36.6 C) (01/21 0400) Temp src: Oral (01/21 0400) BP: 101/52 mmHg (01/21 0500) Pulse Rate: 94  (01/21 0500) Intake/Output from previous day: 01/20 0701 - 01/21 0700 In: 2977.2 [P.O.:240; I.V.:2137.2; IV Piggyback:600] Out: 2300 [Urine:2300] Intake/Output from this shift: Total I/O In: 1378 [I.V.:1178; IV Piggyback:200] Out: 800 [Urine:800]  Labs:  [facility] 08/25/12 0415 08/24/12 2046 08/24/12 1420 08/24/12 0605 08/24/12 0255  WBC -- -- -- 11.2* 16.9*  HGB -- -- -- 14.4 18.0*  PLT -- -- -- 291 316  LABCREA -- -- -- -- --  CREATININE 0.48* 0.54 0.61 -- --   Estimated Creatinine Clearance: 79.1 ml/min (by C-G formula based on Cr of 0.48).  Basename 08/25/12 0415  VANCOTROUGH 18.3  VANCOPEAK --  VANCORANDOM --  GENTTROUGH --  GENTPEAK --  GENTRANDOM --  TOBRATROUGH --  TOBRAPEAK --  TOBRARND --  AMIKACINPEAK --  AMIKACINTROU --  AMIKACIN --     Microbiology: Recent Results (from the past 720 hour(s))  MRSA PCR SCREENING     Status: Normal   Collection Time   08/24/12  7:20 PM      Component Value Range Status Comment   MRSA by PCR NEGATIVE  NEGATIVE Final     Medical History: Past Medical History  Diagnosis Date  . Diabetes mellitus   . Addison disease   . Allergy   . Addison disease     Medications:  Scheduled:     . aztreonam  2 g Intravenous Q8H  . citalopram  20 mg Oral Daily  . enoxaparin (LOVENOX) injection  40 mg Subcutaneous Q24H  . hydrocortisone  10 mg Oral BID  . insulin aspart  0-9 Units Subcutaneous TID WC  . [COMPLETED] insulin glargine  20 Units Subcutaneous Once  . [EXPIRED] potassium chloride  10  mEq Intravenous Q1H  . rOPINIRole  0.25 mg Oral QHS  . [COMPLETED] sodium chloride  1,000 mL Intravenous Once  . [COMPLETED] sodium chloride  1,000 mL Intravenous Once  . vancomycin  750 mg Intravenous Q8H  . [DISCONTINUED] enoxaparin (LOVENOX) injection  40 mg Subcutaneous Q24H  . [DISCONTINUED] hydrocortisone sod succinate (SOLU-CORTEF) injection  50 mg Intravenous Q8H  . [DISCONTINUED] insulin glargine  12 Units Subcutaneous Once  . [DISCONTINUED] insulin regular  0-10 Units Intravenous TID WC  . [DISCONTINUED] ondansetron  4 mg Intravenous Once  . [DISCONTINUED] potassium chloride  10 mEq Intravenous Q1H   Infusions:     . sodium chloride 150 mL/hr at 08/25/12 0315  . dextrose 5 % and 0.45% NaCl Stopped (08/25/12 0315)  . insulin (NOVOLIN-R) infusion Stopped (08/25/12 0210)  . [DISCONTINUED] sodium chloride    . [DISCONTINUED] sodium chloride 125 mL/hr (08/24/12 0815)  . [DISCONTINUED] sodium chloride    . [DISCONTINUED] sodium chloride    . [DISCONTINUED] dextrose 5 % and 0.45% NaCl    . [DISCONTINUED] dextrose 5 % and 0.9% NaCl 100 mL/hr at 08/24/12 0623  . [DISCONTINUED] insulin (NOVOLIN-R) infusion Stopped (08/24/12 0703)  . [DISCONTINUED] insulin (NOVOLIN-R) infusion     Assessment: 32 yo pt with history of DM and Addison's disease admitted with probable Sepsis.  MD ordering Vancomycin and Azactam per RX.  SCr stable.  VT=18.3.  Goal of Therapy:  Vancomycin trough level 15-20 mcg/ml  Plan:   Continue Azactam 2Gm IV q8h  Continue Vancomycin 750mg  IV q8h.  CrCl~114 (N)  F/U SCr/levels as needed.  Susanne Greenhouse R 08/25/2012,5:34 AM

## 2012-08-25 NOTE — Progress Notes (Signed)
[facility] PROGRESS NOTE  [name] ZOX:[id number] DOB: 1980-09-15 DOA: 08/23/2012 PCP: sees Dr Armen Pickup patel at [facility] for DM  Assessment/Plan:   DKA  Patient started on glucose stabilizer protocol however her insulin drip was stopped prior to AG  being closed and was transferred to the floor. Patient was back into DKA with anion gap of 20 , hyponatremia and further worsening bicarbonate as low as 8 and fsg increased to 400. -transferred to stepdown on insulin drip and IV NS . Gap closed overnight and given 20 units lantus  . Electrolytes stable this am. Last fsg of 149.  - no further nausea or vomiting and wishes to advance diet.  -had a long discussion with patient , she is tearful as she has to live with the illness and has issue with being compliant to frequent fsg monitoring and taking her Humalog. Also feels she does not have much family and social support. She does take her lantus daily but fails take her humalog due to not monitoring her fsg. Her fsg have been in the range of 200s to 300s mostly for past few months. Her Hba1c IS 14.5 -i spoke with her endocrinologist Dr Allena Katz who informs patient to be on 19 units lantus daily and on 1 unit of humalog per 20 gm of CHO and is aware about poor compliance. He has scheduled her to be trained for continuous glucose monitoring with the diabetic coordinator ion march ad now plans to reschedule in earlier. He recommends to discharge patient at her home dose of insulin and he will follow her up within next few weeks.  -patient counseled on the importance of strict glucose monitoring , diet and compliance of insulin use to prevent hyperglycemic episodes and more importantly avoiding systemic complications.    -Hemoglobin A1c is 14.5  -Appreciate diabetic coordinator's recommendations   Addison's disease  Patient presented with low blood pressure and was given stress dose hydrocortisone. Her blood pressure remained stable. D/c  IV hydrocortisone and resumed home dose.  Encephalopathy  Likely in the setting of dehydration and DKA. Now resolved   Leukocytosis.  Likely stress-related and patient being on chronic hydrocortisone.  Patient had temperature 100.7 on admission empirically started on IV vancomycin and aztreonam. . Chest x-ray and UA unremarkable. Follow blood culture. Influenza panel negative  Will d/c abx if blood cx negative     Code Status: full Family Communication: none at bedside Disposition Plan: home tomorrow if stable   Consultants:  none  Procedures:  none  Antibiotics:  IV anco and aztreonam ( day 2)  HPI/Subjective: transferred to stepdown for close monitoring of glucose given persistent DKA and AG.Gap finally closed ad patient off insulin drip.     Objective: Filed Vitals:   08/25/12 0100 08/25/12 0200 08/25/12 0400 08/25/12 0500  BP: 93/61 89/56 95/56  101/52  Pulse:    94  Temp:   97.9 F (36.6 C)   TempSrc:   Oral   Resp: 15 10 12 16   Height:      Weight:      SpO2:    100%    Intake/Output Summary (Last 24 hours) at 08/25/12 0830 Last data filed at 08/25/12 0600  Gross per 24 hour  Intake 3627.19 ml  Output   2300 ml  Net 1327.19 ml   Filed Weights   08/24/12 0006 08/24/12 0700 08/24/12 2000  Weight: 51.256 kg (113 lb) 47.809 kg (105 lb 6.4 oz) 50.1 kg (110 lb 7.2 oz)  Exam:  General: Middle aged thin built female in no acute distress  HEENT: No pallor, dry oral mucosa, right IJ line  Cardiovascular: Normal S1 and S2, no murmurs rub or gallop  Respiratory: Clear to auscultation bilaterally, no added sounds  Abdomen: Soft, nontender, nondistended, bowel sounds present  Extremities: Warm, no edema  CNS: AAO X3   Data Reviewed: Basic Metabolic Panel:  Lab 08/25/12 1610 08/25/12 0415 08/24/12 2046 08/24/12 1420 08/24/12 1225  NA 130* 129* 125* 122* 127*  K 3.8 4.5 4.0 4.5 5.9*  CL 102 100 99 90* 96  CO2 15* 14* 17* 8* 11*  GLUCOSE 202* 236*  146* 444* 322*  BUN 9 9 14 19 19   CREATININE 0.49* 0.48* 0.54 0.61 0.60  CALCIUM 7.6* 7.9* 7.8* 8.0* 8.3*  MG -- -- -- -- --  PHOS -- -- -- -- --   Liver Function Tests:  Lab 08/24/12 0605  AST 18  ALT 12  ALKPHOS 91  BILITOT 0.3  PROT 6.4  ALBUMIN 3.1*   No results found for this basename: LIPASE:5,AMYLASE:5 in the last 168 hours  Lab 08/24/12 0620  AMMONIA 24   CBC:  Lab 08/24/12 0605 08/24/12 0255  WBC 11.2* 16.9*  NEUTROABS 8.9* 13.3*  HGB 14.4 18.0*  HCT 39.0 48.2*  MCV 87.8 88.4  PLT 291 316   Cardiac Enzymes:  Lab 08/24/12 0605  CKTOTAL --  CKMB --  CKMBINDEX --  TROPONINI <0.30   BNP (last 3 results) No results found for this basename: PROBNP:3 in the last 8760 hours CBG:  Lab 08/25/12 0542 08/25/12 0359 08/25/12 0121 08/25/12 0016 08/24/12 2308  GLUCAP 216* 215* 159* 133* 157*    Recent Results (from the past 240 hour(s))  URINE CULTURE     Status: Normal   Collection Time   08/24/12  5:45 AM      Component Value Range Status Comment   Specimen Description URINE, CATHETERIZED   Final    Special Requests NONE   Final    Culture  Setup Time 08/24/2012 11:48   Final    Colony Count NO GROWTH   Final    Culture NO GROWTH   Final    Report Status 08/25/2012 FINAL   Final   MRSA PCR SCREENING     Status: Normal   Collection Time   08/24/12  7:20 PM      Component Value Range Status Comment   MRSA by PCR NEGATIVE  NEGATIVE Final      Studies: Dg Chest Portable 1 View  08/24/2012  *RADIOLOGY REPORT*  Clinical Data: Nausea and vomiting; hyperglycemia.  PORTABLE CHEST - 1 VIEW  Comparison: Chest radiograph performed 01/16/2012  Findings: The lungs are well-aerated and clear.  There is no evidence of focal opacification, pleural effusion or pneumothorax.  The cardiomediastinal silhouette is within normal limits.  A right IJ line is noted ending about the mid SVC.  No acute osseous abnormalities are seen.  IMPRESSION: No acute cardiopulmonary process  seen.   Original Report Authenticated By: Tonia Ghent, M.D.     Scheduled Meds:   . aztreonam  2 g Intravenous Q8H  . citalopram  20 mg Oral Daily  . enoxaparin (LOVENOX) injection  40 mg Subcutaneous Q24H  . hydrocortisone  10 mg Oral BID  . insulin aspart  0-9 Units Subcutaneous TID WC  . rOPINIRole  0.25 mg Oral QHS  . vancomycin  750 mg Intravenous Q8H   Continuous Infusions:   . sodium  chloride 150 mL/hr at 08/25/12 0315  . dextrose 5 % and 0.45% NaCl Stopped (08/25/12 0315)  . insulin (NOVOLIN-R) infusion Stopped (08/25/12 0210)      Time spent: 35 minutes    Eddie North  [facility] Pager [phone] If 8PM-8AM, please contact night-coverage at [email or web address], password [facility] 08/25/2012, 8:30 AM  LOS: 2 days

## 2012-08-25 NOTE — Progress Notes (Signed)
Nutrition Brief Note   RD drawn to chart for MST.  Pt with wt change related to poor glucose control.   Lab Results  Component Value Date   HGBA1C 14.2* 08/24/2012    RD met with pt to discuss glucose management.  Pt lists several hindrances to adequate control- not checking blood sugar regularly, decreased privacy/scheduling difficulties at work, poor eating habit, opting not to check CHOs when eating.  RD listed several resources available including handout to which pt states "intellectually, I know what to do, I just need practical advise." RD spent time discussing current barriers and ways to navigate.  Pt reiterates feelings previously shared with other providers of difficulty accepting that she has a disease which requires management.  Pt states she was dx as a teen and had difficulty finding balance between being responsible for self-care and wanting independence. Teach back method used. Pt states she is very interested in support groups or diabetes-related seminars.  She is particularly interested in a support group for Type 1 DMs only.  Will contact DM coordinator.  Expect good compliance.  Body mass index is 20.20 kg/(m^2). Pt meets criteria for wnl based on current BMI.  Current diet order is CHO Mod Med. Labs and medications reviewed. No further nutrition interventions warranted at this time. Pt being followed by Diabetes coordinator for insulin management.  RD contact information provided. If additional nutrition issues arise, please re-consult RD.  Loyce Dys, MS RD LDN Clinical Inpatient Dietitian Pager: [phone] Weekend/After hours pager: [phone]

## 2012-08-26 DIAGNOSIS — R4182 Altered mental status, unspecified: Secondary | ICD-10-CM

## 2012-08-26 LAB — BASIC METABOLIC PANEL
BUN: 11 mg/dL (ref 6–23)
BUN: 11 mg/dL (ref 6–23)
CO2: 18 mEq/L — ABNORMAL LOW (ref 19–32)
CO2: 19 mEq/L (ref 19–32)
Calcium: 7.6 mg/dL — ABNORMAL LOW (ref 8.4–10.5)
Calcium: 7.7 mg/dL — ABNORMAL LOW (ref 8.4–10.5)
Calcium: 7.8 mg/dL — ABNORMAL LOW (ref 8.4–10.5)
Chloride: 107 mEq/L (ref 96–112)
Chloride: 105 mEq/L (ref 96–112)
Chloride: 103 mEq/L (ref 96–112)
Creatinine, Ser: 0.54 mg/dL (ref 0.50–1.10)
Creatinine, Ser: 0.51 mg/dL (ref 0.50–1.10)
Creatinine, Ser: 0.52 mg/dL (ref 0.50–1.10)
GFR calc Af Amer: >90 mL/min (ref >90)
GFR calc Af Amer: >90 mL/min (ref >90)
GFR calc Af Amer: >90 mL/min (ref >90)
GFR calc Af Amer: >90 mL/min (ref >90)
GFR calc non Af Amer: >90 mL/min (ref >90)
GFR calc non Af Amer: >90 mL/min (ref >90)
GFR calc non Af Amer: >90 mL/min (ref >90)
Glucose, Bld: 221 mg/dL — ABNORMAL HIGH (ref 70–99)
Glucose, Bld: 325 mg/dL — ABNORMAL HIGH (ref 70–99)
Potassium: 3.1 mEq/L — ABNORMAL LOW (ref 3.5–5.1)
Potassium: 3.1 mEq/L — ABNORMAL LOW (ref 3.5–5.1)
Potassium: 3.2 mEq/L — ABNORMAL LOW (ref 3.5–5.1)
Potassium: 3.8 mEq/L (ref 3.5–5.1)
Sodium: 133 mEq/L — ABNORMAL LOW (ref 135–145)
Sodium: 133 mEq/L — ABNORMAL LOW (ref 135–145)
Sodium: 135 mEq/L (ref 135–145)
Sodium: 136 mEq/L (ref 135–145)

## 2012-08-26 LAB — GLUCOSE, CAPILLARY
Glucose-Capillary: 85 mg/dL (ref 70–99)
Glucose-Capillary: 139 mg/dL — ABNORMAL HIGH (ref 70–99)
Glucose-Capillary: 152 mg/dL — ABNORMAL HIGH (ref 70–99)

## 2012-08-26 LAB — PROCALCITONIN: Procalcitonin: 0.12 ng/mL

## 2012-08-26 MED ORDER — UNABLE TO FIND: Status: DC

## 2012-08-26 MED ORDER — INSULIN ASPART 100 UNIT/ML ~~LOC~~ SOLN
5.0000 [IU] | Freq: Three times a day (TID) | SUBCUTANEOUS | Status: DC
  Administered 2012-08-26 (×2): 5 [IU] via SUBCUTANEOUS

## 2012-08-26 MED ORDER — POTASSIUM CHLORIDE 20 MEQ/15ML (10%) PO LIQD
40.0000 meq | Freq: Once | ORAL | Status: AC
  Administered 2012-08-26: 40 meq via ORAL
  Filled 2012-08-26: qty 30

## 2012-08-26 MED ORDER — INSULIN GLARGINE 100 UNIT/ML ~~LOC~~ SOLN
20.0000 [IU] | Freq: Every day | SUBCUTANEOUS | Status: DC
  Administered 2012-08-26: 20 [IU] via SUBCUTANEOUS

## 2012-08-26 NOTE — Discharge Summary (Signed)
Physician Discharge Summary  [name] AVW:[id number] DOB: 1981/06/19 DOA: 08/23/2012  PCP: Gweneth Dimitri, MD  Admit date: 08/23/2012 Discharge date: 08/26/2012  Recommendations for Outpatient Follow-up:  1. Pt will need to follow up with PCP in 2 weeks post discharge 2. Please obtain BMP to evaluate electrolytes and kidney function 3. Follow up with  Endocrinologist, Dr. Allena Katz in 2 weeks  Discharge Diagnoses:  Principal Problem:  *DKA (diabetic ketoacidoses) Active Problems:  Nausea & vomiting  Addison disease  Encephalopathy acute DKA  Patient started on glucose stabilizer protocol at time of admissino;however her insulin drip was stopped prior to AG being closed and was transferred to the floor. Patient was back into DKA with anion gap of 20 , hyponatremia and further worsening bicarbonate as low as 8 and fsg increased to 400, and  She had to be transferred back to stepdown on 08/24/12 -transferred to stepdown on insulin drip restarted  and IV NS . Gap closed last night. on the morning of discharge, the patient's insulin drip was discontinued. The patient was started on  20 units lantus as well as NovoLog 5 units pre-meal . Electrolytes stable this am. Anion gap remains closed on subcutaneous insulin. The patient had some mild pseudohyponatremia due to elevated CBG. - no further nausea or vomiting and wishes to advance diet. The patient tolerated a carbohydrate modified diet without difficulty. -had a long discussion with patient , she is tearful as she has to live with the illness and has issue with being compliant to frequent fsg monitoring and taking her Humalog. Also feels she does not have much family and social support. She does take her lantus daily but fails take her humalog due to not monitoring her fsg. Her fsg have been in the range of 200s to 300s mostly for past few months. Her Hba1c  14.5  -i spoke with her endocrinologist Dr Allena Katz who informs patient to be on 20 units lantus  daily and on 1 unit of humalog per 20 gm of CHO and is aware about poor compliance. He has scheduled her to be trained for continuous glucose monitoring with the diabetic coordinator ion march ad now plans to reschedule in earlier. He recommends to discharge patient at her home dose of insulin and he will follow her up within next few weeks.  -patient counseled on the importance of strict glucose monitoring , diet and compliance of insulin use to prevent hyperglycemic episodes and more importantly avoiding systemic complications.  -Hemoglobin A1c is 14.5  -Appreciate diabetic coordinator's recommendations  Addison's disease  Patient presented with low blood pressure and was given stress dose hydrocortisone. Her blood pressure remained stable. D/c IV hydrocortisone and resumed home dose.--Blood pressure remained stable on oral steroids Encephalopathy  Likely in the setting of dehydration and DKA. Now resolved  Leukocytosis.  Likely stress-related and patient being on chronic hydrocortisone.  Patient had temperature 100.7 on admission empirically started on IV vancomycin and aztreonam. . Chest x-ray and UA unremarkable. blood culture negative. Influenza panel negative--antibiotics discontinued    Discharge Condition: stable  Disposition: stable  Diet: carbohydrate modified Wt Readings from Last 3 Encounters:  08/26/12 55 kg (121 lb 4.1 oz)  06/15/12 50.077 kg (110 lb 6.4 oz)  02/13/12 47.2 kg (104 lb 0.9 oz)    History of present illness:  32 year old female with history of diabetes mellitus type 1 and Addison disease started experiencing nausea and vomiting last evening and felt uncomfortable at that point she called her mom who was in  [place]. Patient's mom reached [facility] and patient was brought to the ER because of the persistent nausea vomiting which typically happens when she gets DKA. By the time patient reached here she became very lethargic. Due to difficult IV insertion right  internal jugular vein central line was placed by the ER physician after which fluids were started along with subcutaneous insulin NovoLog 15 units were given. Patient also was given 100 mg of IV hydrocortisone given that patient has Addison's disease. After these measures patient became more alert awake and by the time I examine patient was completely alert and oriented to time place and person. Patient is not very forthcoming with history. On questioning if she had missed her doses she states he does not know. Denies any chest pain or shortness of breath abdominal pain fever chills headache any focal deficits. Patient has been admitted for further management. Initial labs show low bicarbonate with high anion gap suggestive of DKA.     Discharge Exam: Filed Vitals:   08/26/12 0700  BP: 106/66  Pulse: 81  Temp:   Resp: 18   Filed Vitals:   08/25/12 2000 08/26/12 0105 08/26/12 0327 08/26/12 0700  BP:    106/66  Pulse: 96   81  Temp: 98.7 F (37.1 C) 97.9 F (36.6 C) 98.1 F (36.7 C)   TempSrc: Oral  Oral   Resp: 20   18  Height:      Weight:  55 kg (121 lb 4.1 oz)    SpO2: 100%   96%   General: A&O x 3, NAD, pleasant, cooperative Cardiovascular: RRR, no rub, no gallop, no S3 Respiratory: CTAB, no wheeze, no rhonchi Abdomen:soft, nontender, nondistended, positive bowel sounds Extremities: No edema, No lymphangitis, no petechiae  Discharge Instructions     Medication List     As of 08/26/2012  8:26 AM    ASK your doctor about these medications         citalopram 20 MG tablet   Commonly known as: CELEXA   Take 1 tablet (20 mg total) by mouth daily.      fludrocortisone 0.1 MG tablet   Commonly known as: FLORINEF   Take 0.1 mg by mouth daily.      hydrocortisone 10 MG tablet   Commonly known as: CORTEF   Take 10 mg by mouth 2 (two) times daily. 10 mg every morning and 15 every evening      insulin aspart 100 UNIT/ML injection   Commonly known as: novoLOG   Inject 5  Units into the skin 3 (three) times daily with meals.      insulin glargine 100 UNIT/ML injection   Commonly known as: LANTUS   Inject 20 Units into the skin daily.      norethindrone 0.35 MG tablet   Commonly known as: MICRONOR,CAMILA,ERRIN   Take 1 tablet (0.35 mg total) by mouth daily.      rOPINIRole 0.25 MG tablet   Commonly known as: REQUIP   Take 0.25 mg by mouth at bedtime.      valACYclovir 1000 MG tablet   Commonly known as: VALTREX   Take 1 tablet (1,000 mg total) by mouth 2 (two) times daily.         The results of significant diagnostics from this hospitalization (including imaging, microbiology, ancillary and laboratory) are listed below for reference.    Significant Diagnostic Studies: Dg Chest Portable 1 View  08/24/2012  *RADIOLOGY REPORT*  Clinical Data: Nausea and vomiting; hyperglycemia.  PORTABLE  CHEST - 1 VIEW  Comparison: Chest radiograph performed 01/16/2012  Findings: The lungs are well-aerated and clear.  There is no evidence of focal opacification, pleural effusion or pneumothorax.  The cardiomediastinal silhouette is within normal limits.  A right IJ line is noted ending about the mid SVC.  No acute osseous abnormalities are seen.  IMPRESSION: No acute cardiopulmonary process seen.   Original Report Authenticated By: Tonia Ghent, M.D.      Microbiology: Recent Results (from the past 240 hour(s))  CULTURE, BLOOD (ROUTINE X 2)     Status: Normal (Preliminary result)   Collection Time   08/24/12  4:58 AM      Component Value Range Status Comment   Specimen Description BLOOD CENTRAL LINE    Final    Special Requests BOTTLES DRAWN AEROBIC AND ANAEROBIC   Final    Culture  Setup Time 08/24/2012 10:28   Final    Culture     Final    Value:        BLOOD CULTURE RECEIVED NO GROWTH TO DATE CULTURE WILL BE HELD FOR 5 DAYS BEFORE ISSUING A FINAL NEGATIVE REPORT   Report Status PENDING   Incomplete   CULTURE, BLOOD (ROUTINE X 2)     Status: Normal  (Preliminary result)   Collection Time   08/24/12  5:15 AM      Component Value Range Status Comment   Specimen Description BLOOD LEFT HAND   Final    Special Requests BOTTLES DRAWN AEROBIC AND ANAEROBIC   Final    Culture  Setup Time 08/24/2012 10:28   Final    Culture     Final    Value:        BLOOD CULTURE RECEIVED NO GROWTH TO DATE CULTURE WILL BE HELD FOR 5 DAYS BEFORE ISSUING A FINAL NEGATIVE REPORT   Report Status PENDING   Incomplete   URINE CULTURE     Status: Normal   Collection Time   08/24/12  5:45 AM      Component Value Range Status Comment   Specimen Description URINE, CATHETERIZED   Final    Special Requests NONE   Final    Culture  Setup Time 08/24/2012 11:48   Final    Colony Count NO GROWTH   Final    Culture NO GROWTH   Final    Report Status 08/25/2012 FINAL   Final   MRSA PCR SCREENING     Status: Normal   Collection Time   08/24/12  7:20 PM      Component Value Range Status Comment   MRSA by PCR NEGATIVE  NEGATIVE Final      Labs: Basic Metabolic Panel:  Lab 08/26/12 1610 08/26/12 0345 08/26/12 0205 08/26/12 0010 08/25/12 2210  NA 136 133* 135 136 134*  K 3.8 3.5 -- -- --  CL 110 105 107 109 104  CO2 19 18* 20 19 20   GLUCOSE 143* 150* 74 143* 254*  BUN 11 11 11 10 10   CREATININE 0.51 0.54 0.57 0.57 0.67  CALCIUM 7.6* 7.9* 7.8* 7.7* 7.7*  MG -- -- -- -- --  PHOS -- -- -- -- --   Liver Function Tests:  Lab 08/24/12 0605  AST 18  ALT 12  ALKPHOS 91  BILITOT 0.3  PROT 6.4  ALBUMIN 3.1*   No results found for this basename: LIPASE:5,AMYLASE:5 in the last 168 hours  Lab 08/24/12 0620  AMMONIA 24   CBC:  Lab 08/24/12 9604  08/24/12 0255  WBC 11.2* 16.9*  NEUTROABS 8.9* 13.3*  HGB 14.4 18.0*  HCT 39.0 48.2*  MCV 87.8 88.4  PLT 291 316   Cardiac Enzymes:  Lab 08/24/12 0605  CKTOTAL --  CKMB --  CKMBINDEX --  TROPONINI <0.30   BNP: No components found with this basename: POCBNP:5 CBG:  Lab 08/26/12 0639 08/26/12 0539  08/26/12 0442 08/26/12 0325 08/26/12 0226  GLUCAP 139* 116* 134* 112* 104*    Time coordinating discharge:  Greater than 30 minutes  Signed:  Hermilo Dutter, DO [facility] Pager: [phone] 08/26/2012, 8:26 AM

## 2012-08-26 NOTE — Progress Notes (Signed)
Insulin drip off since 0326 gluco-stabilizer and Q1 hour CBG continue Will monitor closely

## 2012-08-29 LAB — CULTURE, BLOOD (ROUTINE X 2)

## 2012-10-11 ENCOUNTER — Encounter (HOSPITAL_COMMUNITY): Payer: Self-pay | Admitting: *Deleted

## 2012-10-11 ENCOUNTER — Inpatient Hospital Stay (HOSPITAL_COMMUNITY)
Admission: EM | Admit: 2012-10-11 | Discharge: 2012-10-13 | DRG: 638 | Disposition: A | Discharge status: Home / Self Care | Payer: Managed Care, Other (non HMO) | Attending: Internal Medicine | Admitting: Internal Medicine

## 2012-10-11 DIAGNOSIS — F329 Major depressive disorder, single episode, unspecified: Secondary | ICD-10-CM

## 2012-10-11 DIAGNOSIS — N179 Acute kidney failure, unspecified: Secondary | ICD-10-CM | POA: Diagnosis present

## 2012-10-11 DIAGNOSIS — R112 Nausea with vomiting, unspecified: Secondary | ICD-10-CM

## 2012-10-11 DIAGNOSIS — F3289 Other specified depressive episodes: Secondary | ICD-10-CM

## 2012-10-11 DIAGNOSIS — G9340 Encephalopathy, unspecified: Secondary | ICD-10-CM

## 2012-10-11 DIAGNOSIS — E1110 Type 2 diabetes mellitus with ketoacidosis without coma: Secondary | ICD-10-CM | POA: Diagnosis present

## 2012-10-11 DIAGNOSIS — I959 Hypotension, unspecified: Secondary | ICD-10-CM | POA: Diagnosis present

## 2012-10-11 DIAGNOSIS — Z794 Long term (current) use of insulin: Secondary | ICD-10-CM

## 2012-10-11 DIAGNOSIS — E2749 Other adrenocortical insufficiency: Secondary | ICD-10-CM | POA: Diagnosis present

## 2012-10-11 DIAGNOSIS — Z9119 Patient's noncompliance with other medical treatment and regimen: Secondary | ICD-10-CM

## 2012-10-11 DIAGNOSIS — E1065 Type 1 diabetes mellitus with hyperglycemia: Secondary | ICD-10-CM

## 2012-10-11 DIAGNOSIS — D72829 Elevated white blood cell count, unspecified: Secondary | ICD-10-CM | POA: Diagnosis present

## 2012-10-11 DIAGNOSIS — E871 Hypo-osmolality and hyponatremia: Secondary | ICD-10-CM | POA: Diagnosis present

## 2012-10-11 DIAGNOSIS — IMO0002 Reserved for concepts with insufficient information to code with codable children: Secondary | ICD-10-CM | POA: Diagnosis present

## 2012-10-11 DIAGNOSIS — R4182 Altered mental status, unspecified: Secondary | ICD-10-CM | POA: Diagnosis present

## 2012-10-11 DIAGNOSIS — R059 Cough, unspecified: Secondary | ICD-10-CM | POA: Diagnosis not present

## 2012-10-11 DIAGNOSIS — Z91199 Patient's noncompliance with other medical treatment and regimen due to unspecified reason: Secondary | ICD-10-CM

## 2012-10-11 DIAGNOSIS — E271 Primary adrenocortical insufficiency: Secondary | ICD-10-CM | POA: Diagnosis present

## 2012-10-11 DIAGNOSIS — R05 Cough: Secondary | ICD-10-CM | POA: Diagnosis not present

## 2012-10-11 DIAGNOSIS — G2581 Restless legs syndrome: Secondary | ICD-10-CM

## 2012-10-11 DIAGNOSIS — E1010 Type 1 diabetes mellitus with ketoacidosis without coma: Principal | ICD-10-CM | POA: Diagnosis present

## 2012-10-11 DIAGNOSIS — F32A Depression, unspecified: Secondary | ICD-10-CM | POA: Diagnosis present

## 2012-10-11 DIAGNOSIS — Z79899 Other long term (current) drug therapy: Secondary | ICD-10-CM

## 2012-10-11 DIAGNOSIS — Z881 Allergy status to other antibiotic agents status: Secondary | ICD-10-CM

## 2012-10-11 LAB — BASIC METABOLIC PANEL
BUN: 21 mg/dL (ref 6–23)
BUN: 18 mg/dL (ref 6–23)
CO2: 8 mEq/L — CL (ref 19–32)
CO2: 17 mEq/L — ABNORMAL LOW (ref 19–32)
Calcium: 9.5 mg/dL (ref 8.4–10.5)
Calcium: 8.4 mg/dL (ref 8.4–10.5)
Calcium: 8.5 mg/dL (ref 8.4–10.5)
Chloride: 78 mEq/L — ABNORMAL LOW (ref 96–112)
Chloride: 97 mEq/L (ref 96–112)
Chloride: 100 mEq/L (ref 96–112)
Chloride: 98 mEq/L (ref 96–112)
Creatinine, Ser: 1.2 mg/dL — ABNORMAL HIGH (ref 0.50–1.10)
Creatinine, Ser: 1.05 mg/dL (ref 0.50–1.10)
Creatinine, Ser: 0.88 mg/dL (ref 0.50–1.10)
Creatinine, Ser: 0.73 mg/dL (ref 0.50–1.10)
GFR calc Af Amer: 80 mL/min — ABNORMAL LOW (ref >90)
GFR calc Af Amer: >90 mL/min (ref >90)
GFR calc Af Amer: >90 mL/min (ref >90)
GFR calc Af Amer: >90 mL/min (ref >90)
GFR calc non Af Amer: 69 mL/min — ABNORMAL LOW (ref >90)
GFR calc non Af Amer: 87 mL/min — ABNORMAL LOW (ref >90)
Glucose, Bld: 704 mg/dL (ref 70–99)
Glucose, Bld: 148 mg/dL — ABNORMAL HIGH (ref 70–99)
Potassium: 4.3 mEq/L (ref 3.5–5.1)
Sodium: 124 mEq/L — ABNORMAL LOW (ref 135–145)
Sodium: 132 mEq/L — ABNORMAL LOW (ref 135–145)
Sodium: 130 mEq/L — ABNORMAL LOW (ref 135–145)

## 2012-10-11 LAB — BLOOD GAS, VENOUS
Bicarbonate: 7.7 mEq/L — ABNORMAL LOW (ref 20.0–24.0)
FIO2: 0.21 %
O2 Saturation: 65.1 %
Patient temperature: 98.6
TCO2: 7.5 mmol/L (ref 0–100)

## 2012-10-11 LAB — CBC WITH DIFFERENTIAL/PLATELET
Eosinophils Relative: 0 % (ref 0–5)
HCT: 49.3 % — ABNORMAL HIGH (ref 36.0–46.0)
Lymphs Abs: 3.8 10*3/uL (ref 0.7–4.0)
MCH: 32.6 pg (ref 26.0–34.0)
MCV: 92.5 fL (ref 78.0–100.0)
Monocytes Absolute: 2.6 10*3/uL — ABNORMAL HIGH (ref 0.1–1.0)
Neutro Abs: 22.8 10*3/uL — ABNORMAL HIGH (ref 1.7–7.7)
Platelets: 400 10*3/uL (ref 150–400)
RBC: 5.33 MIL/uL — ABNORMAL HIGH (ref 3.87–5.11)

## 2012-10-11 LAB — GLUCOSE, CAPILLARY
Glucose-Capillary: 518 mg/dL — ABNORMAL HIGH (ref 70–99)
Glucose-Capillary: 373 mg/dL — ABNORMAL HIGH (ref 70–99)
Glucose-Capillary: 222 mg/dL — ABNORMAL HIGH (ref 70–99)
Glucose-Capillary: 209 mg/dL — ABNORMAL HIGH (ref 70–99)
Glucose-Capillary: 153 mg/dL — ABNORMAL HIGH (ref 70–99)
Glucose-Capillary: 130 mg/dL — ABNORMAL HIGH (ref 70–99)
Glucose-Capillary: 124 mg/dL — ABNORMAL HIGH (ref 70–99)

## 2012-10-11 LAB — URINALYSIS, ROUTINE W REFLEX MICROSCOPIC
Hgb urine dipstick: NEGATIVE
Nitrite: NEGATIVE
Protein, ur: NEGATIVE mg/dL
Urobilinogen, UA: 0.2 mg/dL (ref 0.0–1.0)

## 2012-10-11 LAB — RAPID URINE DRUG SCREEN, HOSP PERFORMED
Amphetamines: NOT DETECTED
Opiates: NOT DETECTED
Tetrahydrocannabinol: NOT DETECTED

## 2012-10-11 LAB — POCT PREGNANCY, URINE: Preg Test, Ur: NEGATIVE

## 2012-10-11 MED ORDER — SODIUM CHLORIDE 0.9 % IV BOLUS (SEPSIS)
2000.0000 mL | Freq: Once | INTRAVENOUS | Status: AC
  Administered 2012-10-11: 2000 mL via INTRAVENOUS

## 2012-10-11 MED ORDER — HYDROCORTISONE SOD SUCCINATE 100 MG IJ SOLR
100.0000 mg | Freq: Once | INTRAMUSCULAR | Status: AC
  Administered 2012-10-11: 100 mg via INTRAVENOUS
  Filled 2012-10-11: qty 2

## 2012-10-11 MED ORDER — SODIUM CHLORIDE 0.9 % IV SOLN
INTRAVENOUS | Status: DC
  Administered 2012-10-11: 5.4 [IU]/h via INTRAVENOUS
  Administered 2012-10-12: 2.7 [IU]/h via INTRAVENOUS
  Filled 2012-10-11 (×2): qty 1

## 2012-10-11 MED ORDER — ROPINIROLE HCL 0.25 MG PO TABS
0.2500 mg | ORAL_TABLET | Freq: Every day | ORAL | Status: DC
  Administered 2012-10-11 – 2012-10-12 (×2): 0.25 mg via ORAL
  Filled 2012-10-11 (×3): qty 1

## 2012-10-11 MED ORDER — NORETHINDRONE 0.35 MG PO TABS: 1.0000 | ORAL_TABLET | Freq: Every day | ORAL | Status: DC

## 2012-10-11 MED ORDER — SODIUM CHLORIDE 0.9 % IV SOLN
INTRAVENOUS | Status: DC
  Administered 2012-10-11 – 2012-10-12 (×2): via INTRAVENOUS
  Administered 2012-10-12: 150 mL via INTRAVENOUS
  Administered 2012-10-12: 17:00:00 via INTRAVENOUS
  Administered 2012-10-13: 150 mL via INTRAVENOUS

## 2012-10-11 MED ORDER — DEXTROSE-NACL 5-0.45 % IV SOLN
INTRAVENOUS | Status: DC
  Administered 2012-10-11: 17:00:00 via INTRAVENOUS
  Administered 2012-10-11: 100 mL/h via INTRAVENOUS
  Administered 2012-10-12: 04:00:00 via INTRAVENOUS

## 2012-10-11 MED ORDER — ONDANSETRON HCL 4 MG/2ML IJ SOLN
4.0000 mg | Freq: Once | INTRAMUSCULAR | Status: AC
  Administered 2012-10-11: 4 mg via INTRAVENOUS
  Filled 2012-10-11: qty 2

## 2012-10-11 MED ORDER — HYDROCORTISONE SOD SUCCINATE 100 MG IJ SOLR
50.0000 mg | Freq: Two times a day (BID) | INTRAMUSCULAR | Status: DC
  Administered 2012-10-11 – 2012-10-12 (×2): 50 mg via INTRAVENOUS
  Filled 2012-10-11: qty 1
  Filled 2012-10-11 (×2): qty 2
  Filled 2012-10-11: qty 1

## 2012-10-11 MED ORDER — PANTOPRAZOLE SODIUM 40 MG IV SOLR
40.0000 mg | Freq: Once | INTRAVENOUS | Status: AC
  Administered 2012-10-11: 40 mg via INTRAVENOUS
  Filled 2012-10-11: qty 40

## 2012-10-11 MED ORDER — HYDROCORTISONE SOD SUCCINATE 100 MG IJ SOLR: 10.0000 mg | INTRAMUSCULAR | Status: DC

## 2012-10-11 MED ORDER — HEPARIN SODIUM (PORCINE) 5000 UNIT/ML IJ SOLN
5000.0000 [IU] | Freq: Three times a day (TID) | INTRAMUSCULAR | Status: DC
  Administered 2012-10-11 – 2012-10-13 (×7): 5000 [IU] via SUBCUTANEOUS
  Filled 2012-10-11 (×10): qty 1

## 2012-10-11 MED ORDER — SODIUM CHLORIDE 0.9 % IV SOLN
INTRAVENOUS | Status: DC
  Administered 2012-10-11: 05:00:00 via INTRAVENOUS
  Filled 2012-10-11: qty 250

## 2012-10-11 MED ORDER — ALBUTEROL (5 MG/ML) CONTINUOUS INHALATION SOLN
INHALATION_SOLUTION | RESPIRATORY_TRACT | Status: AC
  Administered 2012-10-11: 2.5 mg
  Filled 2012-10-11: qty 20

## 2012-10-11 MED ORDER — DEXTROSE 50 % IV SOLN: 25.0000 mL | INTRAVENOUS | Status: DC | PRN

## 2012-10-11 MED ORDER — CITALOPRAM HYDROBROMIDE 20 MG PO TABS
20.0000 mg | ORAL_TABLET | Freq: Every day | ORAL | Status: DC
Start: 2012-10-11 — End: 2012-10-13
  Administered 2012-10-12 – 2012-10-13 (×2): 20 mg via ORAL
  Filled 2012-10-11 (×3): qty 1

## 2012-10-11 MED ORDER — POTASSIUM CHLORIDE 10 MEQ/100ML IV SOLN
10.0000 meq | INTRAVENOUS | Status: AC
  Administered 2012-10-11: 10 meq via INTRAVENOUS
  Filled 2012-10-11 (×2): qty 100

## 2012-10-11 MED ORDER — ALBUTEROL SULFATE (5 MG/ML) 0.5% IN NEBU
INHALATION_SOLUTION | RESPIRATORY_TRACT | Status: AC
  Filled 2012-10-11: qty 1

## 2012-10-11 NOTE — ED Notes (Signed)
ZOX:[id number]<AV> Expected date:<BR> Expected time:<BR> Means of arrival:<BR> Comments:<BR> Room 7

## 2012-10-11 NOTE — ED Notes (Signed)
PER EMS, ARRIVED TO PT HOME, PT APPEARED AND STATES SHE IS DEPRESSED, NOT MOVED FROM BED NOT CHECKED SUGAR NOR EATEN IN LAST 3 DAYS, VOMITING IN CUPS AT HOME.PT A & O X 4 WEAK. SHE LOST JOB AND HAS BEEN OUT OF WORK.  CBG READING ON GLYCOMETER READ HIGH

## 2012-10-11 NOTE — ED Notes (Signed)
Critical Lab Values called by Thomasene Lot. Glucose 704; CO2 8 RN notified

## 2012-10-11 NOTE — ED Provider Notes (Signed)
History     CSN: [id number]  Arrival date & time 10/11/12  7829   First MD Initiated Contact with Patient 10/11/12 0355      Chief Complaint  Patient presents with  . Hyperglycemia    (Consider location/radiation/quality/duration/timing/severity/associated sxs/prior treatment) HPI Level 5 Caveat: altered mental status. This is a 32 year old female with a history of catatonic depression and type 1 diabetes. She has been admitted for diabetic ketoacidosis in the past. Due to her depression she has stayed bedridden for the last 3 days. She has not eaten or taken her medications. She was brought by ambulance this morning for persistent vomiting, weakness and hyperglycemia. Her CBG reading on arrival was "high". The depression was exacerbated by recently losing her job.  Past Medical History  Diagnosis Date  . Diabetes mellitus   . Addison disease   . Allergy   . Addison disease     Past Surgical History  Procedure Laterality Date  . Wisdom tooth extraction      Family History  Problem Relation Age of Onset  . Alzheimer's disease Maternal Grandmother   . Diabetes Paternal Grandmother   . Cancer Paternal Grandfather     History  Substance Use Topics  . Smoking status: Never Smoker   . Smokeless tobacco: Never Used  . Alcohol Use: 1.2 oz/week    2 Cans of beer per week     Comment: occasionally    OB History   Grav Para Term Preterm Abortions TAB SAB Ect Mult Living                  Review of Systems  Unable to perform ROS   Allergies  Augmentin  Home Medications   Current Outpatient Rx  Name  Route  Sig  Dispense  Refill  . hydrocortisone (CORTEF) 10 MG tablet   Oral   Take 10 mg by mouth 2 (two) times daily. 10 mg every morning and 15 every evening         . insulin aspart (NOVOLOG) 100 UNIT/ML injection   Subcutaneous   Inject 5 Units into the skin 3 (three) times daily with meals.   1 vial      . insulin glargine (LANTUS) 100 UNIT/ML  injection   Subcutaneous   Inject 20 Units into the skin daily.   10 mL      . citalopram (CELEXA) 20 MG tablet   Oral   Take 1 tablet (20 mg total) by mouth daily.   30 tablet   5   . norethindrone (ERRIN) 0.35 MG tablet   Oral   Take 1 tablet (0.35 mg total) by mouth daily.   1 Package   11   . rOPINIRole (REQUIP) 0.25 MG tablet   Oral   Take 0.25 mg by mouth at bedtime.          . valACYclovir (VALTREX) 1000 MG tablet   Oral   Take 1 tablet (1,000 mg total) by mouth 2 (two) times daily.   60 tablet   0     BP 92/57  Pulse 114  Temp(Src) 97.8 F (36.6 C) (Oral)  Resp 19  SpO2 97%  Physical Exam General: Well-developed, well-nourished female in no acute distress; appearance consistent with age of record HENT: normocephalic, atraumatic; oral mucosa dry; breath smells of ketones Eyes: pupils equal round and reactive to light; extraocular muscles intact Neck: supple Heart: regular rate and rhythm; tachycardia Lungs: clear to auscultation bilaterally Abdomen: soft; nondistended; nontender; no  masses or hepatosplenomegaly; bowel sounds present Extremities: No deformity; full range of motion; pulses weak Neurologic: Awake, lethargic, oriented; motor function intact in all extremities and symmetric; no facial droop Skin: Warm and dry Psychiatric: Depressed; flat affect    ED Course  Procedures (including critical care time)  CRITICAL CARE Performed by: Tab Rylee L   Total critical care time: 60 minutes  Critical care time was exclusive of separately billable procedures and treating other patients.  Critical care was necessary to treat or prevent imminent or life-threatening deterioration.  Critical care was time spent personally by me on the following activities: development of treatment plan with patient and/or surrogate as well as nursing, discussions with consultants, evaluation of patient's response to treatment, examination of patient, obtaining  history from patient or surrogate, ordering and performing treatments and interventions, ordering and review of laboratory studies, ordering and review of radiographic studies, pulse oximetry and re-evaluation of patient's condition.    MDM   Nursing notes and vitals signs, including pulse oximetry, reviewed.  Summary of this visit's results, reviewed by myself:  Labs:  Results for orders placed during the hospital encounter of 10/11/12 (from the past 24 hour(s))  BASIC METABOLIC PANEL     Status: Abnormal   Collection Time    10/11/12  3:39 AM      Result Value Range   Sodium 124 (*) 135 - 145 mEq/L   Potassium 4.8  3.5 - 5.1 mEq/L   Chloride 78 (*) 96 - 112 mEq/L   CO2 8 (*) 19 - 32 mEq/L   Glucose, Bld 704 (*) 70 - 99 mg/dL   BUN 24 (*) 6 - 23 mg/dL   Creatinine, Ser 9.60 (*) 0.50 - 1.10 mg/dL   Calcium 9.5  8.4 - 45.4 mg/dL   GFR calc non Af Amer 59 (*) >90 mL/min   GFR calc Af Amer 68 (*) >90 mL/min  CBC WITH DIFFERENTIAL     Status: Abnormal   Collection Time    10/11/12  3:39 AM      Result Value Range   WBC 29.2 (*) 4.0 - 10.5 K/uL   RBC 5.33 (*) 3.87 - 5.11 MIL/uL   Hemoglobin 17.4 (*) 12.0 - 15.0 g/dL   HCT 09.8 (*) 11.9 - 14.7 %   MCV 92.5  78.0 - 100.0 fL   MCH 32.6  26.0 - 34.0 pg   MCHC 35.3  30.0 - 36.0 g/dL   RDW 82.9  56.2 - 13.0 %   Platelets 400  150 - 400 K/uL   Neutrophils Relative 78 (*) 43 - 77 %   Lymphocytes Relative 13  12 - 46 %   Monocytes Relative 9  3 - 12 %   Eosinophils Relative 0  0 - 5 %   Basophils Relative 0  0 - 1 %   Neutro Abs 22.8 (*) 1.7 - 7.7 K/uL   Lymphs Abs 3.8  0.7 - 4.0 K/uL   Monocytes Absolute 2.6 (*) 0.1 - 1.0 K/uL   Eosinophils Absolute 0.0  0.0 - 0.7 K/uL   Basophils Absolute 0.0  0.0 - 0.1 K/uL   Smear Review LARGE PLATELETS PRESENT    BLOOD GAS, VENOUS     Status: Abnormal   Collection Time    10/11/12  4:13 AM      Result Value Range   FIO2 0.21     pH, Ven 7.014 (*) 7.250 - 7.300   pCO2, Ven 31.9 (*) 45.0  - 50.0 mmHg  pO2, Ven 43.9  30.0 - 45.0 mmHg   Bicarbonate 7.7 (*) 20.0 - 24.0 mEq/L   TCO2 7.5  0 - 100 mmol/L   Acid-base deficit 24.5 (*) 0.0 - 2.0 mmol/L   O2 Saturation 65.1     Patient temperature 98.6     Collection site VEIN     Drawn by COLLECTED BY NURSE     Sample type VEIN    URINALYSIS, ROUTINE W REFLEX MICROSCOPIC     Status: Abnormal   Collection Time    10/11/12  4:33 AM      Result Value Range   Color, Urine YELLOW  YELLOW   APPearance CLEAR  CLEAR   Specific Gravity, Urine 1.030  1.005 - 1.030   pH 5.5  5.0 - 8.0   Glucose, UA >1000 (*) NEGATIVE mg/dL   Hgb urine dipstick NEGATIVE  NEGATIVE   Bilirubin Urine NEGATIVE  NEGATIVE   Ketones, ur >80 (*) NEGATIVE mg/dL   Protein, ur NEGATIVE  NEGATIVE mg/dL   Urobilinogen, UA 0.2  0.0 - 1.0 mg/dL   Nitrite NEGATIVE  NEGATIVE   Leukocytes, UA NEGATIVE  NEGATIVE  URINE MICROSCOPIC-ADD ON     Status: None   Collection Time    10/11/12  4:33 AM      Result Value Range   Squamous Epithelial / LPF RARE  RARE   Urine-Other AMORPHOUS URATES/PHOSPHATES      EKG Interpretation:  Date & Time: 10/11/2012 3:51 AM  Rate: 123  Rhythm: sinus tachycardia  QRS Axis: normal  Intervals: normal  ST/T Wave abnormalities: tall peaked Ts  Conduction Disutrbances:none  Narrative Interpretation:   Old EKG Reviewed: Tall peaked T's not seen previously  4:48 AM IV fluid bolus started. Insulin drip ordered. Solu-Cortef bolus and drip ordered.  6:05 AM Final hospitalist admitting patient to step down.       Hanley Seamen, MD 10/11/12 [phone]

## 2012-10-11 NOTE — ED Notes (Signed)
EAV:[id number]> Expected date:10/11/12<BR> Expected time: 1:54 AM<BR> Means of arrival:Ambulance<BR> Comments:<BR> RM 7: Hyperglycemia

## 2012-10-11 NOTE — H&P (Addendum)
[facility] History and Physical  [name] WUJ:[id number] DOB: Sep 04, 1980 DOA: 10/11/2012  Referring physician: ED PCP: Gweneth Dimitri, MD  Specialists: None  Chief Complaint: AMS, DKA  HPI: [name] is a 32 y.o. female h/o DM1 with multiple episodes of DKA in the past (most recently in Jan of this year) who presents to the ED after her friend called EMS because of AMS, and persistent N/V at home.  Apparently the patient lost her job and so became very depressed, hasnt taken her blood glucose since Tuesday.  She also wasn't eating for the past 3 days, or taking much in the way of insulin she states.  Unfortunately, she began to have N/V and ultimately AMS at home.  In the ED patient was found to have DKA with bicarb of 8 , while she does have a WBC of 29k, no obvious source of infection nor symptoms of infection are noted.  Thankfully the patients AMS has improved since arrival with volume resuscitation, hospitalist has been asked to admit.  Review of Systems: 12 systems reviewed and otherwise negative.  Past Medical History  Diagnosis Date  . Diabetes mellitus   . Addison disease   . Allergy   . Addison disease    Past Surgical History  Procedure Laterality Date  . Wisdom tooth extraction     Social History:  reports that she has never smoked. She has never used smokeless tobacco. She reports that she drinks about 1.2 ounces of alcohol per week. She reports that she does not use illicit drugs.   Allergies  Allergen Reactions  . Augmentin (Amoxicillin-Pot Clavulanate) Diarrhea    Family History  Problem Relation Age of Onset  . Alzheimer's disease Maternal Grandmother   . Diabetes Paternal Grandmother   . Cancer Paternal Grandfather     Prior to Admission medications   Medication Sig Start Date End Date Taking? Authorizing Provider  hydrocortisone (CORTEF) 10 MG tablet Take 10 mg by mouth 2 (two) times daily. 10 mg every morning and 15 every evening   Yes  Historical Provider, MD  insulin aspart (NOVOLOG) 100 UNIT/ML injection Inject 5 Units into the skin 3 (three) times daily with meals. 02/15/12 02/14/13 Yes Jessica U Vann, DO  insulin glargine (LANTUS) 100 UNIT/ML injection Inject 20 Units into the skin daily. 02/15/12 02/14/13 Yes Jessica U Vann, DO  citalopram (CELEXA) 20 MG tablet Take 1 tablet (20 mg total) by mouth daily. 06/15/12   Morrell Riddle, PA-C  norethindrone (ERRIN) 0.35 MG tablet Take 1 tablet (0.35 mg total) by mouth daily. 06/15/12   Morrell Riddle, PA-C  rOPINIRole (REQUIP) 0.25 MG tablet Take 0.25 mg by mouth at bedtime.     Historical Provider, MD  valACYclovir (VALTREX) 1000 MG tablet Take 1 tablet (1,000 mg total) by mouth 2 (two) times daily. 06/17/12   Nelva Nay, PA-C   Physical Exam: Filed Vitals:   10/11/12 0317  BP: 92/57  Pulse: 114  Temp: 97.8 F (36.6 C)  TempSrc: Oral  Resp: 19  SpO2: 97%     General:  resting comfortably in bed, somewhat lethargic  Eyes: PEERLA EOMI  ENT: mucous membranes moist  Neck: supple w/o JVD  Cardiovascular: RRR w/o MRG  Respiratory: CTA B  Abdomen: soft, nt, nd, bs+  Skin: no rash nor lesion  Musculoskeletal: MAE, full ROM all 4 extremities  Psychiatric: depressed tone and affect, ? Depression vs lethargy due to DKA  Neurologic: somewhat sleepy and lethargic but is able to  wake up, obey commands, and indicate that she is AAOx3   Labs on Admission:  Basic Metabolic Panel:  Recent Labs Lab 10/11/12 0339  NA 124*  K 4.8  CL 78*  CO2 8*  GLUCOSE 704*  BUN 24*  CREATININE 1.20*  CALCIUM 9.5   Liver Function Tests: No results found for this basename: AST, ALT, ALKPHOS, BILITOT, PROT, ALBUMIN,  in the last 168 hours No results found for this basename: LIPASE, AMYLASE,  in the last 168 hours No results found for this basename: AMMONIA,  in the last 168 hours CBC:  Recent Labs Lab 10/11/12 0339  WBC 29.2*  NEUTROABS 22.8*  HGB 17.4*  HCT 49.3*   MCV 92.5  PLT 400   Cardiac Enzymes: No results found for this basename: CKTOTAL, CKMB, CKMBINDEX, TROPONINI,  in the last 168 hours  BNP (last 3 results) No results found for this basename: PROBNP,  in the last 8760 hours CBG: No results found for this basename: GLUCAP,  in the last 168 hours  Radiological Exams on Admission: No results found.  EKG: Independently reviewed.  Assessment/Plan Principal Problem:   DKA (diabetic ketoacidoses) Active Problems:   DM I (diabetes mellitus, type I), uncontrolled   Addison disease   Leukocytosis   Depression   AKI (acute kidney injury)   1. DKA - on DKA protocol, receiving IV insulin, fluids, believe that this is due to compliance: patient flat out states she got depressed and so essentially neglected herself, hasnt taken BGL in 5 days, eaten anything, or taken much insulin.  On fluids, insulin, and labs per protocol BMP q2h.  Admitting to stepdown as per routine.  Initial pH 7.01, likely already improving as her mental status has improved already.  Initial AG 38.  Lactate pending 2. Depression - may need psych eval after DKA improves, difficult to say wether lethargy due to recovering DKA vs catatonia, history certainly suggests a severe adjustment disorder at the least. 3. Addison's disease - was slightly hypotensive initially in ED (suspect that with the n/v she hasnt been keeping her home steroids down), so was given solucortef 100mg .  Have ordered to continue solucortef 50mg  IV q12h as stress dose steroids for now. 4. Leukocytosis - likely reactive / hemoconcentration, no source identified at this point in time, will continue to monitor. 5. Pseudohyponatremia - corrected sodium 133 6. AKI - secondary to dehydration, holding valcyclovir and aggressively rehydrating the patient as per DKA protocol.    Code Status: Full Code (must indicate code status--if unknown or must be presumed, indicate so) Family Communication: Spoke with friend  at bedside (parents are still en route to hospital) (indicate person spoken with, if applicable, with phone number if by telephone) Disposition Plan: Admit to inpatient (indicate anticipated LOS)  Time spent: 70 min  GARDNER, JARED M. [facility] Pager [phone]  If 7PM-7AM, please contact night-coverage [email or web address] Password [facility] 10/11/2012, 5:45 AM

## 2012-10-11 NOTE — Progress Notes (Signed)
This is the addendum to earlier admission note by Dr. Julian Reil.  [facility] PROGRESS NOTE  [name] ZOX:[id number] DOB: Aug 06, 1980 DOA: 10/11/2012 PCP: Gweneth Dimitri, MD  HPI: Pt admitted for DKA in the setting of medical non compliance.  Assessment and plan: I have reviewed current blood work and vital signs. Pt is hemodynamically stable. Continue to trend BMP to assess anion gap. Pt is clinically improving. Obtain follow up CBC. I suspect that his leukocytosis mostly secondary to steroid administration rather than true infectious etiology. Discussed with mother at bedside who would like psych consultation as well as she explains that daughter has been having depressive like episodes. Will obtain psych consult once pt transferred to medical floor. Will also obtain consult for diabetic educator as pt apparently has not had education per mother, during the previous admissions  Objective: Filed Vitals:   10/11/12 0800 10/11/12 0900 10/11/12 1000 10/11/12 1100  BP: 109/73 106/69 104/63 94/75  Pulse:   117 117  Temp: 99.9 F (37.7 C) 98.6 F (37 C) 100.2 F (37.9 C) 100.4 F (38 C)  TempSrc:  Oral    Resp: 15 16 14 18   Height:  5\' 2"  (1.575 m)    Weight:  106 lb 0.7 oz (48.1 kg)    SpO2:  100% 100% 99%    Intake/Output Summary (Last 24 hours) at 10/11/12 1217 Last data filed at 10/11/12 1200  Gross per 24 hour  Intake 2080.7 ml  Output   1175 ml  Net  905.7 ml   Exam:   General:  Pt is alert, follows commands appropriately, not in acute distress  Cardiovascular: Regular rate and rhythm, S1/S2, no murmurs, no rubs, no gallops  Respiratory: Clear to auscultation bilaterally, no wheezing, no crackles, no rhonchi  Abdomen: Soft, non tender, non distended, bowel sounds present, no guarding  Data Reviewed: Basic Metabolic Panel:  Recent Labs Lab 10/11/12 0339 10/11/12 0840 10/11/12 1045  NA 124* 134* 132*  K 4.8 4.3 4.2  CL 78* 96 97  CO2 8* 13* 14*  GLUCOSE  704* 248* 207*  BUN 24* 21 19  CREATININE 1.20* 1.05 0.88  CALCIUM 9.5 8.6 8.4   CBC:  Recent Labs Lab 10/11/12 0339  WBC 29.2*  NEUTROABS 22.8*  HGB 17.4*  HCT 49.3*  MCV 92.5  PLT 400   CBG:  Recent Labs Lab 10/11/12 0821 10/11/12 0916 10/11/12 1020 10/11/12 1108 10/11/12 1204  GLUCAP 247* 271* 195* 222* 209*   Debbora Presto, MD [facility] Pager [phone]  If 7PM-7AM, please contact night-coverage [email or web address] Password [email or web address] 10/11/2012, 12:17 PM   LOS: 0 days

## 2012-10-12 ENCOUNTER — Inpatient Hospital Stay (HOSPITAL_COMMUNITY): Payer: Managed Care, Other (non HMO)

## 2012-10-12 DIAGNOSIS — N179 Acute kidney failure, unspecified: Secondary | ICD-10-CM

## 2012-10-12 LAB — BASIC METABOLIC PANEL
BUN: 12 mg/dL (ref 6–23)
BUN: 11 mg/dL (ref 6–23)
BUN: 10 mg/dL (ref 6–23)
BUN: 8 mg/dL (ref 6–23)
BUN: 7 mg/dL (ref 6–23)
BUN: 8 mg/dL (ref 6–23)
CO2: 18 mEq/L — ABNORMAL LOW (ref 19–32)
CO2: 18 mEq/L — ABNORMAL LOW (ref 19–32)
CO2: 18 mEq/L — ABNORMAL LOW (ref 19–32)
Calcium: 8.6 mg/dL (ref 8.4–10.5)
Calcium: 8.3 mg/dL — ABNORMAL LOW (ref 8.4–10.5)
Calcium: 7.4 mg/dL — ABNORMAL LOW (ref 8.4–10.5)
Calcium: 8.3 mg/dL — ABNORMAL LOW (ref 8.4–10.5)
Calcium: 8.4 mg/dL (ref 8.4–10.5)
Chloride: 99 mEq/L (ref 96–112)
Chloride: 102 mEq/L (ref 96–112)
Creatinine, Ser: 0.6 mg/dL (ref 0.50–1.10)
Creatinine, Ser: 0.6 mg/dL (ref 0.50–1.10)
Creatinine, Ser: 0.58 mg/dL (ref 0.50–1.10)
Creatinine, Ser: 0.51 mg/dL (ref 0.50–1.10)
Creatinine, Ser: 0.54 mg/dL (ref 0.50–1.10)
Creatinine, Ser: 0.67 mg/dL (ref 0.50–1.10)
GFR calc Af Amer: >90 mL/min (ref >90)
GFR calc Af Amer: >90 mL/min (ref >90)
GFR calc non Af Amer: >90 mL/min (ref >90)
GFR calc non Af Amer: >90 mL/min (ref >90)
GFR calc non Af Amer: >90 mL/min (ref >90)
GFR calc non Af Amer: >90 mL/min (ref >90)
Glucose, Bld: 191 mg/dL — ABNORMAL HIGH (ref 70–99)
Glucose, Bld: 203 mg/dL — ABNORMAL HIGH (ref 70–99)
Glucose, Bld: 135 mg/dL — ABNORMAL HIGH (ref 70–99)
Glucose, Bld: 140 mg/dL — ABNORMAL HIGH (ref 70–99)
Glucose, Bld: 113 mg/dL — ABNORMAL HIGH (ref 70–99)
Potassium: 3.4 mEq/L — ABNORMAL LOW (ref 3.5–5.1)
Potassium: 4.9 mEq/L (ref 3.5–5.1)
Potassium: 3.5 mEq/L (ref 3.5–5.1)
Sodium: 130 mEq/L — ABNORMAL LOW (ref 135–145)
Sodium: 136 mEq/L (ref 135–145)

## 2012-10-12 LAB — GLUCOSE, CAPILLARY
Glucose-Capillary: 129 mg/dL — ABNORMAL HIGH (ref 70–99)
Glucose-Capillary: 136 mg/dL — ABNORMAL HIGH (ref 70–99)
Glucose-Capillary: 171 mg/dL — ABNORMAL HIGH (ref 70–99)
Glucose-Capillary: 202 mg/dL — ABNORMAL HIGH (ref 70–99)
Glucose-Capillary: 127 mg/dL — ABNORMAL HIGH (ref 70–99)
Glucose-Capillary: 137 mg/dL — ABNORMAL HIGH (ref 70–99)
Glucose-Capillary: 151 mg/dL — ABNORMAL HIGH (ref 70–99)
Glucose-Capillary: 107 mg/dL — ABNORMAL HIGH (ref 70–99)
Glucose-Capillary: 148 mg/dL — ABNORMAL HIGH (ref 70–99)
Glucose-Capillary: 208 mg/dL — ABNORMAL HIGH (ref 70–99)
Glucose-Capillary: 125 mg/dL — ABNORMAL HIGH (ref 70–99)
Glucose-Capillary: 163 mg/dL — ABNORMAL HIGH (ref 70–99)

## 2012-10-12 LAB — CBC
HCT: 37 % (ref 36.0–46.0)
Hemoglobin: 13.3 g/dL (ref 12.0–15.0)
MCV: 87.5 fL (ref 78.0–100.0)
RBC: 4.23 MIL/uL (ref 3.87–5.11)
RDW: 12.1 % (ref 11.5–15.5)
WBC: 22.3 10*3/uL — ABNORMAL HIGH (ref 4.0–10.5)

## 2012-10-12 LAB — HEMOGLOBIN A1C: Hgb A1c MFr Bld: >20 % — ABNORMAL HIGH (ref <5.7)

## 2012-10-12 MED ORDER — LEVOFLOXACIN IN D5W 500 MG/100ML IV SOLN
500.0000 mg | INTRAVENOUS | Status: DC
  Administered 2012-10-12 – 2012-10-13 (×2): 500 mg via INTRAVENOUS
  Filled 2012-10-12 (×2): qty 100

## 2012-10-12 MED ORDER — SODIUM CHLORIDE 0.9 % IV BOLUS (SEPSIS)
1000.0000 mL | Freq: Once | INTRAVENOUS | Status: AC
  Administered 2012-10-12: 1000 mL via INTRAVENOUS

## 2012-10-12 MED ORDER — HYDROCORTISONE 10 MG PO TABS
10.0000 mg | ORAL_TABLET | Freq: Two times a day (BID) | ORAL | Status: DC
  Administered 2012-10-12 – 2012-10-13 (×3): 10 mg via ORAL
  Filled 2012-10-12 (×4): qty 1

## 2012-10-12 MED ORDER — HYDROCOD POLST-CHLORPHEN POLST 10-8 MG/5ML PO LQCR
5.0000 mL | Freq: Two times a day (BID) | ORAL | Status: DC | PRN
  Administered 2012-10-13: 5 mL via ORAL
  Filled 2012-10-12: qty 5

## 2012-10-12 MED ORDER — INSULIN ASPART 100 UNIT/ML ~~LOC~~ SOLN: 0.0000 [IU] | Freq: Every day | SUBCUTANEOUS | Status: DC

## 2012-10-12 MED ORDER — POTASSIUM CHLORIDE 10 MEQ/100ML IV SOLN
10.0000 meq | INTRAVENOUS | Status: AC
  Administered 2012-10-12 (×2): 10 meq via INTRAVENOUS
  Filled 2012-10-12: qty 200

## 2012-10-12 MED ORDER — INSULIN GLARGINE 100 UNIT/ML ~~LOC~~ SOLN
10.0000 [IU] | Freq: Once | SUBCUTANEOUS | Status: AC
  Administered 2012-10-12: 10 [IU] via SUBCUTANEOUS

## 2012-10-12 MED ORDER — INSULIN ASPART 100 UNIT/ML ~~LOC~~ SOLN
0.0000 [IU] | Freq: Three times a day (TID) | SUBCUTANEOUS | Status: DC
  Administered 2012-10-13: 3 [IU] via SUBCUTANEOUS
  Administered 2012-10-13: 5 [IU] via SUBCUTANEOUS

## 2012-10-12 NOTE — Progress Notes (Signed)
Inpatient Diabetes Program Recommendations  AACE/ADA: New Consensus Statement on Inpatient Glycemic Control (2013)  Target Ranges:  Prepandial:   less than 140 mg/dL      Peak postprandial:   less than 180 mg/dL (1-2 hours)      Critically ill patients:  140 - 180 mg/dL   Reason for Visit: Consult - Uncontrolled DM  32 year old with h/o DM1 with multiple episodes of DKA in the past (most recently in Jan of this year) who presents to the ED after her friend called EMS because of AMS, and persistent N/V at home. Apparently the patient lost her job and so became very depressed, has not checked blood sugars since Tuesday. She also wasn't eating for the past 3 days, or taking much in the way of insulin she states. Unfortunately, she began to have N/V and ultimately AMS at home. Pt states she needs support, interested in support group for Type 1 DM.  Does not feel like her family understands, and states mother is very critical when it comes to her blood sugar control at home.  Also interested in OmniPod insulin pump when she obtains insurance coverage again.  Results for RADLEY, TESTON (MRN [id number]) as of 10/12/2012 19:29  Ref. Range 08/24/2012 10:10 10/11/2012 13:00  Hemoglobin A1C Latest Range: <5.7 % 14.2 (H) >20.0 (H)    Results for ADRIEANA, FENNELLY (MRN [id number]) as of 10/12/2012 19:29  Ref. Range 10/12/2012 18:01  Sodium Latest Range: 135-145 mEq/L 136  Potassium Latest Range: 3.5-5.1 mEq/L 3.9  Chloride Latest Range: 96-112 mEq/L 106  CO2 Latest Range: 19-32 mEq/L 21  BUN Latest Range: 6-23 mg/dL 7  Creatinine Latest Range: 0.50-1.10 mg/dL 7.82  Calcium Latest Range: 8.4-10.5 mg/dL 8.4  GFR calc non Af Amer Latest Range: >90 mL/min >90  GFR calc Af Amer Latest Range: >90 mL/min >90  Glucose Latest Range: 70-99 mg/dL 956 (H)    Inpatient Diabetes Program Recommendations Insulin - Basal: Recommend Lantus 10 units bid Correction (SSI): Novolog sensitive tidwc and hs Insulin - Meal Coverage:  Novolog 4 units tidwc for meal coverage insulin Outpatient Referral: OP Diabetes Education consult  Poor glycemic control and pt seems very depressed.  Knows what to do but lacks motivation to take care of self.  Ready for transition to SQ insulin. Needs psych consult.  To get info on OmniPod insulin pump for pt in am.  She states she would like for me to speak to Mom about being supportive rather than negative in terms of her diabetes management.    Will f/u in am  Thank you. Ailene Ards, RD, LDN, CDE Inpatient Diabetes Coordinator [phone]

## 2012-10-12 NOTE — Progress Notes (Signed)
Patient ID: [name], female   DOB: Feb 08, 1981, 32 y.o.   MRN: [id number]  [facility] PROGRESS NOTE  [name] WUJ:[id number] DOB: 08-01-1981 DOA: 10/11/2012 PCP: Gweneth Dimitri, MD  Brief narrative: Pt is 32 y.o. female h/o DM1 with multiple episodes of DKA in the past (most recently in Jan of this year) who presented to the ED after her friend called EMS because of AMS, and persistent N/V at home. Apparently the patient lost her job and became very depressed, hasnt taken her blood glucose since Tuesday. She also wasn't eating for the past 3 days.   In the ED patient was found to have DKA with bicarb of 8 , while she does have a WBC of 29k, no obvious source of infection nor symptoms of infection are noted. Thankfully the patients AMS has improved since arrival with volume resuscitation, hospitalist has been asked to admit.   Principal Problem:   DKA (diabetic ketoacidoses) - in the setting of medical non compliance - this was discussed in detail with pt at bedside - will continue supportive measures for now as bicarb level is still ~15 - give additional bolus of NS 1L, keep on insulin drip for now - BMP Q 2 hours until gap closes Active Problems:   Cough - sounds wet and rales on exam noted - will plan on ordering CXR for better evaluation - sputum analysis - empiric ABX Levaquin for now   DM I (diabetes mellitus, type I), uncontrolled - A1C pending but last once checked several months ago > 14 - keep on insulin drip    Addison disease - plan on transitioning to home regimen    Leukocytosis - unclear etiology and possibly secondary to stress and demargination - CXR this AM and empiric ABX for now - WBC is trending down, will check CBC in AM   Depression - will plan on psych consultation once pt more clinically stable    AKI (acute kidney injury) - creatinine and GFR stable and within normal limits this AM - BMP in  AM  Consultants:  None  Procedures/Studies:  None  Antibiotics:  Levaquin 03/10 -->  Code Status: Full Family Communication: Pt and parents at bedside Disposition Plan: Keep in SDU today   HPI/Subjective: No events overnight. Pt reports persistent productive cough of yellow sputum.   Objective: Filed Vitals:   10/12/12 0500 10/12/12 0600 10/12/12 0700 10/12/12 0800  BP: 110/60 110/71 102/64 110/64  Pulse: 95  104 109  Temp: 98.1 F (36.7 C) 97.7 F (36.5 C) 98.6 F (37 C) 99.5 F (37.5 C)  TempSrc:      Resp: 16 14 15 16   Height:      Weight:      SpO2: 100%  100% 100%    Intake/Output Summary (Last 24 hours) at 10/12/12 0953 Last data filed at 10/12/12 0700  Gross per 24 hour  Intake 2539.5 ml  Output   1300 ml  Net 1239.5 ml    Exam:   General:  Pt is alert, follows commands appropriately, not in acute distress  Cardiovascular: Regular rhythm, tachycardic, S1/S2, no murmurs, no rubs, no gallops  Respiratory: Clear to auscultation bilaterally, bibasilar rales   Abdomen: Soft, non tender, non distended, bowel sounds present, no guarding  Extremities: No edema, pulses DP and PT palpable bilaterally  Neuro: Grossly nonfocal  Data Reviewed: Basic Metabolic Panel:  Recent Labs Lab 10/11/12 1300 10/11/12 1725 10/11/12 2121 10/12/12 0155 10/12/12 0550  NA 132* 132* 130* 130* 132*  K 4.2 4.4 4.4 3.4* 4.9  CL 98 100 98 97 99  CO2 15* 17* 17* 18* 15*  GLUCOSE 202* 148* 139* 191* 203*  BUN 19 18 17 14 12   CREATININE 0.87 0.73 0.68 0.65 0.60  CALCIUM 8.5 8.4 8.4 8.5 8.4   CBC:  Recent Labs Lab 10/11/12 0339 10/12/12 0550  WBC 29.2* 22.3*  NEUTROABS 22.8*  --   HGB 17.4* 13.3  HCT 49.3* 37.0  MCV 92.5 87.5  PLT 400 305   CBG:  Recent Labs Lab 10/12/12 0407 10/12/12 0504 10/12/12 0608 10/12/12 0704 10/12/12 0807  GLUCAP 127* 137* 188* 251* 268*    Recent Results (from the past 240 hour(s))  MRSA PCR SCREENING     Status:  None   Collection Time    10/11/12  9:16 AM      Result Value Range Status   MRSA by PCR NEGATIVE  NEGATIVE Final   Comment:            The GeneXpert MRSA Assay (FDA     approved for NASAL specimens     only), is one component of a     comprehensive MRSA colonization     surveillance program. It is not     intended to diagnose MRSA     infection nor to guide or     monitor treatment for     MRSA infections.     Scheduled Meds: . citalopram  20 mg Oral Daily  . heparin  5,000 Units Subcutaneous Q8H  . SOLU-CORTEF inj  50 mg Intravenous Q12H  . levofloxacin IV  500 mg Intravenous Q24H  . norethindrone  1 tablet Oral Daily  . rOPINIRole  0.25 mg Oral QHS   Continuous Infusions: . sodium chloride Stopped (10/11/12 0846)  . dextrose 5 % and 0.45% NaCl 100 mL/hr at 10/12/12 0400  . insulin (NOVOLIN-R) infusion 2.7 Units/hr (10/12/12 0350)     Debbora Presto, MD  [facility] Pager [phone]  If 7PM-7AM, please contact night-coverage [email or web address] Password [email or web address] 10/12/2012, 9:53 AM   LOS: 1 day

## 2012-10-12 NOTE — Progress Notes (Signed)
INITIAL NUTRITION ASSESSMENT  DOCUMENTATION CODES Per approved criteria  -Not Applicable   INTERVENTION: Provide Diabetes Support group information Recommend consult to diabetes coordinator  NUTRITION DIAGNOSIS: Self monitoring deficit related to lack of support as evidenced by DKA and pt's report of lack of social support and health insurance.   Goal: Blood glucose WNL  Monitor:  Blood Glucose   Reason for Assessment: MST  32 y.o. female  Admitting Dx: DKA (diabetic ketoacidoses)  ASSESSMENT: 32 y.o. female h/o DM1 with multiple episodes of DKA in the past (most recently in Jan of this year) who presented to the ED after her friend called EMS because of AMS, and persistent N/V at home. Apparently the patient lost her job and became very depressed, hasnt taken her blood glucose since Tuesday. She also wasn't eating for the past 3 days.  Pt reports that her appetite is good and she is tolerating clear liquids. Pt reports her usual body weight is around 107 lbs. Pt states that she lost health insurance when she lost her job.  Pt reports that she knows what to eat and how to administer insulin. Pt states that the greatest challenge in controlling her diabetes is having insurance to cover equipment costs, remembering to check her blood glucose and lack of social support. Pt does not know anyone else with diabetes and feels she would benefit from support from other people with diabetes.   Height: Ht Readings from Last 1 Encounters:  10/11/12 5\' 2"  (1.575 m)    Weight: Wt Readings from Last 1 Encounters:  10/12/12 107 lb 12.9 oz (48.9 kg)    Ideal Body Weight: 110 lb  % Ideal Body Weight: 97%  Wt Readings from Last 10 Encounters:  10/12/12 107 lb 12.9 oz (48.9 kg)  08/26/12 121 lb 4.1 oz (55 kg)  06/15/12 110 lb 6.4 oz (50.077 kg)  02/13/12 104 lb 0.9 oz (47.2 kg)  01/08/12 110 lb (49.896 kg)  09/02/11 116 lb 6.4 oz (52.799 kg)    Usual Body Weight: 107 lb  % Usual  Body Weight: 100%  BMI:  Body mass index is 19.71 kg/(m^2).  Estimated Nutritional Needs: Kcal: 1500-1750 Protein: 39-49 grams Fluid: 1.9 L  Skin: WDL  Diet Order: Clear Liquid  EDUCATION NEEDS: -No education needs identified at this time   Intake/Output Summary (Last 24 hours) at 10/12/12 1218 Last data filed at 10/12/12 0700  Gross per 24 hour  Intake 2216.1 ml  Output   1100 ml  Net 1116.1 ml    Last BM: PTA  Labs:   Recent Labs Lab 10/12/12 0155 10/12/12 0550 10/12/12 1015  NA 130* 132* 134*  K 3.4* 4.9 3.5  CL 97 99 101  CO2 18* 15* 18*  BUN 14 12 11   CREATININE 0.65 0.60 0.60  CALCIUM 8.5 8.4 8.6  GLUCOSE 191* 203* 135*    CBG (last 3)   Recent Labs  10/12/12 0905 10/12/12 1012 10/12/12 1107  GLUCAP 208* 148* 107*    Scheduled Meds: . citalopram  20 mg Oral Daily  . heparin  5,000 Units Subcutaneous Q8H  . hydrocortisone  10 mg Oral BID  . levofloxacin (LEVAQUIN) IV  500 mg Intravenous Q24H  . norethindrone  1 tablet Oral Daily  . rOPINIRole  0.25 mg Oral QHS    Continuous Infusions: . sodium chloride Stopped (10/11/12 0846)  . dextrose 5 % and 0.45% NaCl 100 mL/hr at 10/12/12 0400  . insulin (NOVOLIN-R) infusion 2.7 Units/hr (10/12/12 0350)  Past Medical History  Diagnosis Date  . Diabetes mellitus   . Addison disease   . Allergy   . Addison disease     Past Surgical History  Procedure Laterality Date  . Wisdom tooth extraction      Ian Malkin RD, LDN Inpatient Clinical Dietitian Pager: [phone] After Hours Pager: [phone]

## 2012-10-12 NOTE — Progress Notes (Signed)
Multiple updates to Deanna Birk, NP regarding pt abnormal lab values, new orders received and initiated.  Pt continues to require insulin drip. Conley Rolls, RN

## 2012-10-13 LAB — CBC
MCH: 31.5 pg (ref 26.0–34.0)
MCHC: 35.6 g/dL (ref 30.0–36.0)
MCV: 88.4 fL (ref 78.0–100.0)
Platelets: 221 10*3/uL (ref 150–400)
RBC: 3.62 MIL/uL — ABNORMAL LOW (ref 3.87–5.11)

## 2012-10-13 LAB — BASIC METABOLIC PANEL
BUN: 9 mg/dL (ref 6–23)
CO2: 18 mEq/L — ABNORMAL LOW (ref 19–32)
Calcium: 7.8 mg/dL — ABNORMAL LOW (ref 8.4–10.5)
Glucose, Bld: 304 mg/dL — ABNORMAL HIGH (ref 70–99)
Sodium: 133 mEq/L — ABNORMAL LOW (ref 135–145)

## 2012-10-13 LAB — GLUCOSE, CAPILLARY
Glucose-Capillary: 199 mg/dL — ABNORMAL HIGH (ref 70–99)
Glucose-Capillary: 284 mg/dL — ABNORMAL HIGH (ref 70–99)

## 2012-10-13 LAB — HIV ANTIBODY (ROUTINE TESTING W REFLEX): HIV: NONREACTIVE

## 2012-10-13 MED ORDER — INSULIN GLARGINE 100 UNIT/ML ~~LOC~~ SOLN: 20.0000 [IU] | Freq: Every day | SUBCUTANEOUS | Status: DC

## 2012-10-13 MED ORDER — INSULIN ASPART 100 UNIT/ML ~~LOC~~ SOLN
4.0000 [IU] | Freq: Three times a day (TID) | SUBCUTANEOUS | Status: DC
  Administered 2012-10-13: 4 [IU] via SUBCUTANEOUS

## 2012-10-13 MED ORDER — INSULIN GLARGINE 100 UNIT/ML ~~LOC~~ SOLN
15.0000 [IU] | Freq: Once | SUBCUTANEOUS | Status: AC
  Administered 2012-10-13: 15 [IU] via SUBCUTANEOUS

## 2012-10-13 NOTE — Progress Notes (Signed)
Patient recently lost her job and is worried about the hospital bill; [name] Financial advisor talked with the patient the hospital bill; CM gave patient information about how to apply for insurance; Patient is in the process of looking for another job; lots of emotional support given; B [employer]

## 2012-10-13 NOTE — Progress Notes (Addendum)
Inpatient Diabetes Program Recommendations  AACE/ADA: New Consensus Statement on Inpatient Glycemic Control (2013)  Target Ranges:  Prepandial:   less than 140 mg/dL      Peak postprandial:   less than 180 mg/dL (1-2 hours)      Critically ill patients:  140 - 180 mg/dL   Reason for Visit: Diabetes Consult   Anion Gap 12 this am (up from 10 on 10/13/2012 at 1939.)  Pt transferred to floor this morning.  Sitting up in bed with Mom in room.  States she ate dinner last night and breakfast this am.  Gave pt info regarding OmniPod insulin pump and encouraged her to talk with PCP regarding restarting the pump when her insurance will cover.  Stressed importance of checking blood sugars and taking insulin as directed.  Gave info on support group at [facility] and websites for additional support.  Results for Deanna, [name] (MRN [id number]) as of 10/13/2012 11:20  Ref. Range 10/13/2012 03:40  Sodium Latest Range: 135-145 mEq/L 133 (L)  Potassium Latest Range: 3.5-5.1 mEq/L 4.0  Chloride Latest Range: 96-112 mEq/L 103  CO2 Latest Range: 19-32 mEq/L 18 (L)  BUN Latest Range: 6-23 mg/dL 9  Creatinine Latest Range: 0.50-1.10 mg/dL 4.09  Calcium Latest Range: 8.4-10.5 mg/dL 7.8 (L)  GFR calc non Af Amer Latest Range: >90 mL/min >90  GFR calc Af Amer Latest Range: >90 mL/min >90  Glucose Latest Range: 70-99 mg/dL 811 (H)  Results for Deanna, [name] (MRN [id number]) as of 10/13/2012 11:20  Ref. Range 10/12/2012 18:45 10/12/2012 19:23 10/12/2012 20:36 10/12/2012 21:48 10/13/2012 07:41  Glucose-Capillary Latest Range: 70-99 mg/dL 213 (H) 086 (H) 578 (H) 109 (H) 291 (H)   Results for Deanna, [name] (MRN [id number]) as of 10/13/2012 11:20  Ref. Range 10/11/2012 13:00  Hemoglobin A1C Latest Range: <5.7 % >20.0 (H)   Pt needs basal and meal coverage insulin! Please add Lantus 15 units QAM (1 time order) and then Lantus 20 units QAM to start 3/12. Please add meal coverage insulin - Novolog 4  units tidwc and uptitrate as needed. (Pt on 5 units tidwc at home) Change diet to CHO mod medium.  Will order OP Diabetes Education consult for uncontrolled DM1.  Will continue to follow.  Discussed with RN.  Thank you. Ailene Ards, RD, LDN, CDE Inpatient Diabetes Coordinator [phone]

## 2012-10-13 NOTE — Discharge Summary (Signed)
Physician Discharge Summary  [name] WUJ:[id number] DOB: 02-14-1981 DOA: 10/11/2012  PCP: Gweneth Dimitri, MD  Admit date: 10/11/2012 Discharge date: 10/13/2012  Recommendations for Outpatient Follow-up:  1. Pt will need to follow up with PCP in 2-3 weeks post discharge 2. Please obtain BMP to evaluate electrolytes and kidney function 3. Please also check CBC to evaluate Hg and Hct levels 4. Follow up with endocrinologist recommneded  Discharge Diagnoses: DKA secondary to medical non compliance  Principal Problem:   DKA (diabetic ketoacidoses) Active Problems:   DM I (diabetes mellitus, type I), uncontrolled   Addison disease   Leukocytosis   Depression   AKI (acute kidney injury)   Discharge Condition: Stable  Diet recommendation: Heart healthy diet discussed in details   Brief narrative:  Pt is 32 y.o. female h/o DM1 with multiple episodes of DKA in the past (most recently in Jan of this year) who presented to the ED after her friend called EMS because of AMS, and persistent N/V at home. Apparently the patient lost her job and became very depressed, hasnt taken her blood glucose since Tuesday. She also wasn't eating for the past 3 days.   In the ED patient was found to have DKA with bicarb of 8 , while she does have a WBC of 29k, no obvious source of infection nor symptoms of infection are noted. Thankfully the patients AMS has improved since arrival with volume resuscitation, hospitalist has been asked to admit.   Principal Problem:  DKA (diabetic ketoacidoses)  - in the setting of medical non compliance  - this was discussed in detail with pt at bedside  - will continue supportive measures provided and pt has done well - gap closed and electrolytes supplemented Active Problems:  Cough  - still coughing - sputum analysis negative to date, leukocytosis resolved - empiric ABX Levaquin upon discharge for 5 more days  DM I (diabetes mellitus, type I), uncontrolled  - A1C   several months ago > 14  - continue home medical regimen  Addison disease  - plan on transitioning to home regimen  Leukocytosis  - unclear etiology and possibly secondary to stress and demargination  - CXR unremarkable, WBC within normal limits  - WBC is trending down, will check CBC in AM  Depression  - psych consultation outpatient recommended  AKI (acute kidney injury)  - creatinine and GFR stable and within normal limits this AM   Consultants:  None Procedures/Studies:  None Antibiotics:  Levaquin 03/10 --> Code Status: Full  Family Communication: Pt and parents at bedside   Discharge Exam: Filed Vitals:   10/13/12 0800  BP:   Pulse:   Temp: 98.6 F (37 C)  Resp:    Filed Vitals:   10/13/12 0000 10/13/12 0126 10/13/12 0753 10/13/12 0800  BP:  112/74 116/71   Pulse: 85 87    Temp: 99.6 F (37.6 C)   98.6 F (37 C)  TempSrc: Oral   Oral  Resp: 16 21 18    Height:      Weight:      SpO2: 100% 100% 99%     General: Pt is alert, follows commands appropriately, not in acute distress Cardiovascular: Regular rate and rhythm, S1/S2 +, no murmurs, no rubs, no gallops Respiratory: Clear to auscultation bilaterally, no wheezing, no crackles, no rhonchi Abdominal: Soft, non tender, non distended, bowel sounds +, no guarding Extremities: no edema, no cyanosis, pulses palpable bilaterally DP and PT Neuro: Grossly nonfocal  Discharge Instructions  Discharge Orders  Future Orders Complete By Expires     Ambulatory referral to Nutrition and Diabetic Education  As directed     Diet - low sodium heart healthy  As directed     Increase activity slowly  As directed         Medication List    TAKE these medications       citalopram 20 MG tablet  Commonly known as:  CELEXA  Take 1 tablet (20 mg total) by mouth daily.     hydrocortisone 10 MG tablet  Commonly known as:  CORTEF  Take 10 mg by mouth 2 (two) times daily.     insulin aspart 100 UNIT/ML injection   Commonly known as:  novoLOG  Inject 1 Units into the skin 3 (three) times daily before meals. Sliding scale but patient cannot tell me the specifics.     norethindrone 0.35 MG tablet  Commonly known as:  ERRIN  Take 1 tablet (0.35 mg total) by mouth daily.     rOPINIRole 0.25 MG tablet  Commonly known as:  REQUIP  Take 0.25 mg by mouth at bedtime.     valACYclovir 1000 MG tablet  Commonly known as:  VALTREX  Take 1 tablet (1,000 mg total) by mouth 2 (two) times daily.           Follow-up Information   Follow up with [facility], [place] In 2 weeks.   Contact information:   [address] [place] [address] [phone]       Follow up with [facility] On 11/10/2012. (Appointment at 8:30 am)    Contact information:   [address] [place] [address] [phone]       The results of significant diagnostics from this hospitalization (including imaging, microbiology, ancillary and laboratory) are listed below for reference.     Microbiology: Recent Results (from the past 240 hour(s))  MRSA PCR SCREENING     Status: None   Collection Time    10/11/12  9:16 AM      Result Value Range Status   MRSA by PCR NEGATIVE  NEGATIVE Final   Comment:            The GeneXpert MRSA Assay (FDA     approved for NASAL specimens     only), is one component of a     comprehensive MRSA colonization     surveillance program. It is not     intended to diagnose MRSA     infection nor to guide or     monitor treatment for     MRSA infections.  CULTURE, BLOOD (ROUTINE X 2)     Status: None   Collection Time    10/12/12 10:15 AM      Result Value Range Status   Specimen Description BLOOD LEFT WRIST   Final   Special Requests BOTTLES DRAWN AEROBIC AND ANAEROBIC 5CC   Final   Culture  Setup Time 10/12/2012 18:02   Final   Culture     Final   Value:        BLOOD CULTURE RECEIVED NO GROWTH TO DATE CULTURE WILL BE HELD FOR 5 DAYS BEFORE  ISSUING A FINAL NEGATIVE REPORT   Report Status PENDING   Incomplete  CULTURE, BLOOD (ROUTINE X 2)     Status: None   Collection Time    10/12/12 10:25 AM      Result Value Range Status   Specimen Description BLOOD RIGHT HAND  Final   Special Requests BOTTLES DRAWN AEROBIC AND ANAEROBIC 5CC   Final   Culture  Setup Time 10/12/2012 18:02   Final   Culture     Final   Value:        BLOOD CULTURE RECEIVED NO GROWTH TO DATE CULTURE WILL BE HELD FOR 5 DAYS BEFORE ISSUING A FINAL NEGATIVE REPORT   Report Status PENDING   Incomplete     Labs: Basic Metabolic Panel:  Recent Labs Lab 10/12/12 1347 10/12/12 1548 10/12/12 1801 10/12/12 1939 10/13/12 0340  NA 129* 134* 136 135 133*  K 6.3* 3.6 3.9 3.5 4.0  CL 102 104 106 106 103  CO2 15* 18* 21 19 18*  GLUCOSE 165* 189* 113* 224* 304*  BUN 8 8 7 8 9   CREATININE 0.51 0.54 0.55 0.67 0.56  CALCIUM 7.4* 8.3* 8.4 7.8* 7.8*   CBC:  Recent Labs Lab 10/11/12 0339 10/12/12 0550 10/13/12 0340  WBC 29.2* 22.3* 9.9  NEUTROABS 22.8*  --   --   HGB 17.4* 13.3 11.4*  HCT 49.3* 37.0 32.0*  MCV 92.5 87.5 88.4  PLT 400 305 221   CBG:  Recent Labs Lab 10/12/12 1923 10/12/12 2036 10/12/12 2148 10/13/12 0741 10/13/12 1154  GLUCAP 199* 163* 109* 291* 284*     SIGNED: Time coordinating discharge: Over 30 minutes  Debbora Presto, MD  [facility] 10/13/2012, 1:19 PM Pager [phone]  If 7PM-7AM, please contact night-coverage [email or web address] Password [email or web address]

## 2012-10-17 ENCOUNTER — Encounter (HOSPITAL_COMMUNITY): Payer: Self-pay | Admitting: Emergency Medicine

## 2012-10-17 ENCOUNTER — Inpatient Hospital Stay (HOSPITAL_COMMUNITY)
Admission: EM | Admit: 2012-10-17 | Discharge: 2012-10-20 | DRG: 637 | Disposition: A | Discharge status: Home / Self Care | Payer: Managed Care, Other (non HMO) | Attending: Internal Medicine | Admitting: Internal Medicine

## 2012-10-17 DIAGNOSIS — R7309 Other abnormal glucose: Secondary | ICD-10-CM | POA: Diagnosis present

## 2012-10-17 DIAGNOSIS — Z79899 Other long term (current) drug therapy: Secondary | ICD-10-CM

## 2012-10-17 DIAGNOSIS — T68XXXA Hypothermia, initial encounter: Secondary | ICD-10-CM

## 2012-10-17 DIAGNOSIS — B3749 Other urogenital candidiasis: Secondary | ICD-10-CM | POA: Diagnosis present

## 2012-10-17 DIAGNOSIS — F329 Major depressive disorder, single episode, unspecified: Secondary | ICD-10-CM | POA: Diagnosis present

## 2012-10-17 DIAGNOSIS — G9340 Encephalopathy, unspecified: Secondary | ICD-10-CM | POA: Diagnosis present

## 2012-10-17 DIAGNOSIS — Y92009 Unspecified place in unspecified non-institutional (private) residence as the place of occurrence of the external cause: Secondary | ICD-10-CM

## 2012-10-17 DIAGNOSIS — F32A Depression, unspecified: Secondary | ICD-10-CM | POA: Diagnosis present

## 2012-10-17 DIAGNOSIS — Z794 Long term (current) use of insulin: Secondary | ICD-10-CM

## 2012-10-17 DIAGNOSIS — T380X5A Adverse effect of glucocorticoids and synthetic analogues, initial encounter: Secondary | ICD-10-CM | POA: Diagnosis present

## 2012-10-17 DIAGNOSIS — E2749 Other adrenocortical insufficiency: Secondary | ICD-10-CM | POA: Diagnosis present

## 2012-10-17 DIAGNOSIS — N390 Urinary tract infection, site not specified: Secondary | ICD-10-CM | POA: Diagnosis present

## 2012-10-17 DIAGNOSIS — Z881 Allergy status to other antibiotic agents status: Secondary | ICD-10-CM

## 2012-10-17 DIAGNOSIS — Z23 Encounter for immunization: Secondary | ICD-10-CM

## 2012-10-17 DIAGNOSIS — E1065 Type 1 diabetes mellitus with hyperglycemia: Secondary | ICD-10-CM | POA: Diagnosis present

## 2012-10-17 DIAGNOSIS — Z91199 Patient's noncompliance with other medical treatment and regimen due to unspecified reason: Secondary | ICD-10-CM

## 2012-10-17 DIAGNOSIS — D72829 Elevated white blood cell count, unspecified: Secondary | ICD-10-CM | POA: Diagnosis present

## 2012-10-17 DIAGNOSIS — G2581 Restless legs syndrome: Secondary | ICD-10-CM | POA: Diagnosis present

## 2012-10-17 DIAGNOSIS — Z9119 Patient's noncompliance with other medical treatment and regimen: Secondary | ICD-10-CM

## 2012-10-17 DIAGNOSIS — E271 Primary adrenocortical insufficiency: Secondary | ICD-10-CM

## 2012-10-17 DIAGNOSIS — R680 Hypothermia, not associated with low environmental temperature: Secondary | ICD-10-CM | POA: Diagnosis present

## 2012-10-17 DIAGNOSIS — E162 Hypoglycemia, unspecified: Secondary | ICD-10-CM

## 2012-10-17 DIAGNOSIS — IMO0002 Reserved for concepts with insufficient information to code with codable children: Secondary | ICD-10-CM | POA: Diagnosis present

## 2012-10-17 DIAGNOSIS — F3289 Other specified depressive episodes: Secondary | ICD-10-CM | POA: Diagnosis present

## 2012-10-17 LAB — CBC WITH DIFFERENTIAL/PLATELET
Basophils Absolute: 0 10*3/uL (ref 0.0–0.1)
Lymphocytes Relative: 17 % (ref 12–46)
Lymphs Abs: 2.9 10*3/uL (ref 0.7–4.0)
MCV: 91.6 fL (ref 78.0–100.0)
Neutro Abs: 13.3 10*3/uL — ABNORMAL HIGH (ref 1.7–7.7)
Neutrophils Relative %: 77 % (ref 43–77)
Platelets: 318 10*3/uL (ref 150–400)
RBC: 4.52 MIL/uL (ref 3.87–5.11)
RDW: 12.1 % (ref 11.5–15.5)
WBC: 17.3 10*3/uL — ABNORMAL HIGH (ref 4.0–10.5)

## 2012-10-17 LAB — GLUCOSE, CAPILLARY
Glucose-Capillary: 119 mg/dL — ABNORMAL HIGH (ref 70–99)
Glucose-Capillary: 151 mg/dL — ABNORMAL HIGH (ref 70–99)
Glucose-Capillary: 78 mg/dL (ref 70–99)

## 2012-10-17 LAB — URINALYSIS, ROUTINE W REFLEX MICROSCOPIC
Glucose, UA: >1000 mg/dL — AB
Nitrite: NEGATIVE
Protein, ur: NEGATIVE mg/dL

## 2012-10-17 LAB — URINE MICROSCOPIC-ADD ON

## 2012-10-17 LAB — BASIC METABOLIC PANEL
CO2: 26 mEq/L (ref 19–32)
Calcium: 8.7 mg/dL (ref 8.4–10.5)
GFR calc Af Amer: >90 mL/min (ref >90)
GFR calc non Af Amer: >90 mL/min (ref >90)
Sodium: 136 mEq/L (ref 135–145)

## 2012-10-17 LAB — POCT PREGNANCY, URINE: Preg Test, Ur: NEGATIVE

## 2012-10-17 MED ORDER — ONDANSETRON HCL 4 MG/2ML IJ SOLN
4.0000 mg | Freq: Once | INTRAMUSCULAR | Status: AC
  Administered 2012-10-17: 4 mg via INTRAVENOUS
  Filled 2012-10-17: qty 2

## 2012-10-17 MED ORDER — PROMETHAZINE HCL 25 MG PO TABS: 12.5000 mg | ORAL_TABLET | Freq: Four times a day (QID) | ORAL | Status: DC | PRN

## 2012-10-17 MED ORDER — ROPINIROLE HCL 0.25 MG PO TABS
0.2500 mg | ORAL_TABLET | Freq: Every day | ORAL | Status: DC
  Administered 2012-10-18 – 2012-10-19 (×3): 0.25 mg via ORAL
  Filled 2012-10-17 (×4): qty 1

## 2012-10-17 MED ORDER — GLUCAGON HCL (RDNA) 1 MG IJ SOLR: 1.0000 mg | Freq: Once | INTRAMUSCULAR | Status: AC | PRN

## 2012-10-17 MED ORDER — ACETAMINOPHEN 650 MG RE SUPP: 650.0000 mg | Freq: Four times a day (QID) | RECTAL | Status: DC | PRN

## 2012-10-17 MED ORDER — DEXTROSE-NACL 5-0.45 % IV SOLN
INTRAVENOUS | Status: DC
  Administered 2012-10-18: 02:00:00 via INTRAVENOUS

## 2012-10-17 MED ORDER — ACETAMINOPHEN 325 MG PO TABS
650.0000 mg | ORAL_TABLET | Freq: Four times a day (QID) | ORAL | Status: DC | PRN
  Administered 2012-10-18: 650 mg via ORAL
  Filled 2012-10-17: qty 2

## 2012-10-17 MED ORDER — DEXTROSE 50 % IV SOLN: 25.0000 mL | Freq: Once | INTRAVENOUS | Status: AC | PRN

## 2012-10-17 MED ORDER — NORETHINDRONE 0.35 MG PO TABS: 1.0000 | ORAL_TABLET | Freq: Every day | ORAL | Status: DC

## 2012-10-17 MED ORDER — GLUCAGON HCL (RDNA) 1 MG IJ SOLR: 0.5000 mg | Freq: Once | INTRAMUSCULAR | Status: AC | PRN

## 2012-10-17 MED ORDER — DEXTROSE 50 % IV SOLN: 50.0000 mL | Freq: Once | INTRAVENOUS | Status: AC | PRN

## 2012-10-17 MED ORDER — CITALOPRAM HYDROBROMIDE 20 MG PO TABS
20.0000 mg | ORAL_TABLET | Freq: Every day | ORAL | Status: DC
  Administered 2012-10-18 – 2012-10-20 (×3): 20 mg via ORAL
  Filled 2012-10-17 (×3): qty 1

## 2012-10-17 MED ORDER — HYDROCORTISONE 10 MG PO TABS
10.0000 mg | ORAL_TABLET | Freq: Two times a day (BID) | ORAL | Status: DC
  Administered 2012-10-18 – 2012-10-20 (×6): 10 mg via ORAL
  Filled 2012-10-17 (×7): qty 1

## 2012-10-17 MED ORDER — HEPARIN SODIUM (PORCINE) 5000 UNIT/ML IJ SOLN
5000.0000 [IU] | Freq: Three times a day (TID) | INTRAMUSCULAR | Status: DC
  Filled 2012-10-17 (×4): qty 1

## 2012-10-17 MED ORDER — FLUDROCORTISONE ACETATE 0.1 MG PO TABS
0.1000 mg | ORAL_TABLET | Freq: Every day | ORAL | Status: DC
  Administered 2012-10-18 – 2012-10-19 (×2): 0.1 mg via ORAL
  Filled 2012-10-17 (×2): qty 1

## 2012-10-17 NOTE — ED Notes (Signed)
Patient has eaten 1/4 sandwich, a small bite of cheese, and 1 cracker, patient currently drinking diet coke.

## 2012-10-17 NOTE — ED Notes (Signed)
Per EMS. Patient with hx type 1 diabetes, recently seen here for DKA. Patient was last contact yesterday 1230 via text message. Parents called 911 when they were unable to reach her via phone, went to her home, and found her non responsive. Patient initial 30, 1mg  glucagon then 27, 12g D10, now 74. Patient cold to touch, unable to obtain accurate SPO2.

## 2012-10-17 NOTE — H&P (Addendum)
Hospitalist Admission History and Physical  Patient name: [name] Medical record number: [id number] Date of birth: March 14, 1981 Age: 32 y.o. Gender: female  Primary Care Provider: Gweneth Dimitri, MD  Chief Complaint: Hypothermia, hypoglycemia History of Present Illness:This is a 32 y.o. year old female with significant past medical history of type 1 diabetes and recent multiple admissions for DKA presenting with hypothermia hypoglycemia. Patient recently admitted 3/9-3/11 for DKA. Patient reported for followup with her endocrinologist. Patient states that her Lantus dose was increased by one unit. Patient states she took her increased dose last night however ate only a small amount of food. Per report patient became very weak and lethargic. EMS was called and patient was noted to have a blood sugar of around 20. Patient received D50 and was subsequently brought to the ER. In the ER, patient had a temperature of 93.6. Patient was given glucagon as well as IV fluids and bear hugger. Temperature improved to around 100. Blood sugars improved rate and 70 to Patient denies taking any other medications or intentionally missing food. Patient denies any hemiparesis, confusion, shortness of breath, chest pain, nausea, vomiting. No trouble with urination or bowel movements. Baseline history of Addison's disease. Patient states this is been well-controlled. Patient does admit to a very erratic eating regimen.  Patient Active Problem List  Diagnosis  . Nausea and vomiting  . DM I (diabetes mellitus, type I), uncontrolled  . DKA (diabetic ketoacidoses)  . Nausea & vomiting  . Addison disease  . Restless leg  . Encephalopathy acute  . Leukocytosis  . Depression  . AKI (acute kidney injury)   Past Medical History: Past Medical History  Diagnosis Date  . Diabetes mellitus   . Addison disease   . Allergy   . Addison disease     Past Surgical History: Past Surgical History  Procedure Laterality Date   . Wisdom tooth extraction      Social History: History   Social History  . Marital Status: Single    Spouse Name: N/A    Number of Children: N/A  . Years of Education: N/A   Social History Main Topics  . Smoking status: Never Smoker   . Smokeless tobacco: Never Used  . Alcohol Use: 1.2 oz/week    2 Cans of beer per week     Comment: occasionally  . Drug Use: No  . Sexually Active: Yes    Birth Control/ Protection: Condom   Other Topics Concern  . None   Social History Narrative  . None    Family History: Family History  Problem Relation Age of Onset  . Alzheimer's disease Maternal Grandmother   . Diabetes Paternal Grandmother   . Cancer Paternal Grandfather     Allergies: Allergies  Allergen Reactions  . Augmentin (Amoxicillin-Pot Clavulanate) Diarrhea    Current Facility-Administered Medications  Medication Dose Route Frequency Provider Last Rate Last Dose  . acetaminophen (TYLENOL) tablet 650 mg  650 mg Oral Q6H PRN Doree Albee, MD       Or  . acetaminophen (TYLENOL) suppository 650 mg  650 mg Rectal Q6H PRN Doree Albee, MD      . Melene Muller ON 10/18/2012] citalopram (CELEXA) tablet 20 mg  20 mg Oral Daily Doree Albee, MD      . Melene Muller ON 10/18/2012] dextrose 5 %-0.45 % sodium chloride infusion   Intravenous Continuous Doree Albee, MD      . dextrose 50 % solution 25 mL  25 mL Intravenous Once PRN Doree Albee, MD      .  dextrose 50 % solution 50 mL  50 mL Intravenous Once PRN Doree Albee, MD      . Melene Muller ON 10/18/2012] fludrocortisone (FLORINEF) tablet 0.1 mg  0.1 mg Oral Daily Doree Albee, MD      . glucagon [facility]) injection 1 mg  1 mg Intramuscular Once PRN Doree Albee, MD       Or  . glucagon [facility]) injection 0.5 mg  0.5 mg Intramuscular Once PRN Doree Albee, MD      . Melene Muller ON 10/18/2012] heparin injection 5,000 Units  5,000 Units Subcutaneous Q8H Doree Albee, MD      . Melene Muller ON 10/18/2012] hydrocortisone (CORTEF) tablet 10 mg  10  mg Oral BID Doree Albee, MD      . Melene Muller ON 10/18/2012] norethindrone (MICRONOR,CAMILA,ERRIN) 0.35 MG tablet 0.35 mg  1 tablet Oral Daily Doree Albee, MD      . promethazine (PHENERGAN) tablet 12.5 mg  12.5 mg Oral Q6H PRN Doree Albee, MD      . Melene Muller ON 10/18/2012] rOPINIRole (REQUIP) tablet 0.25 mg  0.25 mg Oral QHS Doree Albee, MD       Current Outpatient Prescriptions  Medication Sig Dispense Refill  . citalopram (CELEXA) 20 MG tablet Take 1 tablet (20 mg total) by mouth daily.  30 tablet  5  . fludrocortisone (FLORINEF) 0.1 MG tablet Take 0.1 mg by mouth daily.      . hydrocortisone (CORTEF) 10 MG tablet Take 10 mg by mouth 2 (two) times daily.      . insulin aspart (NOVOLOG) 100 UNIT/ML injection Inject 1 Units into the skin 3 (three) times daily before meals. Sliding scale but patient cannot tell me the specifics.      . norethindrone (ERRIN) 0.35 MG tablet Take 1 tablet (0.35 mg total) by mouth daily.  1 Package  11  . ramipril (ALTACE) 2.5 MG capsule Take 2.5 mg by mouth daily.      Marland Kitchen rOPINIRole (REQUIP) 0.25 MG tablet Take 0.25 mg by mouth at bedtime.        Review Of Systems: 12 point ROS negative except as noted above in HPI.  Physical Exam: Filed Vitals:   10/17/12 2315  BP:   Pulse: 111  Temp: 100.9 F (38.3 C)  Resp: 20    General: alert, cooperative and underweight  HEENT: PERRLA and extra ocular movement intact Heart: S1, S2 normal, no murmur, rub or gallop, regular rate and rhythm Lungs: clear to auscultation, no wheezes or rales and unlabored breathing Abdomen: abdomen is soft without significant tenderness, masses, organomegaly or guarding Extremities: extremities normal, atraumatic, no cyanosis or edema Skin:no rashes Neurology: normal without focal findings, mental status, speech normal, alert and oriented x3 and PERLA  Labs and Imaging: Lab Results  Component Value Date/Time   NA 136 10/17/2012  8:14 PM   K 3.7 10/17/2012  8:14 PM   CL 101  10/17/2012  8:14 PM   CO2 26 10/17/2012  8:14 PM   BUN 9 10/17/2012  8:14 PM   CREATININE 0.43* 10/17/2012  8:14 PM   CREATININE 0.96 06/15/2012  8:19 PM   GLUCOSE 127* 10/17/2012  8:14 PM   Lab Results  Component Value Date   WBC 17.3* 10/17/2012   HGB 14.4 10/17/2012   HCT 41.4 10/17/2012   MCV 91.6 10/17/2012   PLT 318 10/17/2012   Urinalysis    Component Value Date/Time   COLORURINE YELLOW 10/17/2012 2159   APPEARANCEUR TURBID* 10/17/2012 2159   LABSPEC 1.022  10/17/2012 2159   PHURINE 5.5 10/17/2012 2159   GLUCOSEU >1000* 10/17/2012 2159   HGBUR MODERATE* 10/17/2012 2159   BILIRUBINUR NEGATIVE 10/17/2012 2159   BILIRUBINUR neg 06/15/2012 2036   KETONESUR NEGATIVE 10/17/2012 2159   PROTEINUR NEGATIVE 10/17/2012 2159   UROBILINOGEN 0.2 10/17/2012 2159   UROBILINOGEN 0.2 06/15/2012 2036   NITRITE NEGATIVE 10/17/2012 2159   NITRITE neg 06/15/2012 2036   LEUKOCYTESUR SMALL* 10/17/2012 2159    Assessment and Plan: [name] is a 32 y.o. year old female presenting with hypothermia and hypoglycemia.  Hypothermia: Multifactorial with most likely cause being marked hypoglycemia. Now resolved after IV fluids glucagon and bear hugger. Clinically without any overt signs of infection although urinalysis is questionable for UTI. Will start patient on Rocephin for coverage pending urine culture.  Acute encephalopathy: Likely secondary to hypoglycemia. Now resolved. We'll continue clinically follow.  Hypoglycemia: Patient noted to be poorly compliant in the past. Noted A1c of greater than 11 per patient report. Continue IV fluids with D5 normal saline. CBG monitoring. We'll hold on basal long-acting. Nutrition consult.  Leukocytosis: Trending down from last clinical visit at 17.9 today. Suspect that chronic Cortef use may be contributing to this in conjunction with acute demargination. Will place patient on Rocephin for urinary coverage given urinalysis findings pending urine culture.   Addison's:  Pressure is currently stable status post resolution of hypoglycemia. Sodium within normal limits. Continue hydrocortisone at home dose. Consider stress dosing if patient becomes hypotensive.  Psych: Continue home Celexa dose. Suspect that mood that medication being too medical noncompliance. Patient may benefit from psych consult in house. Will defer to rounding team.  FEN/GI: Carb modified diet. Antiemetics prn.  Prophylaxis: subq heparin Disposition: pending further evaluation  Code Status:full code         Doree Albee MD  Pager: [phone]

## 2012-10-17 NOTE — ED Provider Notes (Addendum)
History     CSN: [id number]  Arrival date & time 10/17/12  2031   First MD Initiated Contact with Patient 10/17/12 2103      Chief Complaint  Patient presents with  . Hypoglycemia    (Consider location/radiation/quality/duration/timing/severity/associated sxs/prior treatment) The history is provided by the patient and a parent.   patient here after being found unresponsive by her parents. Patient recently had her dose of insulin changed and she did not eat afterwards.[facility] and blood sugar was 20 and patient given glucagon as well as D50 and transported here. States that she has not had any fever, vomiting, diarrhea. Denies any recent chest pain or abdominal pain. No cough or shortness of breath. Feels better at this time  Past Medical History  Diagnosis Date  . Diabetes mellitus   . Addison disease   . Allergy   . Addison disease     Past Surgical History  Procedure Laterality Date  . Wisdom tooth extraction      Family History  Problem Relation Age of Onset  . Alzheimer's disease Maternal Grandmother   . Diabetes Paternal Grandmother   . Cancer Paternal Grandfather     History  Substance Use Topics  . Smoking status: Never Smoker   . Smokeless tobacco: Never Used  . Alcohol Use: 1.2 oz/week    2 Cans of beer per week     Comment: occasionally    OB History   Grav Para Term Preterm Abortions TAB SAB Ect Mult Living                  Review of Systems  All other systems reviewed and are negative.    Allergies  Augmentin  Home Medications   Current Outpatient Rx  Name  Route  Sig  Dispense  Refill  . citalopram (CELEXA) 20 MG tablet   Oral   Take 1 tablet (20 mg total) by mouth daily.   30 tablet   5   . fludrocortisone (FLORINEF) 0.1 MG tablet   Oral   Take 0.1 mg by mouth daily.         . hydrocortisone (CORTEF) 10 MG tablet   Oral   Take 10 mg by mouth 2 (two) times daily.         . insulin aspart (NOVOLOG) 100 UNIT/ML  injection   Subcutaneous   Inject 1 Units into the skin 3 (three) times daily before meals. Sliding scale but patient cannot tell me the specifics.         . norethindrone (ERRIN) 0.35 MG tablet   Oral   Take 1 tablet (0.35 mg total) by mouth daily.   1 Package   11   . ramipril (ALTACE) 2.5 MG capsule   Oral   Take 2.5 mg by mouth daily.         Marland Kitchen rOPINIRole (REQUIP) 0.25 MG tablet   Oral   Take 0.25 mg by mouth at bedtime.            BP 130/94  Pulse 97  Temp(Src) 93.6 F (34.2 C) (Rectal)  Resp 18  SpO2 100%  LMP 09/26/2012  Physical Exam  Nursing note and vitals reviewed. Constitutional: She is oriented to person, place, and time. She appears well-developed and well-nourished.  Non-toxic appearance. No distress.  HENT:  Head: Normocephalic and atraumatic.  Eyes: Conjunctivae, EOM and lids are normal. Pupils are equal, round, and reactive to light.  Neck: Normal range of motion. Neck supple.  No tracheal deviation present. No mass present.  Cardiovascular: Normal rate, regular rhythm and normal heart sounds.  Exam reveals no gallop.   No murmur heard. Pulmonary/Chest: Effort normal and breath sounds normal. No stridor. No respiratory distress. She has no decreased breath sounds. She has no wheezes. She has no rhonchi. She has no rales.  Abdominal: Soft. Normal appearance and bowel sounds are normal. She exhibits no distension. There is no tenderness. There is no rebound and no CVA tenderness.  Musculoskeletal: Normal range of motion. She exhibits no edema and no tenderness.  Neurological: She is alert and oriented to person, place, and time. She has normal strength. No cranial nerve deficit or sensory deficit. GCS eye subscore is 4. GCS verbal subscore is 5. GCS motor subscore is 6.  Skin: Skin is warm and dry. No abrasion and no rash noted.  Psychiatric: She has a normal mood and affect. Her speech is normal and behavior is normal.    ED Course  Procedures  (including critical care time)  Labs Reviewed  GLUCOSE, CAPILLARY  URINALYSIS, ROUTINE W REFLEX MICROSCOPIC  BASIC METABOLIC PANEL  CBC WITH DIFFERENTIAL   No results found.   No diagnosis found.    MDM  Patient placed on external warming device as well as given food to eat. Blood sugar improved. Pt remains hypothermic. Will admit to medicine for observation. Nausea tx with zofran  CRITICAL CARE Performed by: Toy Baker   Total critical care time: 45  Critical care time was exclusive of separately billable procedures and treating other patients.  Critical care was necessary to treat or prevent imminent or life-threatening deterioration.  Critical care was time spent personally by me on the following activities: development of treatment plan with patient and/or surrogate as well as nursing, discussions with consultants, evaluation of patient's response to treatment, examination of patient, obtaining history from patient or surrogate, ordering and performing treatments and interventions, ordering and review of laboratory studies, ordering and review of radiographic studies, pulse oximetry and re-evaluation of patient's condition.      Toy Baker, MD 10/17/12 2249  Toy Baker, MD 10/17/12 [phone]

## 2012-10-17 NOTE — ED Notes (Signed)
ZOX:WRUE<AV> Expected date:<BR> Expected time:<BR> Means of arrival:<BR> Comments:<BR> EMS hypoglycemia

## 2012-10-17 NOTE — ED Notes (Signed)
CBG on arrival 74

## 2012-10-18 ENCOUNTER — Observation Stay (HOSPITAL_COMMUNITY): Payer: Managed Care, Other (non HMO)

## 2012-10-18 DIAGNOSIS — R680 Hypothermia, not associated with low environmental temperature: Secondary | ICD-10-CM | POA: Diagnosis present

## 2012-10-18 DIAGNOSIS — T68XXXA Hypothermia, initial encounter: Secondary | ICD-10-CM

## 2012-10-18 DIAGNOSIS — E2749 Other adrenocortical insufficiency: Secondary | ICD-10-CM

## 2012-10-18 DIAGNOSIS — E162 Hypoglycemia, unspecified: Secondary | ICD-10-CM | POA: Diagnosis present

## 2012-10-18 DIAGNOSIS — R7309 Other abnormal glucose: Secondary | ICD-10-CM | POA: Diagnosis present

## 2012-10-18 DIAGNOSIS — G9340 Encephalopathy, unspecified: Secondary | ICD-10-CM

## 2012-10-18 DIAGNOSIS — N390 Urinary tract infection, site not specified: Secondary | ICD-10-CM | POA: Diagnosis present

## 2012-10-18 LAB — GLUCOSE, CAPILLARY
Glucose-Capillary: 293 mg/dL — ABNORMAL HIGH (ref 70–99)
Glucose-Capillary: 274 mg/dL — ABNORMAL HIGH (ref 70–99)
Glucose-Capillary: 231 mg/dL — ABNORMAL HIGH (ref 70–99)
Glucose-Capillary: 278 mg/dL — ABNORMAL HIGH (ref 70–99)
Glucose-Capillary: 345 mg/dL — ABNORMAL HIGH (ref 70–99)

## 2012-10-18 LAB — CULTURE, BLOOD (ROUTINE X 2): Culture: NO GROWTH

## 2012-10-18 LAB — MRSA PCR SCREENING: MRSA by PCR: NEGATIVE

## 2012-10-18 MED ORDER — SODIUM CHLORIDE 0.9 % IV SOLN
INTRAVENOUS | Status: DC
  Administered 2012-10-18 (×2): via INTRAVENOUS
  Administered 2012-10-19: 100 mL/h via INTRAVENOUS

## 2012-10-18 MED ORDER — ENOXAPARIN SODIUM 30 MG/0.3ML ~~LOC~~ SOLN
30.0000 mg | SUBCUTANEOUS | Status: DC
  Administered 2012-10-18 – 2012-10-19 (×2): 30 mg via SUBCUTANEOUS
  Filled 2012-10-18 (×2): qty 0.3

## 2012-10-18 MED ORDER — INSULIN GLARGINE 100 UNIT/ML ~~LOC~~ SOLN
10.0000 [IU] | Freq: Every day | SUBCUTANEOUS | Status: DC
  Administered 2012-10-18: 10 [IU] via SUBCUTANEOUS

## 2012-10-18 MED ORDER — INSULIN ASPART 100 UNIT/ML ~~LOC~~ SOLN
0.0000 [IU] | Freq: Three times a day (TID) | SUBCUTANEOUS | Status: DC
  Administered 2012-10-19 (×2): 4 [IU] via SUBCUTANEOUS

## 2012-10-18 MED ORDER — INSULIN ASPART 100 UNIT/ML ~~LOC~~ SOLN: 15.0000 [IU] | Freq: Once | SUBCUTANEOUS | Status: DC

## 2012-10-18 MED ORDER — INSULIN ASPART 100 UNIT/ML ~~LOC~~ SOLN
0.0000 [IU] | Freq: Three times a day (TID) | SUBCUTANEOUS | Status: DC
  Administered 2012-10-18 (×3): 5 [IU] via SUBCUTANEOUS

## 2012-10-18 MED ORDER — PANTOPRAZOLE SODIUM 40 MG PO TBEC
40.0000 mg | DELAYED_RELEASE_TABLET | Freq: Every day | ORAL | Status: DC
  Administered 2012-10-18 – 2012-10-20 (×3): 40 mg via ORAL
  Filled 2012-10-18 (×3): qty 1

## 2012-10-18 MED ORDER — DEXTROSE 5 % IV SOLN
1.0000 g | INTRAVENOUS | Status: DC
  Administered 2012-10-18 – 2012-10-20 (×3): 1 g via INTRAVENOUS
  Filled 2012-10-18 (×3): qty 10

## 2012-10-18 NOTE — Progress Notes (Signed)
[facility] PROGRESS NOTE  [name] ZOX:[id number] DOB: 09-Nov-1980 DOA: 10/17/2012 PCP: Gweneth Dimitri, MD  Assessment/Plan:  #1 acute encephalopathy  likely secondary to hypoglycemia in the setting of probable UTI. Patient is currently alert awake talkative and in no acute distress. Urine cultures are pending. Will check a chest x-ray. Patient is currently afebrile. Continue empiric IV Rocephin. CBGs have improved. Follow CBGs. Change D5 half-normal saline to normal saline. Follow.  #2 hypoglycemia Questionable etiology. Likely secondary to patient being noncompliant in the past and finally taking the insulin as prescribed in the setting of low oral intake and possible UTI. CBGs have improved of D5 normal saline. CBGs have ranged from 78-180. D/C D5 normal saline. Start patient on a carb modified diet. Place on normal saline. Sensitive sliding scale insulin.  #3 hypothermia Likely secondary to hypoglycemia and possible UTI. Improved. Currently off bear hugger. Patient did have a leukocytosis however is also on steroids. Urine cultures are pending. Check a chest x-ray. Continue empiric IV Rocephin.  #4 probable UTI Urine cultures are pending. Continue IV Rocephin.  #5 leukocytosis Likely secondary to probable UTI in the setting of steroids. Urine cultures are pending. Check a chest x-ray. Continue empiric IV Rocephin.  #6 poorly controlled type 1 diabetes Hemoglobin A1c was greater than 20 on 10/11/2012. Likely secondary to noncompliance in addition to being on steroids. Patient had presented with hypoglycemia. We'll place on a sliding scale insulin for now. Follow. If CBGs improve we'll start on half home dose of Lantus.  #7 Addison's disease Stable. Continue Florinef and Cortef. If systolic blood pressure drops below 90 will need to place on stress dose steroids.  #8 prophylaxis PPI for GI prophylaxis. Lovenox for DVT prophylaxis.    Code Status: Full Family Communication:  Updated patient no family at bedside Disposition Plan: Home when medically stable   Consultants:  None  Procedures:  None  Antibiotics:  IV Rocephin 10/18/12  HPI/Subjective: Patient alert. No complaints.  Objective: Filed Vitals:   10/18/12 0320 10/18/12 0400 10/18/12 0500 10/18/12 0600  BP: 100/59 97/53 94/50  94/45  Pulse: 110 101    Temp: 100.6 F (38.1 C) 100.8 F (38.2 C) 99.5 F (37.5 C)   TempSrc:      Resp: 13 16 13 12   Height:      Weight:      SpO2: 100% 100% 98% 99%    Intake/Output Summary (Last 24 hours) at 10/18/12 0819 Last data filed at 10/18/12 0700  Gross per 24 hour  Intake    715 ml  Output    950 ml  Net   -235 ml   Filed Weights   10/18/12 0215  Weight: 56.2 kg (123 lb 14.4 oz)    Exam:   General:  NAD  Cardiovascular: RRR  Respiratory: CTAB  Abdomen: sOFT/nt/nd/+bs  Extremities: No c/c/e  Data Reviewed: Basic Metabolic Panel:  Recent Labs Lab 10/12/12 1548 10/12/12 1801 10/12/12 1939 10/13/12 0340 10/17/12 2014  NA 134* 136 135 133* 136  K 3.6 3.9 3.5 4.0 3.7  CL 104 106 106 103 101  CO2 18* 21 19 18* 26  GLUCOSE 189* 113* 224* 304* 127*  BUN 8 7 8 9 9   CREATININE 0.54 0.55 0.67 0.56 0.43*  CALCIUM 8.3* 8.4 7.8* 7.8* 8.7   Liver Function Tests: No results found for this basename: AST, ALT, ALKPHOS, BILITOT, PROT, ALBUMIN,  in the last 168 hours No results found for this basename: LIPASE, AMYLASE,  in the last 168 hours  No results found for this basename: AMMONIA,  in the last 168 hours CBC:  Recent Labs Lab 10/12/12 0550 10/13/12 0340 10/17/12 2014  WBC 22.3* 9.9 17.3*  NEUTROABS  --   --  13.3*  HGB 13.3 11.4* 14.4  HCT 37.0 32.0* 41.4  MCV 87.5 88.4 91.6  PLT 305 221 318   Cardiac Enzymes: No results found for this basename: CKTOTAL, CKMB, CKMBINDEX, TROPONINI,  in the last 168 hours BNP (last 3 results) No results found for this basename: PROBNP,  in the last 8760 hours CBG:  Recent  Labs Lab 10/13/12 1154 10/17/12 2039 10/17/12 2302 10/18/12 0003 10/18/12 0410  GLUCAP 284* 78 151* 119* 180*    Recent Results (from the past 240 hour(s))  MRSA PCR SCREENING     Status: None   Collection Time    10/11/12  9:16 AM      Result Value Range Status   MRSA by PCR NEGATIVE  NEGATIVE Final   Comment:            The GeneXpert MRSA Assay (FDA     approved for NASAL specimens     only), is one component of a     comprehensive MRSA colonization     surveillance program. It is not     intended to diagnose MRSA     infection nor to guide or     monitor treatment for     MRSA infections.  CULTURE, BLOOD (ROUTINE X 2)     Status: None   Collection Time    10/12/12 10:15 AM      Result Value Range Status   Specimen Description BLOOD LEFT WRIST   Final   Special Requests BOTTLES DRAWN AEROBIC AND ANAEROBIC 5CC   Final   Culture  Setup Time 10/12/2012 18:02   Final   Culture     Final   Value:        BLOOD CULTURE RECEIVED NO GROWTH TO DATE CULTURE WILL BE HELD FOR 5 DAYS BEFORE ISSUING A FINAL NEGATIVE REPORT   Report Status PENDING   Incomplete  CULTURE, BLOOD (ROUTINE X 2)     Status: None   Collection Time    10/12/12 10:25 AM      Result Value Range Status   Specimen Description BLOOD RIGHT HAND   Final   Special Requests BOTTLES DRAWN AEROBIC AND ANAEROBIC 5CC   Final   Culture  Setup Time 10/12/2012 18:02   Final   Culture     Final   Value:        BLOOD CULTURE RECEIVED NO GROWTH TO DATE CULTURE WILL BE HELD FOR 5 DAYS BEFORE ISSUING A FINAL NEGATIVE REPORT   Report Status PENDING   Incomplete  MRSA PCR SCREENING     Status: None   Collection Time    10/18/12  2:10 AM      Result Value Range Status   MRSA by PCR NEGATIVE  NEGATIVE Final   Comment:            The GeneXpert MRSA Assay (FDA     approved for NASAL specimens     only), is one component of a     comprehensive MRSA colonization     surveillance program. It is not     intended to diagnose MRSA      infection nor to guide or     monitor treatment for     MRSA infections.     Studies:  No results found.  Scheduled Meds: . cefTRIAXone (ROCEPHIN)  IV  1 g Intravenous Q24H  . citalopram  20 mg Oral Daily  . fludrocortisone  0.1 mg Oral Daily  . heparin  5,000 Units Subcutaneous Q8H  . hydrocortisone  10 mg Oral BID  . norethindrone  1 tablet Oral Daily  . rOPINIRole  0.25 mg Oral QHS   Continuous Infusions: . sodium chloride      Principal Problem:   Encephalopathy acute Active Problems:   DM I (diabetes mellitus, type I), uncontrolled   Addison disease   Leukocytosis   Depression   UTI (urinary tract infection)   Hypoglycemia   Hypothermia not associated with low environmental temperature    Time spent: > 35 mins    [facility]  [facility] Pager [phone]. If 7PM-7AM, please contact night-coverage at [email or web address], password [facility] 10/18/2012, 8:19 AM  LOS: 1 day

## 2012-10-18 NOTE — Progress Notes (Signed)
CBG for 8 pm - 332.  [facility] aware.  No orders received.  Cont to monitor.

## 2012-10-19 LAB — GLUCOSE, CAPILLARY
Glucose-Capillary: 157 mg/dL — ABNORMAL HIGH (ref 70–99)
Glucose-Capillary: 224 mg/dL — ABNORMAL HIGH (ref 70–99)
Glucose-Capillary: 249 mg/dL — ABNORMAL HIGH (ref 70–99)
Glucose-Capillary: 318 mg/dL — ABNORMAL HIGH (ref 70–99)
Glucose-Capillary: 334 mg/dL — ABNORMAL HIGH (ref 70–99)
Glucose-Capillary: 365 mg/dL — ABNORMAL HIGH (ref 70–99)
Glucose-Capillary: 380 mg/dL — ABNORMAL HIGH (ref 70–99)

## 2012-10-19 LAB — BASIC METABOLIC PANEL
Chloride: 97 mEq/L (ref 96–112)
Creatinine, Ser: 0.56 mg/dL (ref 0.50–1.10)
GFR calc Af Amer: >90 mL/min (ref >90)
GFR calc non Af Amer: >90 mL/min (ref >90)
Potassium: 4.1 mEq/L (ref 3.5–5.1)

## 2012-10-19 LAB — URINE CULTURE: Colony Count: 75000

## 2012-10-19 LAB — CBC
HCT: 31 % — ABNORMAL LOW (ref 36.0–46.0)
MCHC: 35.2 g/dL (ref 30.0–36.0)
RDW: 12.4 % (ref 11.5–15.5)
WBC: 10.3 10*3/uL (ref 4.0–10.5)

## 2012-10-19 LAB — HEMOGLOBIN A1C
Hgb A1c MFr Bld: 11.5 % — ABNORMAL HIGH (ref <5.7)
Mean Plasma Glucose: 283 mg/dL — ABNORMAL HIGH (ref <117)

## 2012-10-19 MED ORDER — INSULIN ASPART 100 UNIT/ML ~~LOC~~ SOLN
0.0000 [IU] | Freq: Three times a day (TID) | SUBCUTANEOUS | Status: DC
  Administered 2012-10-19: 7 [IU] via SUBCUTANEOUS
  Administered 2012-10-20: 3 [IU] via SUBCUTANEOUS
  Administered 2012-10-20: 5 [IU] via SUBCUTANEOUS

## 2012-10-19 MED ORDER — INSULIN ASPART 100 UNIT/ML ~~LOC~~ SOLN
15.0000 [IU] | Freq: Once | SUBCUTANEOUS | Status: AC
  Administered 2012-10-19: 15 [IU] via SUBCUTANEOUS

## 2012-10-19 MED ORDER — INSULIN GLARGINE 100 UNIT/ML ~~LOC~~ SOLN
18.0000 [IU] | Freq: Every day | SUBCUTANEOUS | Status: DC
  Administered 2012-10-19: 18 [IU] via SUBCUTANEOUS

## 2012-10-19 MED ORDER — INSULIN ASPART 100 UNIT/ML ~~LOC~~ SOLN
4.0000 [IU] | Freq: Three times a day (TID) | SUBCUTANEOUS | Status: DC
  Administered 2012-10-19 – 2012-10-20 (×3): 4 [IU] via SUBCUTANEOUS

## 2012-10-19 MED ORDER — FLUDROCORTISONE ACETATE 0.1 MG PO TABS
0.0500 mg | ORAL_TABLET | Freq: Every day | ORAL | Status: DC
Start: 2012-10-19 — End: 2012-10-20
  Administered 2012-10-19 – 2012-10-20 (×2): 0.05 mg via ORAL
  Filled 2012-10-19 (×2): qty 0.5

## 2012-10-19 MED ORDER — ENOXAPARIN SODIUM 40 MG/0.4ML ~~LOC~~ SOLN
40.0000 mg | SUBCUTANEOUS | Status: DC
  Administered 2012-10-20: 40 mg via SUBCUTANEOUS
  Filled 2012-10-19: qty 0.4

## 2012-10-19 MED ORDER — LORATADINE 10 MG PO TABS
10.0000 mg | ORAL_TABLET | Freq: Every day | ORAL | Status: DC
  Administered 2012-10-19 – 2012-10-20 (×2): 10 mg via ORAL
  Filled 2012-10-19 (×2): qty 1

## 2012-10-19 MED ORDER — INSULIN GLARGINE 100 UNIT/ML ~~LOC~~ SOLN: 15.0000 [IU] | Freq: Every day | SUBCUTANEOUS | Status: DC

## 2012-10-19 NOTE — Progress Notes (Signed)
Inpatient Diabetes Program Recommendations  AACE/ADA: New Consensus Statement on Inpatient Glycemic Control (2013)  Target Ranges:  Prepandial:   less than 140 mg/dL      Peak postprandial:   less than 180 mg/dL (1-2 hours)      Critically ill patients:  140 - 180 mg/dL   Reason for Visit: Diabetes Consult  32 y.o. year old female with significant past medical history of type 1 diabetes and recent multiple admissions for DKA presenting with hypothermia hypoglycemia. Patient recently admitted 3/9-3/11 for DKA. Patient reported for followup with her endocrinologist. Patient states that her Lantus dose was increased by one unit. Patient states she took her increased dose last night however ate only a small amount of food. Per report patient became very weak and lethargic. EMS was called and patient was noted to have a blood sugar of around 20. Pt states she had taken Lantus 21 units and Novolog 6 units, but failed to eat, and had eaten very little on Saturday.  Acknowledges being depressed - with recent job loss.  Has psych appt in mid April that was set up at last hospital admission.  Pt states she would like to talk with a counselor at [facility] that she has heard about.  Also verbalized her intention of going to [facility].  States she is worried that she isn't on her normal Lantus dose and is not getting meal coverage insulin.  When asked if she had ordered lunch, she said "I'm going to in a few minutes."  Results for [name], [name] (MRN [id number]) as of 10/19/2012 13:08  Ref. Range 10/11/2012 13:00  Hemoglobin A1C Latest Range: <5.7 % >20.0 (H)    Results for [name], [name] (MRN [id number]) as of 10/19/2012 13:08  Ref. Range 10/18/2012 14:28 10/18/2012 15:59 10/18/2012 17:48 10/18/2012 20:24 10/18/2012 21:52 10/18/2012 23:54 10/19/2012 01:58 10/19/2012 03:45 10/19/2012 06:16 10/19/2012 07:32 10/19/2012 11:34  Glucose-Capillary Latest Range: 70-99 mg/dL 086 (H) 578 (H) 469 (H) 332 (H) 345  (H) 380 (H) 334 (H) 365 (H) 249 (H) 179 (H) 157 (H)   Lengthy discussion regarding importance of eating regular meals and taking insulin as prescribed.  Pt lives alone and needs lots of support.  Verbalized understanding that she needs to make eating important, just like taking her medicine.    Inpatient Diabetes Program Recommendations Insulin - Basal: Increase Lantus to 18 units QHS - to begin tonight Correction (SSI): Decrease Novolog to sensitive tidwc and hs Insulin - Meal Coverage: Add Novolog 4 units tidwc if pt eats >50% meal. (Pt has CF of 1:18.) HgbA1C: Consider rechecking HgbA1C.  (Pt states she went to [facility] on Friday and it was 11.7%) on 10/11/2012 it was >20 here at [facility]. Outpatient Referral: Will reorder OP Diabetes Education consult for Type 1 DM, uncontrolled  Note: Discussed with RN.  Will text page Dr. Janee Morn with recommendations.   Thank you. Ailene Ards, RD, LDN, CDE Inpatient Diabetes Coordinator [phone]

## 2012-10-19 NOTE — Progress Notes (Signed)
[facility] PROGRESS NOTE  [name] NFA:[id number] DOB: 1980-11-01 DOA: 10/17/2012 PCP: Gweneth Dimitri, MD  Assessment/Plan:  #1 acute encephalopathy  likely secondary to hypoglycemia in the setting of probable UTI. Patient is currently alert awake talkative and in no acute distress. Urine cultures are pending. Chest x-ray negative. Patient is currently afebrile. Continue empiric IV Rocephin. CBGs have improved and now in 200-300s. Follow.  #2 hypoglycemia Questionable etiology. Likely secondary to patient being noncompliant in the past and finally taking the insulin as prescribed in the setting of low oral intake and possible UTI. CBGs have ranged in 200-300s. NSL IVF.  #3 hypothermia Likely secondary to hypoglycemia and possible UTI. Improved. Currently off bear hugger. Patient did have a leukocytosis however is also on steroids. Urine cultures are pending. Chest x-ray negative. Continue empiric IV Rocephin.  #4 probable UTI Urine cultures are pending. Continue IV Rocephin.  #5 leukocytosis Likely secondary to probable UTI in the setting of steroids. Urine cultures are pending. Chest x-ray negative. Leukocytosis trending down. Continue empiric IV Rocephin.  #6 poorly controlled type 1 diabetes Hemoglobin A1c was greater than 20 on 10/11/2012. Likely secondary to noncompliance in addition to being on steroids. Patient had presented with hypoglycemia. CBGs have been in 200-300s. Patient was started on Lantus 10u last night. Will increase lantus to 15U today. Continue resistant SSI. Will need close outpatient follow up.  #7 Addison's disease Stable. Continue Florinef and Cortef.   #8 prophylaxis PPI for GI prophylaxis. Lovenox for DVT prophylaxis.    Code Status: Full Family Communication: Updated patient no family at bedside Disposition Plan: Transfer to telemetry. Home when medically stable in 1-2 days   Consultants:  None  Procedures:  CXR  10/18/12  Antibiotics:  IV Rocephin 10/18/12  HPI/Subjective: Patient alert. No complaints. Feeling better.  Objective: Filed Vitals:   10/18/12 2200 10/19/12 0000 10/19/12 0400 10/19/12 0500  BP: 105/54 118/65 114/68   Pulse:      Temp:  99.1 F (37.3 C)  98.6 F (37 C)  TempSrc:  Oral  Oral  Resp: 19 18 16    Height:      Weight:      SpO2: 97% 97% 97%     Intake/Output Summary (Last 24 hours) at 10/19/12 0749 Last data filed at 10/19/12 0300  Gross per 24 hour  Intake 2233.75 ml  Output   3350 ml  Net -1116.25 ml   Filed Weights   10/18/12 0215  Weight: 56.2 kg (123 lb 14.4 oz)    Exam:   General:  NAD  Cardiovascular: RRR  Respiratory: CTAB anterior lung fields.  Abdomen: sOFT/nt/nd/+bs  Extremities: No c/c/e  Data Reviewed: Basic Metabolic Panel:  Recent Labs Lab 10/12/12 1801 10/12/12 1939 10/13/12 0340 10/17/12 2014 10/19/12 0345  NA 136 135 133* 136 131*  K 3.9 3.5 4.0 3.7 4.1  CL 106 106 103 101 97  CO2 21 19 18* 26 20  GLUCOSE 113* 224* 304* 127* 388*  BUN 7 8 9 9 13   CREATININE 0.55 0.67 0.56 0.43* 0.56  CALCIUM 8.4 7.8* 7.8* 8.7 7.9*   Liver Function Tests: No results found for this basename: AST, ALT, ALKPHOS, BILITOT, PROT, ALBUMIN,  in the last 168 hours No results found for this basename: LIPASE, AMYLASE,  in the last 168 hours No results found for this basename: AMMONIA,  in the last 168 hours CBC:  Recent Labs Lab 10/13/12 0340 10/17/12 2014 10/19/12 0345  WBC 9.9 17.3* 10.3  NEUTROABS  --  13.3*  --   HGB 11.4* 14.4 11.4*  HCT 32.0* 41.4 31.0*  MCV 88.4 91.6 90.1  PLT 221 318 254   Cardiac Enzymes: No results found for this basename: CKTOTAL, CKMB, CKMBINDEX, TROPONINI,  in the last 168 hours BNP (last 3 results) No results found for this basename: PROBNP,  in the last 8760 hours CBG:  Recent Labs Lab 10/18/12 2152 10/18/12 2354 10/19/12 0158 10/19/12 0345 10/19/12 0616  GLUCAP 345* 380* 334* 365* 249*     Recent Results (from the past 240 hour(s))  MRSA PCR SCREENING     Status: None   Collection Time    10/11/12  9:16 AM      Result Value Range Status   MRSA by PCR NEGATIVE  NEGATIVE Final   Comment:            The GeneXpert MRSA Assay (FDA     approved for NASAL specimens     only), is one component of a     comprehensive MRSA colonization     surveillance program. It is not     intended to diagnose MRSA     infection nor to guide or     monitor treatment for     MRSA infections.  CULTURE, BLOOD (ROUTINE X 2)     Status: None   Collection Time    10/12/12 10:15 AM      Result Value Range Status   Specimen Description BLOOD LEFT WRIST   Final   Special Requests BOTTLES DRAWN AEROBIC AND ANAEROBIC 5CC   Final   Culture  Setup Time 10/12/2012 18:02   Final   Culture NO GROWTH 5 DAYS   Final   Report Status 10/18/2012 FINAL   Final  CULTURE, BLOOD (ROUTINE X 2)     Status: None   Collection Time    10/12/12 10:25 AM      Result Value Range Status   Specimen Description BLOOD RIGHT HAND   Final   Special Requests BOTTLES DRAWN AEROBIC AND ANAEROBIC 5CC   Final   Culture  Setup Time 10/12/2012 18:02   Final   Culture NO GROWTH 5 DAYS   Final   Report Status 10/18/2012 FINAL   Final  MRSA PCR SCREENING     Status: None   Collection Time    10/18/12  2:10 AM      Result Value Range Status   MRSA by PCR NEGATIVE  NEGATIVE Final   Comment:            The GeneXpert MRSA Assay (FDA     approved for NASAL specimens     only), is one component of a     comprehensive MRSA colonization     surveillance program. It is not     intended to diagnose MRSA     infection nor to guide or     monitor treatment for     MRSA infections.     Studies: Dg Chest Port 1 View  10/18/2012  *RADIOLOGY REPORT*  Clinical Data: Hypoglycemia.  Leukocytosis.  PORTABLE CHEST - 1 VIEW  Comparison: 10/12/2012  Findings: Artifact overlies chest.  Heart size is normal. Mediastinal shadows are normal.   The lungs are clear.  No effusions.  No bony abnormalities.  IMPRESSION: No active disease   Original Report Authenticated By: Paulina Fusi, M.D.     Scheduled Meds: . cefTRIAXone (ROCEPHIN)  IV  1 g Intravenous Q24H  . citalopram  20 mg Oral  Daily  . enoxaparin (LOVENOX) injection  30 mg Subcutaneous Q24H  . fludrocortisone  0.1 mg Oral Daily  . hydrocortisone  10 mg Oral BID  . insulin aspart  0-20 Units Subcutaneous TID WC  . insulin glargine  10 Units Subcutaneous QHS  . norethindrone  1 tablet Oral Daily  . pantoprazole  40 mg Oral Daily  . rOPINIRole  0.25 mg Oral QHS   Continuous Infusions:    Principal Problem:   Encephalopathy acute Active Problems:   DM I (diabetes mellitus, type I), uncontrolled   Addison disease   Leukocytosis   Depression   UTI (urinary tract infection)   Hypoglycemia   Hypothermia not associated with low environmental temperature    Time spent: > 35 mins    [facility]  [facility] Pager [phone]. If 7PM-7AM, please contact night-coverage at [email or web address], password [facility] 10/19/2012, 7:49 AM  LOS: 2 days

## 2012-10-19 NOTE — Progress Notes (Signed)
Received from ICU, alert and oriented, parents at bedside, patient wants to talk to [facility] Diabetes coordinator. Placed on telemetry monitor verified placement with Ronaldo Miyamoto from [facility].

## 2012-10-20 DIAGNOSIS — B3749 Other urogenital candidiasis: Secondary | ICD-10-CM | POA: Diagnosis present

## 2012-10-20 LAB — BASIC METABOLIC PANEL
CO2: 23 mEq/L (ref 19–32)
Calcium: 8 mg/dL — ABNORMAL LOW (ref 8.4–10.5)
Creatinine, Ser: 0.45 mg/dL — ABNORMAL LOW (ref 0.50–1.10)
GFR calc Af Amer: >90 mL/min (ref >90)
GFR calc non Af Amer: >90 mL/min (ref >90)
Sodium: 132 mEq/L — ABNORMAL LOW (ref 135–145)

## 2012-10-20 LAB — CBC
MCV: 90.4 fL (ref 78.0–100.0)
Platelets: 334 10*3/uL (ref 150–400)
RBC: 3.4 MIL/uL — ABNORMAL LOW (ref 3.87–5.11)
RDW: 11.9 % (ref 11.5–15.5)
WBC: 7.1 10*3/uL (ref 4.0–10.5)

## 2012-10-20 MED ORDER — INSULIN GLARGINE 100 UNIT/ML ~~LOC~~ SOLN
21.0000 [IU] | Freq: Every day | SUBCUTANEOUS | Status: DC
  Filled 2012-10-20: qty 0.21

## 2012-10-20 MED ORDER — ONDANSETRON 4 MG PO TBDP: 4.0000 mg | ORAL_TABLET | Freq: Three times a day (TID) | ORAL | Status: DC | PRN

## 2012-10-20 MED ORDER — INSULIN ASPART 100 UNIT/ML ~~LOC~~ SOLN: 4.0000 [IU] | Freq: Three times a day (TID) | SUBCUTANEOUS | Status: DC

## 2012-10-20 NOTE — Discharge Summary (Signed)
Physician Discharge Summary  [name] ZOX:[id number] DOB: October 06, 1980 DOA: 10/17/2012  PCP: Gweneth Dimitri, MD  Admit date: 10/17/2012 Discharge date: 10/20/2012  Time spent: 65 minutes  Recommendations for Outpatient Follow-up:  1. Follow up with endocrinologist , March Rummage, PA in 1 week. 2. Follow up with MCNEILL,WENDY, MD in 2 weeks.   Discharge Diagnoses:  Principal Problem:   Encephalopathy acute Active Problems:   DM I (diabetes mellitus, type I), uncontrolled   Addison disease   Leukocytosis   Depression   UTI (urinary tract infection)   Hypoglycemia   Hypothermia not associated with low environmental temperature   Candida UTI   Discharge Condition: Stable and improved  Diet recommendation: Carb modified  Filed Weights   10/18/12 0215  Weight: 56.2 kg (123 lb 14.4 oz)    History of present illness:  This is a 32 y.o. year old female with significant past medical history of type 1 diabetes and recent multiple admissions for DKA presenting with hypothermia hypoglycemia. Patient recently admitted 3/9-3/11 for DKA. Patient reported for followup with her endocrinologist. Patient states that her Lantus dose was increased by one unit. Patient states she took her increased dose last night however ate only a small amount of food. Per report patient became very weak and lethargic. EMS was called and patient was noted to have a blood sugar of around 20. Patient received D50 and was subsequently brought to the ER. In the ER, patient had a temperature of 93.6. Patient was given glucagon as well as IV fluids and bear hugger. Temperature improved to around 100. Blood sugars improved rate and 70 to Patient denies taking any other medications or intentionally missing food. Patient denies any hemiparesis, confusion, shortness of breath, chest pain, nausea, vomiting. No trouble with urination or bowel movements. Baseline history of Addison's disease. Patient states this is been  well-controlled.  Patient does admit to a very erratic eating regimen.   Hospital Course:  1 acute encephalopathy  Patient was admitted with lethargy and noted per EMS to have a CBG 20. It was felt patient's AMS was secondary to hypoglycemia in the setting of probable UTI and hypothermia. Patient was placed on a bear hugger, IV antibiotics and D5NS. Urine cultures were obtained. Chest was ordered and was negative. Patient improved clinically. Hypothermia resolved and CBGs improved. Patient became alert and oriented and back to baseline by day of discharge. #2 hypoglycemia  Patient was admitted with CBG 20. Felt to be secondary to patient being noncompliant in the past and finally taking the insulin as prescribed in the setting of low oral intake and possible UTI. Patient was placed initially on D5NS and IV antibiotics for probable UTI. CBGs improved. D5NS was discontinued and patient started on SSI and lantus at 10 units which was subsequently uptitrated back to her home dose of Lantus 21 units daily.  CBGs have ranged in 200-300s, during the hospitalization. Patient will be discharged in stable and improved condition.  #3 hypothermia  Patient noted to be hypothermic on admission. It was felt to be secondary to hypoglycemia and possible UTI. Patient was initially placed on the bear hugger. Patients CBG was improved and she was empirically placed on IV antibiotics. Urine cultures were positive for yeast. Chest x-ray negative. Hypothermia had resolved by day of discharge. #4 Candiduria On admission UA done was worrrisome for UTI. Patient was initially placed on IV Rocephin. Urine cultures came back with yeast. Patient received 3 days of  IV Rocephin. No further treatment is needed. #  5 leukocytosis  Likely secondary to probable UTI in the setting of steroids. Urine cultures positive for yeast. Chest x-ray negative. Leukocytosis trending down. Patient s/p 3 days IV Rocephin.  #6 poorly controlled type 1  diabetes  Hemoglobin A1c was greater than 20 on 10/11/2012. Repeat A1C was 11.5 10/19/12. Likely secondary to noncompliance in addition to being on steroids. Patient had presented with hypoglycemia. CBGs have been in 200-300s. Patient was started on Lantus 10u and this further uptitrated to 21 units on discharge. Patient was also plaved on Novolog 4 units meal coverage and SSI. Patient will follow up with endocrinologist as outpatient.  #7 Addison's disease  Stable throughout the hospitalization. Patient was continued on her home dose Florinef and Cortef.    Procedures: CXR 10/18/12   Consultations:  None  Discharge Exam: Filed Vitals:   10/19/12 1055 10/19/12 1329 10/19/12 2031 10/20/12 0448  BP: 105/66 108/64 106/84 104/56  Pulse: 99 91 91 80  Temp: 98.4 F (36.9 C) 98.1 F (36.7 C) 98.2 F (36.8 C) 98.3 F (36.8 C)  TempSrc: Oral Oral Oral Oral  Resp: 20 20 18 16   Height:      Weight:      SpO2: 91% 98% 100% 98%    General: NAD Cardiovascular: RRR Respiratory: CTAB  Discharge Instructions      Discharge Orders   Future Orders Complete By Expires     Diet Carb Modified  As directed     Discharge instructions  As directed     Comments:      Follow up with endocrinologist in 1 week.    Increase activity slowly  As directed         Medication List    STOP taking these medications       insulin lispro 100 UNIT/ML injection  Commonly known as:  HUMALOG      TAKE these medications       cetirizine 10 MG tablet  Commonly known as:  ZYRTEC  Take 10 mg by mouth daily.     citalopram 20 MG tablet  Commonly known as:  CELEXA  Take 1 tablet (20 mg total) by mouth daily.     fludrocortisone 0.1 MG tablet  Commonly known as:  FLORINEF  Take 0.05 mg by mouth daily.     hydrocortisone 10 MG tablet  Commonly known as:  CORTEF  Take 10 mg by mouth 2 (two) times daily.     insulin aspart 100 UNIT/ML injection  Commonly known as:  novoLOG  Inject 4 Units into  the skin 3 (three) times daily with meals.     insulin glargine 100 UNIT/ML injection  Commonly known as:  LANTUS  Inject 21 Units into the skin at bedtime.     norethindrone 0.35 MG tablet  Commonly known as:  ERRIN  Take 1 tablet (0.35 mg total) by mouth daily.     ondansetron 4 MG disintegrating tablet  Commonly known as:  ZOFRAN ODT  Take 1 tablet (4 mg total) by mouth every 8 (eight) hours as needed for nausea.     ramipril 2.5 MG capsule  Commonly known as:  ALTACE  Take 2.5 mg by mouth daily.     rOPINIRole 0.25 MG tablet  Commonly known as:  REQUIP  Take 0.25 mg by mouth at bedtime.       Follow-up Information   Follow up with Yetta Barre, AUTUMN SHARIE, PA-C. Schedule an appointment as soon as possible for a visit in 1 week.  Contact information:   [address] [place] [address] [phone]       Follow up with [facility], MD. Schedule an appointment as soon as possible for a visit in 2 weeks.   Contact information:   [address] [place] [address] [phone]        The results of significant diagnostics from this hospitalization (including imaging, microbiology, ancillary and laboratory) are listed below for reference.    Significant Diagnostic Studies: Dg Chest 2 View  10/12/2012  *RADIOLOGY REPORT*  Clinical Data: Cough, fever  CHEST - 2 VIEW  Comparison: Prior chest x-ray 08/24/2012  Findings: The lungs are well-aerated.  Negative for consolidation, pleural effusion, pneumothorax or pulmonary edema.  Cardiac and mediastinal contours are within normal limits.  No acute osseous abnormality.  IMPRESSION: No acute cardiopulmonary disease.   Original Report Authenticated By: Malachy Moan, M.D.    Dg Chest Port 1 View  10/18/2012  *RADIOLOGY REPORT*  Clinical Data: Hypoglycemia.  Leukocytosis.  PORTABLE CHEST - 1 VIEW  Comparison: 10/12/2012  Findings: Artifact overlies chest.  Heart size is normal. Mediastinal shadows  are normal.  The lungs are clear.  No effusions.  No bony abnormalities.  IMPRESSION: No active disease   Original Report Authenticated By: Paulina Fusi, M.D.     Microbiology: Recent Results (from the past 240 hour(s))  MRSA PCR SCREENING     Status: None   Collection Time    10/11/12  9:16 AM      Result Value Range Status   MRSA by PCR NEGATIVE  NEGATIVE Final   Comment:            The GeneXpert MRSA Assay (FDA     approved for NASAL specimens     only), is one component of a     comprehensive MRSA colonization     surveillance program. It is not     intended to diagnose MRSA     infection nor to guide or     monitor treatment for     MRSA infections.  CULTURE, BLOOD (ROUTINE X 2)     Status: None   Collection Time    10/12/12 10:15 AM      Result Value Range Status   Specimen Description BLOOD LEFT WRIST   Final   Special Requests BOTTLES DRAWN AEROBIC AND ANAEROBIC 5CC   Final   Culture  Setup Time 10/12/2012 18:02   Final   Culture NO GROWTH 5 DAYS   Final   Report Status 10/18/2012 FINAL   Final  CULTURE, BLOOD (ROUTINE X 2)     Status: None   Collection Time    10/12/12 10:25 AM      Result Value Range Status   Specimen Description BLOOD RIGHT HAND   Final   Special Requests BOTTLES DRAWN AEROBIC AND ANAEROBIC 5CC   Final   Culture  Setup Time 10/12/2012 18:02   Final   Culture NO GROWTH 5 DAYS   Final   Report Status 10/18/2012 FINAL   Final  URINE CULTURE     Status: None   Collection Time    10/18/12 12:00 AM      Result Value Range Status   Specimen Description URINE, CATHETERIZED   Final   Special Requests Immunocompromised   Final   Culture  Setup Time 10/18/2012 16:53   Final   Colony Count 75,000 COLONIES/ML   Final   Culture YEAST   Final  Report Status 10/19/2012 FINAL   Final  MRSA PCR SCREENING     Status: None   Collection Time    10/18/12  2:10 AM      Result Value Range Status   MRSA by PCR NEGATIVE  NEGATIVE Final   Comment:            The  GeneXpert MRSA Assay (FDA     approved for NASAL specimens     only), is one component of a     comprehensive MRSA colonization     surveillance program. It is not     intended to diagnose MRSA     infection nor to guide or     monitor treatment for     MRSA infections.     Labs: Basic Metabolic Panel:  Recent Labs Lab 10/17/12 2014 10/19/12 0345 10/20/12 0415  NA 136 131* 132*  K 3.7 4.1 3.9  CL 101 97 100  CO2 26 20 23   GLUCOSE 127* 388* 374*  BUN 9 13 13   CREATININE 0.43* 0.56 0.45*  CALCIUM 8.7 7.9* 8.0*   Liver Function Tests: No results found for this basename: AST, ALT, ALKPHOS, BILITOT, PROT, ALBUMIN,  in the last 168 hours No results found for this basename: LIPASE, AMYLASE,  in the last 168 hours No results found for this basename: AMMONIA,  in the last 168 hours CBC:  Recent Labs Lab 10/17/12 2014 10/19/12 0345 10/20/12 0415  WBC 17.3* 10.3 7.1  NEUTROABS 13.3*  --   --   HGB 14.4 11.4* 10.8*  HCT 41.4 31.0* 35.2*  MCV 91.6 90.1 90.4  PLT 318 254 334   Cardiac Enzymes: No results found for this basename: CKTOTAL, CKMB, CKMBINDEX, TROPONINI,  in the last 168 hours BNP: BNP (last 3 results) No results found for this basename: PROBNP,  in the last 8760 hours CBG:  Recent Labs Lab 10/19/12 1605 10/19/12 2029 10/20/12 0011 10/20/12 0446 10/20/12 0817  GLUCAP 318* 224* 234* 323* 257*       Signed:  Jaydn Fincher  [facility] 10/20/2012, 11:43 AM

## 2012-10-20 NOTE — Progress Notes (Signed)
Discharge to home, parents at bedside, discharge instructions and follow up appointments  done and given to the patient. No complaints of pain or discomfort upon discharge. PIV removed no s/s of swelling or infiltration noted .

## 2012-10-20 NOTE — Care Management Note (Signed)
    Page 1 of 1   10/20/2012     1:16:22 PM   CARE MANAGEMENT NOTE 10/20/2012  Patient:  [name],[name]   Account Number:  [id number]  Date Initiated:  10/20/2012  Documentation initiated by:  Lanier Clam  Subjective/Objective Assessment:   ADMITTED W/HYPOGLYCEMIA.     Action/Plan:   FROM HOME.HAS PCP,PHARMACY.   Anticipated DC Date:  10/20/2012   Anticipated DC Plan:  HOME/SELF CARE  In-house referral  Financial Counselor      DC Planning Services  CM consult      Choice offered to / List presented to:             Status of service:  Completed, signed off Medicare Important Message given?   (If response is "NO", the following Medicare IM given date fields will be blank) Date Medicare IM given:   Date Additional Medicare IM given:    Discharge Disposition:  HOME/SELF CARE  Per UR Regulation:  Reviewed for med. necessity/level of care/duration of stay  If discussed at Long Length of Stay Meetings, dates discussed:    Comments:  10/20/12 Kasten Leveque RN,BSN NCM [phone] NO D/C NEEDS OR ORDERS.

## 2012-11-10 ENCOUNTER — Ambulatory Visit (INDEPENDENT_AMBULATORY_CARE_PROVIDER_SITE_OTHER): Payer: Commercial Indemnity | Admitting: Psychiatry

## 2012-11-10 ENCOUNTER — Encounter (HOSPITAL_COMMUNITY): Payer: Self-pay | Admitting: Psychiatry

## 2012-11-10 VITALS — BP 108/80 | HR 98 | Ht 62.0 in | Wt 114.2 lb

## 2012-11-10 DIAGNOSIS — E119 Type 2 diabetes mellitus without complications: Secondary | ICD-10-CM | POA: Insufficient documentation

## 2012-11-10 DIAGNOSIS — F419 Anxiety disorder, unspecified: Secondary | ICD-10-CM

## 2012-11-10 DIAGNOSIS — F329 Major depressive disorder, single episode, unspecified: Secondary | ICD-10-CM

## 2012-11-10 DIAGNOSIS — F3289 Other specified depressive episodes: Secondary | ICD-10-CM

## 2012-11-10 MED ORDER — CITALOPRAM HYDROBROMIDE 40 MG PO TABS: 40.0000 mg | ORAL_TABLET | Freq: Every day | ORAL | Status: DC
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
## 2012-12-04 ENCOUNTER — Ambulatory Visit (HOSPITAL_COMMUNITY): Payer: Self-pay | Admitting: Psychiatry

## 2012-12-29 ENCOUNTER — Ambulatory Visit (HOSPITAL_COMMUNITY): Payer: Commercial Indemnity | Admitting: Psychiatry

## 2013-01-07 ENCOUNTER — Ambulatory Visit (INDEPENDENT_AMBULATORY_CARE_PROVIDER_SITE_OTHER): Payer: Managed Care, Other (non HMO) | Admitting: Family Medicine

## 2013-01-07 ENCOUNTER — Other Ambulatory Visit (HOSPITAL_COMMUNITY): Payer: Self-pay | Admitting: Psychiatry

## 2013-01-07 VITALS — BP 102/66 | HR 88 | Temp 98.9°F | Resp 16 | Ht 62.5 in | Wt 113.0 lb

## 2013-01-07 DIAGNOSIS — E119 Type 2 diabetes mellitus without complications: Secondary | ICD-10-CM

## 2013-01-07 DIAGNOSIS — R5381 Other malaise: Secondary | ICD-10-CM

## 2013-01-07 DIAGNOSIS — E271 Primary adrenocortical insufficiency: Secondary | ICD-10-CM

## 2013-01-07 DIAGNOSIS — R5383 Other fatigue: Secondary | ICD-10-CM

## 2013-01-07 DIAGNOSIS — E2749 Other adrenocortical insufficiency: Secondary | ICD-10-CM

## 2013-01-07 LAB — GLUCOSE, POCT (MANUAL RESULT ENTRY): POC Glucose: 110 mg/dl — AB (ref 70–99)

## 2013-01-07 LAB — POCT UA - MICROSCOPIC ONLY
Casts, Ur, LPF, POC: NEGATIVE
Crystals, Ur, HPF, POC: NEGATIVE

## 2013-01-07 LAB — POCT CBC
Granulocyte percent: 70 %G (ref 37–80)
HCT, POC: 41.5 % (ref 37.7–47.9)
Lymph, poc: 1.6 (ref 0.6–3.4)
MCHC: 32.3 g/dL (ref 31.8–35.4)
MID (cbc): 0.4 (ref 0–0.9)
POC Granulocyte: 4.5 (ref 2–6.9)
POC LYMPH PERCENT: 23.9 %L (ref 10–50)
Platelet Count, POC: 287 10*3/uL (ref 142–424)
RDW, POC: 12.7 %

## 2013-01-07 LAB — POCT GLYCOSYLATED HEMOGLOBIN (HGB A1C): Hemoglobin A1C: 8.5

## 2013-01-07 LAB — POCT URINALYSIS DIPSTICK
Bilirubin, UA: NEGATIVE
Blood, UA: NEGATIVE
Glucose, UA: >=1000
Nitrite, UA: NEGATIVE
Spec Grav, UA: 1.01
Urobilinogen, UA: 0.2
pH, UA: 5.5

## 2013-01-07 LAB — BASIC METABOLIC PANEL
CO2: 26 mEq/L (ref 19–32)
Calcium: 9.5 mg/dL (ref 8.4–10.5)
Creat: 0.69 mg/dL (ref 0.50–1.10)
Glucose, Bld: 82 mg/dL (ref 70–99)

## 2013-01-07 MED ORDER — HYDROCORTISONE 10 MG PO TABS: 10.0000 mg | ORAL_TABLET | Freq: Two times a day (BID) | ORAL | Status: DC

## 2013-01-07 NOTE — Progress Notes (Addendum)
[facility] [address], [place] [address] [phone]  Date:  01/07/2013   Name:  [name]   DOB:  January 08, 1981   MRN:  [id number]  PCP:  Gweneth Dimitri, MD    Chief Complaint: Fatigue   History of Present Illness:  [name] is a 32 y.o. very pleasant female patient who presents with the following:  She has been admitted for DKA twice this year so far.  She also has a history of adrenal insufficiency as well as IDDM.  She is here today  With fatigue, feeling non- specifically weak for a couple of days.    About one week ago she had a temperature of 100.1, but this has resolved.  At this time she does not have a fever or any other specific symptoms or pain.  No urinary sx, no ST.  No N/V/D.    She has noted a mild cough, allergies are acting up (ears are congested).    She feels that her glucose has been ok- sometimes running low.  She had a low two day ago of 51.   Her endocrinologist is with [facility]- she sees Autumn Jones, [place].   No urinary symptoms.   LMP was mid April She uses a sliding scale insulin with meals.  She does not use a pump at this time.   Her mother called her this morning- several times. However, she did not wake up to the phone.  Her mother send a friend over to check on her and she woke up right away. They were concerned that the phone did not awaken her, and she has a suspicion that her blood pressure may be low.    Patient Active Problem List   Diagnosis Date Noted  . Diabetes mellitus 11/10/2012  . Depression 10/11/2012  . Restless leg 06/15/2012  . Addison disease 02/14/2012    Past Medical History  Diagnosis Date  . Diabetes mellitus   . Addison disease   . Allergy   . Addison disease     Past Surgical History  Procedure Laterality Date  . Wisdom tooth extraction      History  Substance Use Topics  . Smoking status: Never Smoker   . Smokeless tobacco: Never Used  . Alcohol Use: 1.2 oz/week    2 Cans  of beer per week     Comment: occasionally    Family History  Problem Relation Age of Onset  . Alzheimer's disease Maternal Grandmother   . Diabetes Paternal Grandmother   . Cancer Paternal Grandfather     Allergies  Allergen Reactions  . Augmentin (Amoxicillin-Pot Clavulanate) Diarrhea    Medication list has been reviewed and updated.  Current Outpatient Prescriptions on File Prior to Visit  Medication Sig Dispense Refill  . cetirizine (ZYRTEC) 10 MG tablet Take 10 mg by mouth daily.      . citalopram (CELEXA) 40 MG tablet Take 1 tablet (40 mg total) by mouth daily.  30 tablet  0  . fludrocortisone (FLORINEF) 0.1 MG tablet Take 0.05 mg by mouth daily.      Marland Kitchen HUMALOG 100 UNIT/ML injection       . hydrocortisone (CORTEF) 10 MG tablet Take 10 mg by mouth 2 (two) times daily.      . insulin glargine (LANTUS) 100 UNIT/ML injection Inject 16 Units into the skin at bedtime.       . norethindrone (ERRIN) 0.35 MG tablet Take 1 tablet (0.35 mg total) by mouth daily.  1 Package  11  . ondansetron (ZOFRAN ODT) 4 MG disintegrating tablet Take 1 tablet (4 mg total) by mouth every 8 (eight) hours as needed for nausea.  15 tablet  0  . ramipril (ALTACE) 2.5 MG capsule Take 2.5 mg by mouth daily.      Marland Kitchen rOPINIRole (REQUIP) 0.25 MG tablet Take 0.25 mg by mouth at bedtime.        No current facility-administered medications on file prior to visit.    Review of Systems:  As per HPI- otherwise negative.   Physical Examination: Filed Vitals:   01/07/13 1339  BP: 100/70  Pulse: 82  Temp: 98.9 F (37.2 C)  Resp: 16   Filed Vitals:   01/07/13 1339  Height: 5' 2.5" (1.588 m)  Weight: 113 lb (51.256 kg)   Body mass index is 20.33 kg/(m^2). Ideal Body Weight: Weight in (lb) to have BMI = 25: 138.6  GEN: WDWN, NAD, Non-toxic, A & O x 3, looks well HEENT: Atraumatic, Normocephalic. Neck supple. No masses, No LAD.  Bilateral TM wnl, oropharynx normal.  PEERL,EOMI.   Ears and Nose: No  external deformity. CV: RRR, No M/G/R. No JVD. No thrill. No extra heart sounds. PULM: CTA B, no wheezes, crackles, rhonchi. No retractions. No resp. distress. No accessory muscle use. ABD: S, NT, ND, +BS. No rebound. No HSM. EXTR: No c/c/e.  Normal strength and tone all extremities NEURO Normal gait.  PSYCH: Normally interactive. Conversant. Not depressed or anxious appearing.  Calm demeanor.   Results for orders placed in visit on 01/07/13  POCT CBC      Result Value Range   WBC 6.5  4.6 - 10.2 K/uL   Lymph, poc 1.6  0.6 - 3.4   POC LYMPH PERCENT 23.9  10 - 50 %L   MID (cbc) 0.4  0 - 0.9   POC MID % 6.1  0 - 12 %M   POC Granulocyte 4.5  2 - 6.9   Granulocyte percent 70.0  37 - 80 %G   RBC 4.44  4.04 - 5.48 M/uL   Hemoglobin 13.4  12.2 - 16.2 g/dL   HCT, POC 16.1  09.6 - 47.9 %   MCV 93.4  80 - 97 fL   MCH, POC 30.2  27 - 31.2 pg   MCHC 32.3  31.8 - 35.4 g/dL   RDW, POC 04.5     Platelet Count, POC 287  142 - 424 K/uL   MPV 10.1  0 - 99.8 fL  GLUCOSE, POCT (MANUAL RESULT ENTRY)      Result Value Range   POC Glucose 110 (*) 70 - 99 mg/dl  POCT GLYCOSYLATED HEMOGLOBIN (HGB A1C)      Result Value Range   Hemoglobin A1C 8.5    POCT URINALYSIS DIPSTICK      Result Value Range   Color, UA yellow     Clarity, UA clear     Glucose, UA >=1000     Bilirubin, UA neg     Ketones, UA neg     Spec Grav, UA 1.010     Blood, UA neg     pH, UA 5.5     Protein, UA neg     Urobilinogen, UA 0.2     Nitrite, UA neg     Leukocytes, UA Negative    POCT UA - MICROSCOPIC ONLY      Result Value Range   WBC, Ur, HPF, POC 0-2     RBC, urine,  microscopic 0-1     Bacteria, U Microscopic trace     Mucus, UA small     Epithelial cells, urine per micros 0-2     Crystals, Ur, HPF, POC neg     Casts, Ur, LPF, POC neg     Yeast, UA neg    POCT URINE PREGNANCY      Result Value Range   Preg Test, Ur Negative      Assessment and Plan: Other malaise and fatigue - Plan: POCT CBC, POCT  urinalysis dipstick, POCT UA - Microscopic Only, POCT urine pregnancy, Basic metabolic panel  Diabetes - Plan: POCT glucose (manual entry), POCT glycosylated hemoglobin (Hb A1C), POCT urinalysis dipstick, POCT UA - Microscopic Only, Basic metabolic panel  Addison disease - Plan: hydrocortisone (CORTEF) 10 MG tablet  Called and discussed with Ms. Yetta Barre, her endocrinology PA. Concern that she may need a stress dose of her steroid due to recent illness and hypotension.   Will have her double her oral hydrocortisone for the next 2 or 3 days and hydrate.   If not better in the next couple of days she is to let us know- Sooner if worse.  Follow -up pending her BMP    Signed Abbe Amsterdam, MD  01/08/2013- called to go over her labs- [facility] that her BMP looks fine, please call if any problems    Chemistry      Component Value Date/Time   NA 136 01/07/2013 1420   K 4.1 01/07/2013 1420   CL 100 01/07/2013 1420   CO2 26 01/07/2013 1420   BUN 15 01/07/2013 1420   CREATININE 0.69 01/07/2013 1420   CREATININE 0.45* 10/20/2012 0415      Component Value Date/Time   CALCIUM 9.5 01/07/2013 1420   ALKPHOS 91 08/24/2012 0605   AST 18 08/24/2012 0605   ALT 12 08/24/2012 0605   BILITOT 0.3 08/24/2012 4098

## 2013-01-07 NOTE — Patient Instructions (Addendum)
Please let us know if you are not feeling better.  Hydrate well to make up for losing sugar in your urine.    Please double you steroid (hydrocortisone) dose for the next 2 or 3 days.   Keep an eye out for any other symptoms.   I will be in touch with metabolic profile results.

## 2013-01-08 ENCOUNTER — Telehealth (HOSPITAL_COMMUNITY): Payer: Self-pay

## 2013-01-12 ENCOUNTER — Other Ambulatory Visit (HOSPITAL_COMMUNITY): Payer: Self-pay | Admitting: Psychiatry

## 2013-01-13 ENCOUNTER — Other Ambulatory Visit (HOSPITAL_COMMUNITY): Payer: Self-pay | Admitting: Psychiatry

## 2013-01-13 NOTE — Telephone Encounter (Signed)
Patient has see once and did not schedule appointment. She need to be seen.

## 2013-01-19 ENCOUNTER — Other Ambulatory Visit (HOSPITAL_COMMUNITY): Payer: Self-pay | Admitting: *Deleted

## 2013-01-19 DIAGNOSIS — F419 Anxiety disorder, unspecified: Secondary | ICD-10-CM

## 2013-01-19 MED ORDER — CITALOPRAM HYDROBROMIDE 40 MG PO TABS: 40.0000 mg | ORAL_TABLET | Freq: Every day | ORAL | Status: DC

## 2013-01-27 ENCOUNTER — Ambulatory Visit: Payer: Self-pay | Admitting: *Deleted

## 2013-02-16 ENCOUNTER — Encounter: Payer: Self-pay | Admitting: *Deleted

## 2013-02-16 ENCOUNTER — Encounter: Payer: Managed Care, Other (non HMO) | Attending: Internal Medicine | Admitting: *Deleted

## 2013-02-16 VITALS — Ht 62.0 in | Wt 113.7 lb

## 2013-02-16 DIAGNOSIS — E1065 Type 1 diabetes mellitus with hyperglycemia: Secondary | ICD-10-CM

## 2013-02-16 DIAGNOSIS — Z713 Dietary counseling and surveillance: Secondary | ICD-10-CM | POA: Insufficient documentation

## 2013-02-16 DIAGNOSIS — E109 Type 1 diabetes mellitus without complications: Secondary | ICD-10-CM | POA: Insufficient documentation

## 2013-02-16 DIAGNOSIS — IMO0002 Reserved for concepts with insufficient information to code with codable children: Secondary | ICD-10-CM

## 2013-02-16 NOTE — Progress Notes (Signed)
  Medical Nutrition Therapy:  Appt start time: 1200 end time:  1300.  Assessment:  Primary concerns today: diabetes Type 1 for past 15 years, diagnosed in 1996. Lives alone. Not working right now, has Masters in [employer]. History of DM1 for about 20 years. She is on intensive insulin regime with Lantus in AM along with Humalog with a Carb Ratio and Correction Factor. She states she appreciates her parent's support however feels they overwhelm her with concern when she doesn't respond to their texts or phone calls immediately.   MEDICATIONS: see list. Taking Lantus in AM now, Humalog is Sliding scale with Carb ration and correction factor   DIETARY INTAKE:  Usual eating pattern includes 3 meals and 1-2 snacks per day.  Everyday foods include some variety of all food groups in small portions.  Avoided foods include high calorie foods.    24-hr recall:  B ( AM): greek yogurt OR waffles OR cereal, 2 % milk, water or unsweet tea  Snk ( AM): just if has low BG  L ( PM): at home, sandwich with [place] or left overs, water or tea Snk ( PM): cheese crackers or another yogurt D ( PM): varies daily; cooks or eats out, chicken, vegetables, starch, same beverages Snk ( PM): greek yogurt  Beverages: water, unsweet tea  Usual physical activity: walks some, has some neuropathy, gabapentin is helping some.  Estimated energy needs: 1600 calories 180 g carbohydrates 120 g protein 44 g fat  Progress Towards Goal(s):  In progress.   Nutritional Diagnosis:  NB-1.1 Food and nutrition-related knowledge deficit As related to diabetes control.  As evidenced by A1c of 8.5% which needs to be noted that it is improved over an average of over 10% for past 4 years.    Intervention:  Nutrition counseling and diabetes education initiated. Discussed basic physiology of diabetes, SMBG and rationale of checking BG at alternate times of day, A1c, Carb Counting and reading food labels, and benefits of increased activity.  Discussed insulin action and benefits of her intensive insulin regime to give her more power over her BGs. Provided explanation of transition to insulin pump, benefits of pump delivery of analog insulin typically resulting in better control while improving quality of life. Also discussed benefits of sensor technology. Also suggested she discuss a communication plan with her parents to provide them security of her safety while also not infringing on her own life.  Plan:  Consider looking for patterns both pre and post meal as well as overnight from your BG APP Consider checking out pump and sensor websites:  [email or web address]  (check out Guardian sensor product too)  [email or web address]  [email or web address] I will let you know as we get the Support Group started this fall.  Handouts given during visit include:  Medtronic Pump Brochure and websites for other pump and sensor products  Monitoring/Evaluation:  Dietary intake, exercise, SMBG, and body weight prn. Patient will call if any questions or concerns.

## 2013-02-16 NOTE — Patient Instructions (Signed)
Plan:  Consider looking for patterns both pre and post meal as well as overnight from your BG APP Consider checking out pump and sensor websites:  [email or web address]  (check out Guardian sensor product too)  [email or web address]  [email or web address] I will let you know as we get the Support Group started this fall.

## 2013-02-17 ENCOUNTER — Encounter (HOSPITAL_COMMUNITY): Payer: Self-pay | Admitting: Emergency Medicine

## 2013-02-17 ENCOUNTER — Emergency Department (HOSPITAL_COMMUNITY)
Admission: EM | Admit: 2013-02-17 | Discharge: 2013-02-18 | Disposition: A | Discharge status: Home / Self Care | Payer: Managed Care, Other (non HMO) | Attending: Emergency Medicine | Admitting: Emergency Medicine

## 2013-02-17 DIAGNOSIS — E162 Hypoglycemia, unspecified: Secondary | ICD-10-CM

## 2013-02-17 DIAGNOSIS — Z8639 Personal history of other endocrine, nutritional and metabolic disease: Secondary | ICD-10-CM | POA: Insufficient documentation

## 2013-02-17 DIAGNOSIS — R4182 Altered mental status, unspecified: Secondary | ICD-10-CM | POA: Insufficient documentation

## 2013-02-17 DIAGNOSIS — IMO0002 Reserved for concepts with insufficient information to code with codable children: Secondary | ICD-10-CM | POA: Insufficient documentation

## 2013-02-17 DIAGNOSIS — R6883 Chills (without fever): Secondary | ICD-10-CM | POA: Insufficient documentation

## 2013-02-17 DIAGNOSIS — R680 Hypothermia, not associated with low environmental temperature: Secondary | ICD-10-CM | POA: Insufficient documentation

## 2013-02-17 DIAGNOSIS — T68XXXA Hypothermia, initial encounter: Secondary | ICD-10-CM

## 2013-02-17 DIAGNOSIS — Z79899 Other long term (current) drug therapy: Secondary | ICD-10-CM | POA: Insufficient documentation

## 2013-02-17 DIAGNOSIS — Z794 Long term (current) use of insulin: Secondary | ICD-10-CM | POA: Insufficient documentation

## 2013-02-17 DIAGNOSIS — Z862 Personal history of diseases of the blood and blood-forming organs and certain disorders involving the immune mechanism: Secondary | ICD-10-CM | POA: Insufficient documentation

## 2013-02-17 DIAGNOSIS — E1169 Type 2 diabetes mellitus with other specified complication: Secondary | ICD-10-CM | POA: Insufficient documentation

## 2013-02-17 LAB — GLUCOSE, CAPILLARY: Glucose-Capillary: 169 mg/dL — ABNORMAL HIGH (ref 70–99)

## 2013-02-17 LAB — POCT I-STAT, CHEM 8
Chloride: 102 mEq/L (ref 96–112)
Creatinine, Ser: 0.7 mg/dL (ref 0.50–1.10)
Glucose, Bld: 158 mg/dL — ABNORMAL HIGH (ref 70–99)
Potassium: 5.1 mEq/L (ref 3.5–5.1)

## 2013-02-17 NOTE — ED Notes (Signed)
Pt responds appropriately to questions and commands. Pt a/o x 4. Denies nausea. Pt states she has a little pain in her back.

## 2013-02-17 NOTE — ED Notes (Addendum)
Per PA, Dammen, pt. Temp low 94.6 rectally, bair hugger applied.

## 2013-02-17 NOTE — ED Notes (Signed)
POC cbg 169mg /dl.

## 2013-02-17 NOTE — ED Provider Notes (Signed)
History    CSN: [id number] Arrival date & time 02/17/13  2220  First MD Initiated Contact with Patient 02/17/13 2259     Chief Complaint  Patient presents with  . Hypoglycemia   HPI  History provided by the patient and friend. Patient is a 32 year old female with history of Addison disease and diabetes who presents with altered mental status and hypoglycemia. Patient was found by her friend in bed with contractures of the body and unresponsive. EMS was called and patient had a blood sugar of 30. She was immediately given 1 amp of D50 with improvement of her blood sugar. Patient is more arousable at that time as well. Patient has had similar episode recently with hospital admission. Patient is unable to say when or how much was her last insulin dose. She does recall using her normal Lantus dose this morning. She does report not having very much to eat today. Denies feeling sick. No episodes of vomiting or diarrhea. No recent fevers. Patient has some increased stress with unemployment but denies feeling significantly depressed and adamantly denies SI. No other aggravating or alleviating factors. No other associated symptoms.   Past Medical History  Diagnosis Date  . Diabetes mellitus   . Addison disease   . Allergy   . Addison disease   . Thyroid disease    Past Surgical History  Procedure Laterality Date  . Wisdom tooth extraction     Family History  Problem Relation Age of Onset  . Alzheimer's disease Maternal Grandmother   . Diabetes Paternal Grandmother   . Cancer Paternal Grandfather    History  Substance Use Topics  . Smoking status: Never Smoker   . Smokeless tobacco: Never Used  . Alcohol Use: 1.2 oz/week    2 Cans of beer per week     Comment: occasionally less than once a month, mixed drink   OB History   Grav Para Term Preterm Abortions TAB SAB Ect Mult Living                 Review of Systems  Constitutional: Positive for chills. Negative for fever and  diaphoresis.  Respiratory: Negative for shortness of breath.   Cardiovascular: Negative for chest pain.  Gastrointestinal: Negative for vomiting, abdominal pain and diarrhea.  All other systems reviewed and are negative.    Allergies  Augmentin  Home Medications   Current Outpatient Rx  Name  Route  Sig  Dispense  Refill  . cetirizine (ZYRTEC) 10 MG tablet   Oral   Take 10 mg by mouth daily.         . citalopram (CELEXA) 40 MG tablet   Oral   Take 1 tablet (40 mg total) by mouth daily.   30 tablet   0   . fludrocortisone (FLORINEF) 0.1 MG tablet   Oral   Take 0.05 mg by mouth daily.         Marland Kitchen HUMALOG 100 UNIT/ML injection               . hydrocortisone (CORTEF) 10 MG tablet   Oral   Take 1 tablet (10 mg total) by mouth 2 (two) times daily.   60 tablet   0   . insulin glargine (LANTUS) 100 UNIT/ML injection   Subcutaneous   Inject 19 Units into the skin daily.          . norethindrone (ERRIN) 0.35 MG tablet   Oral   Take 1 tablet (0.35 mg total) by  mouth daily.   1 Package   11   . ondansetron (ZOFRAN ODT) 4 MG disintegrating tablet   Oral   Take 1 tablet (4 mg total) by mouth every 8 (eight) hours as needed for nausea.   15 tablet   0   . ramipril (ALTACE) 2.5 MG capsule   Oral   Take 2.5 mg by mouth daily.         Marland Kitchen rOPINIRole (REQUIP) 0.25 MG tablet   Oral   Take 0.25 mg by mouth at bedtime.           BP 107/70  Pulse 68  Resp 16  SpO2 100% Physical Exam  Nursing note and vitals reviewed. Constitutional: She is oriented to person, place, and time. She appears well-developed and well-nourished. No distress.  HENT:  Head: Normocephalic.  Eyes: Conjunctivae and EOM are normal. Pupils are equal, round, and reactive to light.  Cardiovascular: Normal rate and regular rhythm.   Pulmonary/Chest: Effort normal and breath sounds normal. No respiratory distress. She has no wheezes. She has no rales.  Abdominal: Soft.  Musculoskeletal:  Normal range of motion.  Neurological: She is oriented to person, place, and time. She has normal strength. No cranial nerve deficit or sensory deficit. GCS eye subscore is 3. GCS verbal subscore is 5. GCS motor subscore is 6.  Drowsy but awakes to voice command  Skin: Skin is warm and dry. No rash noted.  Psychiatric: She has a normal mood and affect. Her behavior is normal.    ED Course  Procedures   Results for orders placed during the hospital encounter of 02/17/13  GLUCOSE, CAPILLARY      Result Value Range   Glucose-Capillary 169 (*) 70 - 99 mg/dL   Comment 1 Notify RN    CBC WITH DIFFERENTIAL      Result Value Range   WBC 6.3  4.0 - 10.5 K/uL   RBC 4.56  3.87 - 5.11 MIL/uL   Hemoglobin 14.0  12.0 - 15.0 g/dL   HCT 78.2  95.6 - 21.3 %   MCV 86.4  78.0 - 100.0 fL   MCH 30.7  26.0 - 34.0 pg   MCHC 35.5  30.0 - 36.0 g/dL   RDW 08.6  57.8 - 46.9 %   Platelets 284  150 - 400 K/uL   Neutrophils Relative % 54  43 - 77 %   Neutro Abs 3.4  1.7 - 7.7 K/uL   Lymphocytes Relative 35  12 - 46 %   Lymphs Abs 2.2  0.7 - 4.0 K/uL   Monocytes Relative 7  3 - 12 %   Monocytes Absolute 0.5  0.1 - 1.0 K/uL   Eosinophils Relative 4  0 - 5 %   Eosinophils Absolute 0.2  0.0 - 0.7 K/uL   Basophils Relative 0  0 - 1 %   Basophils Absolute 0.0  0.0 - 0.1 K/uL  URINE RAPID DRUG SCREEN (HOSP PERFORMED)      Result Value Range   Opiates NONE DETECTED  NONE DETECTED   Cocaine NONE DETECTED  NONE DETECTED   Benzodiazepines NONE DETECTED  NONE DETECTED   Amphetamines NONE DETECTED  NONE DETECTED   Tetrahydrocannabinol NONE DETECTED  NONE DETECTED   Barbiturates NONE DETECTED  NONE DETECTED  ETHANOL      Result Value Range   Alcohol, Ethyl (B) <11  0 - 11 mg/dL  GLUCOSE, CAPILLARY      Result Value Range   Glucose-Capillary 148 (*)  70 - 99 mg/dL   Comment 1 Notify RN    GLUCOSE, CAPILLARY      Result Value Range   Glucose-Capillary 137 (*) 70 - 99 mg/dL   Comment 1 Notify RN    GLUCOSE,  CAPILLARY      Result Value Range   Glucose-Capillary 122 (*) 70 - 99 mg/dL   Comment 1 Notify RN    GLUCOSE, CAPILLARY      Result Value Range   Glucose-Capillary 184 (*) 70 - 99 mg/dL   Comment 1 Notify RN    POCT I-STAT, CHEM 8      Result Value Range   Sodium 137  135 - 145 mEq/L   Potassium 5.1  3.5 - 5.1 mEq/L   Chloride 102  96 - 112 mEq/L   BUN 16  6 - 23 mg/dL   Creatinine, Ser 1.61  0.50 - 1.10 mg/dL   Glucose, Bld 096 (*) 70 - 99 mg/dL   Calcium, Ion 0.45  1.12 - 1.23 mmol/L   TCO2 25  0 - 100 mmol/L   Hemoglobin 15.0  12.0 - 15.0 g/dL   HCT 40.9  81.1 - 91.4 %         1. Hypoglycemia   2. Hypothermia, initial encounter      MDM  11:10 PM patient seen and evaluated. Patient appears drowsy but does awake to voice command. She is oriented x3. She is having some issues with short-term memory. Does not recall the last time or amount of insulin use. She did take her normal Lantus in the morning.   11:30PM Pt is hypothermic with rectal temp 94.  Bear hugger ordered with warming blankets.   Patient has continued to have improvement of temperature and feels well. Her blood sugar has general maintained around 100 without significant drop.  Patient was discussed with attending physician. At this time is felt she is stable to return home and followup with PCP and endocrinologist.  Angus Seller, PA-C 02/18/13 [phone]

## 2013-02-17 NOTE — ED Notes (Signed)
[id number]<AV> Expected date:<BR> Expected time:<BR> Means of arrival:<BR> Comments:<BR> EMS, 32yo F; Hypoglycemia BS 30 upon arrival; medication admin; now alert

## 2013-02-17 NOTE — ED Notes (Signed)
Pt BIB EMS. Pt was found at her home unresponsive by a friend. Pt had blood sugar 30mg /dl per EMS. Pt was exhibiting decorticate posturing per EMS. Pt was given 1amp D50 per EMS. Last blood sugar 146 mg/dl. Pt is a/o x 3 per EMS. Pt has similar episode last Monday. Pt recently diagnosed with hypothyroidism per EMS.

## 2013-02-18 ENCOUNTER — Ambulatory Visit (HOSPITAL_COMMUNITY): Payer: Self-pay | Admitting: Psychiatry

## 2013-02-18 LAB — CBC WITH DIFFERENTIAL/PLATELET
Basophils Absolute: 0 10*3/uL (ref 0.0–0.1)
Basophils Relative: 0 % (ref 0–1)
Eosinophils Absolute: 0.2 10*3/uL (ref 0.0–0.7)
Eosinophils Relative: 4 % (ref 0–5)
Lymphs Abs: 2.2 10*3/uL (ref 0.7–4.0)
MCH: 30.7 pg (ref 26.0–34.0)
MCHC: 35.5 g/dL (ref 30.0–36.0)
MCV: 86.4 fL (ref 78.0–100.0)
Neutrophils Relative %: 54 % (ref 43–77)
Platelets: 284 10*3/uL (ref 150–400)
RDW: 12.6 % (ref 11.5–15.5)

## 2013-02-18 LAB — RAPID URINE DRUG SCREEN, HOSP PERFORMED
Opiates: NOT DETECTED
Tetrahydrocannabinol: NOT DETECTED

## 2013-02-18 LAB — GLUCOSE, CAPILLARY: Glucose-Capillary: 122 mg/dL — ABNORMAL HIGH (ref 70–99)

## 2013-02-18 MED ORDER — HYDROCORTISONE SOD SUCCINATE 100 MG IJ SOLR
100.0000 mg | Freq: Once | INTRAMUSCULAR | Status: AC
  Administered 2013-02-18: 100 mg via INTRAVENOUS
  Filled 2013-02-18: qty 2

## 2013-02-18 NOTE — ED Notes (Signed)
PA,Dammen made aware pt. Temp. 95.4 rectally.

## 2013-02-18 NOTE — ED Provider Notes (Signed)
Medical screening examination/treatment/procedure(s) were performed by non-physician practitioner and as supervising physician I was immediately available for consultation/collaboration.   Lyanne Co, MD 02/18/13 [phone]

## 2013-02-22 ENCOUNTER — Other Ambulatory Visit (HOSPITAL_COMMUNITY): Payer: Self-pay | Admitting: *Deleted

## 2013-02-22 DIAGNOSIS — F419 Anxiety disorder, unspecified: Secondary | ICD-10-CM

## 2013-02-22 MED ORDER — CITALOPRAM HYDROBROMIDE 40 MG PO TABS: 40.0000 mg | ORAL_TABLET | Freq: Every day | ORAL | Status: DC

## 2013-02-24 ENCOUNTER — Emergency Department (HOSPITAL_COMMUNITY)
Admission: EM | Admit: 2013-02-24 | Discharge: 2013-02-24 | Disposition: A | Discharge status: Home / Self Care | Payer: Managed Care, Other (non HMO) | Attending: Emergency Medicine | Admitting: Emergency Medicine

## 2013-02-24 DIAGNOSIS — E1169 Type 2 diabetes mellitus with other specified complication: Secondary | ICD-10-CM | POA: Insufficient documentation

## 2013-02-24 DIAGNOSIS — E079 Disorder of thyroid, unspecified: Secondary | ICD-10-CM | POA: Insufficient documentation

## 2013-02-24 DIAGNOSIS — IMO0002 Reserved for concepts with insufficient information to code with codable children: Secondary | ICD-10-CM | POA: Insufficient documentation

## 2013-02-24 DIAGNOSIS — Z794 Long term (current) use of insulin: Secondary | ICD-10-CM | POA: Insufficient documentation

## 2013-02-24 DIAGNOSIS — E2749 Other adrenocortical insufficiency: Secondary | ICD-10-CM | POA: Insufficient documentation

## 2013-02-24 DIAGNOSIS — Z79899 Other long term (current) drug therapy: Secondary | ICD-10-CM | POA: Insufficient documentation

## 2013-02-24 DIAGNOSIS — E162 Hypoglycemia, unspecified: Secondary | ICD-10-CM

## 2013-02-24 DIAGNOSIS — E119 Type 2 diabetes mellitus without complications: Secondary | ICD-10-CM

## 2013-02-24 LAB — GLUCOSE, CAPILLARY: Glucose-Capillary: 116 mg/dL — ABNORMAL HIGH (ref 70–99)

## 2013-02-24 LAB — URINALYSIS, ROUTINE W REFLEX MICROSCOPIC
Bilirubin Urine: NEGATIVE
Hgb urine dipstick: NEGATIVE
Ketones, ur: NEGATIVE mg/dL
Nitrite: NEGATIVE
Urobilinogen, UA: 0.2 mg/dL (ref 0.0–1.0)
pH: 5.5 (ref 5.0–8.0)

## 2013-02-24 LAB — BASIC METABOLIC PANEL
BUN: 14 mg/dL (ref 6–23)
Chloride: 96 mEq/L (ref 96–112)
Creatinine, Ser: 0.54 mg/dL (ref 0.50–1.10)
GFR calc Af Amer: >90 mL/min (ref >90)
Glucose, Bld: 325 mg/dL — ABNORMAL HIGH (ref 70–99)

## 2013-02-24 LAB — URINE MICROSCOPIC-ADD ON

## 2013-02-24 NOTE — ED Notes (Signed)
Pt a/o x 4. Pt sitting up in bed and conversing with family. Pt denies any pain. Pt given sandwich and soda pop.

## 2013-02-24 NOTE — ED Notes (Signed)
Pt BIB EMS. Pt was found lying in bed unresponsive by friend. Friend called EMS. Pt's initial blood sugar was 19 mg/dl per EMS. Pt was given 1 amp D-10 per EMS. Pt's recheck blood sugar was 150mg /dl per EMS. Pt also ate part of a peanut butter and jelly sandwich en route. Pt told EMS she didn't think she took her insulin this morning. Pt arrives a/o x 4. Pt with no acute distress.

## 2013-02-24 NOTE — ED Notes (Signed)
D/c home with ride- pt received paperwork and signed but e-sig not captured by epic

## 2013-02-24 NOTE — ED Notes (Signed)
[id number]> Expected date:<BR> Expected time:<BR> Means of arrival:<BR> Comments:<BR> EMS-hypoglycemia

## 2013-02-24 NOTE — ED Provider Notes (Signed)
History    CSN: [id number] Arrival date & time 02/24/13  1754  First MD Initiated Contact with Patient 02/24/13 1819     Chief Complaint  Patient presents with  . Hypoglycemia   (Consider location/radiation/quality/duration/timing/severity/associated sxs/prior Treatment) Patient is a 32 y.o. female presenting with hypoglycemia. The history is provided by the patient. No language interpreter was used.  Hypoglycemia Associated symptoms comment:  She was found unresponsive by a friend this afternoon who called EMS. Her blood sugar was found to be 19. She was given amp D-50 in route and is awake and alert here. No pain. She reports having multiple episodes of hypoglycemia in the last couple of weeks. She denies missing any of her medications, but reports her doctor changed the timing of her insulin dosing from p.m. to a.m. And she feels this has contributed to her instability. She states she went back to her old regimen 5 days ago.   Past Medical History  Diagnosis Date  . Diabetes mellitus   . Addison disease   . Allergy   . Addison disease   . Thyroid disease    Past Surgical History  Procedure Laterality Date  . Wisdom tooth extraction     Family History  Problem Relation Age of Onset  . Alzheimer's disease Maternal Grandmother   . Diabetes Paternal Grandmother   . Cancer Paternal Grandfather    History  Substance Use Topics  . Smoking status: Never Smoker   . Smokeless tobacco: Never Used  . Alcohol Use: 1.2 oz/week    2 Cans of beer per week     Comment: occasionally less than once a month, mixed drink   OB History   Grav Para Term Preterm Abortions TAB SAB Ect Mult Living                 Review of Systems  Constitutional: Negative for fever and chills.  HENT: Negative.   Respiratory: Negative.   Cardiovascular: Negative.   Gastrointestinal: Negative.   Musculoskeletal: Negative.   Skin: Negative.   Neurological:       See HPI.    Allergies   Augmentin  Home Medications   Current Outpatient Rx  Name  Route  Sig  Dispense  Refill  . cetirizine (ZYRTEC) 10 MG tablet   Oral   Take 10 mg by mouth daily.         . citalopram (CELEXA) 40 MG tablet   Oral   Take 1 tablet (40 mg total) by mouth daily.   30 tablet   0   . fludrocortisone (FLORINEF) 0.1 MG tablet   Oral   Take 0.05 mg by mouth daily.         Marland Kitchen gabapentin (NEURONTIN) 100 MG capsule   Oral   Take 100-300 mg by mouth at bedtime.          Marland Kitchen HUMALOG 100 UNIT/ML injection   Subcutaneous   Inject 1-10 Units into the skin 3 (three) times daily before meals. Per sliding scale         . hydrocortisone (CORTEF) 10 MG tablet   Oral   Take 1 tablet (10 mg total) by mouth 2 (two) times daily.   60 tablet   0   . insulin glargine (LANTUS) 100 UNIT/ML injection   Subcutaneous   Inject 18 Units into the skin daily.          Marland Kitchen l-methylfolate-B6-B12 (METANX) 3-35-2 MG TABS   Oral   Take 1 tablet  by mouth daily.         Marland Kitchen levothyroxine (SYNTHROID, LEVOTHROID) 75 MCG tablet   Oral   Take 37.5 mcg by mouth daily before breakfast.         . norethindrone (ERRIN) 0.35 MG tablet   Oral   Take 1 tablet (0.35 mg total) by mouth daily.   1 Package   11   . ramipril (ALTACE) 2.5 MG capsule   Oral   Take 2.5 mg by mouth daily.         Marland Kitchen rOPINIRole (REQUIP) 0.25 MG tablet   Oral   Take 0.25 mg by mouth at bedtime.           BP 105/79  Pulse 72  Temp(Src) 97.6 F (36.4 C) (Oral)  Resp 16  Wt 115 lb (52.164 kg)  BMI 21.03 kg/m2  SpO2 100% Physical Exam  Constitutional: She is oriented to person, place, and time. She appears well-developed and well-nourished.  HENT:  Head: Normocephalic.  Neck: Normal range of motion. Neck supple.  Cardiovascular: Normal rate and regular rhythm.   Pulmonary/Chest: Effort normal and breath sounds normal.  Abdominal: Soft. Bowel sounds are normal. There is no tenderness. There is no rebound and no  guarding.  Musculoskeletal: Normal range of motion.  Neurological: She is alert and oriented to person, place, and time.  Skin: Skin is warm and dry. No rash noted.  Psychiatric: She has a normal mood and affect.    ED Course  Procedures (including critical care time) Labs Reviewed  GLUCOSE, CAPILLARY - Abnormal; Notable for the following:    Glucose-Capillary 116 (*)    All other components within normal limits  URINALYSIS, ROUTINE W REFLEX MICROSCOPIC  BASIC METABOLIC PANEL   No results found. No diagnosis found.  MDM  She is sitting up in bed, conversing with family at bedside, eating, NAD.   Arnoldo Hooker, PA-C 02/24/13 2016

## 2013-02-25 NOTE — ED Provider Notes (Signed)
Medical screening examination/treatment/procedure(s) were conducted as a shared visit with non-physician practitioner(s) or resident  and myself.  I personally evaluated the patient during the encounter and agree with the findings and plan unless otherwise indicated.  Pt with complex endocrine history, recent medicine changes and she does have fup with endo.l  Recently pt had change in timing of taking lantus and also on thyroid medicines.  Pt had hypoglycemic episode today, multiple recently.  Pt normal in ED, tolerated meal.  Long discussion regarding decreasing lantus dose, close fup with endocrinology and regular phone calls from mother twice daily to ensure okay.  Mother in the room, both comfortable with plan.   DC  Enid Skeens, MD 02/25/13 [phone]

## 2013-02-26 ENCOUNTER — Encounter (HOSPITAL_COMMUNITY): Payer: Self-pay | Admitting: Psychiatry

## 2013-02-26 ENCOUNTER — Ambulatory Visit (INDEPENDENT_AMBULATORY_CARE_PROVIDER_SITE_OTHER): Payer: Commercial Indemnity | Admitting: Psychiatry

## 2013-02-26 VITALS — BP 100/74 | HR 88 | Ht 62.0 in | Wt 114.0 lb

## 2013-02-26 DIAGNOSIS — F411 Generalized anxiety disorder: Secondary | ICD-10-CM

## 2013-02-26 DIAGNOSIS — F3289 Other specified depressive episodes: Secondary | ICD-10-CM

## 2013-02-26 DIAGNOSIS — F329 Major depressive disorder, single episode, unspecified: Secondary | ICD-10-CM

## 2013-02-26 DIAGNOSIS — F419 Anxiety disorder, unspecified: Secondary | ICD-10-CM

## 2013-02-26 MED ORDER — VENLAFAXINE HCL ER 37.5 MG PO CP24: ORAL_CAPSULE | ORAL | Status: DC

## 2013-02-26 NOTE — Progress Notes (Signed)
[facility] [id number] Progress Note  [name] [id number] 32 y.o.  02/26/2013 11:48 AM  Chief Complaint: I am not feeling better.  My Celexa is not working.  History of Present Illness: Patient is a 32 year old Caucasian single female who came for her followup appointment.  She was seen first time on November 10 2012.  Patient missed appointment.  She was recommended to increase Celexa 40 mg.  She has been taking 40 mg but does not feel any improvement.  She feels isolated withdrawn with decreased energy and increased anxiety.  She is sleeping too much.  She denies any severe panic attack however admitted lack of motivation to do things.  Last week she has friends baby shower however she did not go.  Patient was seen in the emergency room a few days ago because of low blood sugar.  Overall her diabetes is better.  She remember her last hemoglobin A1c 8.5.  Patient admitted chronic feeling of worthlessness and hopelessness.  She admitted some crying spells.  She has not seeing therapist.  She could not afford counseling.  Patient wants to try a different medication.  She denies any hallucination or any active or passive suicidal thoughts.  Suicidal Ideation: No Plan Formed: No Patient has means to carry out plan: No  Homicidal Ideation: No Plan Formed: No Patient has means to carry out plan: No  Review of Systems: Psychiatric: Agitation: No Hallucination: No Depressed Mood: Yes Insomnia: No Hypersomnia: Yes Altered Concentration: No Feels Worthless: Yes Grandiose Ideas: No Belief In Special Powers: No New/Increased Substance Abuse: No Compulsions: No  Neurologic: Headache: No Seizure: No Paresthesias: No  Medical History:  Patient has insulin dependent diabetes since age 32  She is also diagnosed with Addison disease 7 years ago.  She had a sleep study and diagnosed with restless leg syndrome.  She goes to [facility] urgent care.  She sees Dr. Allena Katz at [facility] for the  management of diabetes.  Family history. Patient denies any family history of psychiatric illness.  Alcohol and substance use history. Patient denies any history of alcohol or any illegal substance use.  Education and work history. 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  Psychosocial history. Patient born in [place] and grew up here.  Patient denies any history of physical sexual verbal and emotional abuse.  Her brother and parents live in [place].  Patient lives by herself.  She is not in any relationship.  She has limited social network.  Outpatient Encounter Prescriptions as of 02/26/2013  Medication Sig Dispense Refill  . cetirizine (ZYRTEC) 10 MG tablet Take 10 mg by mouth daily.      . citalopram (CELEXA) 40 MG tablet Take 1 tablet (40 mg total) by mouth daily.  30 tablet  0  . fludrocortisone (FLORINEF) 0.1 MG tablet Take 0.05 mg by mouth daily.      Marland Kitchen gabapentin (NEURONTIN) 100 MG capsule Take 100-300 mg by mouth at bedtime.       Marland Kitchen HUMALOG 100 UNIT/ML injection Inject 1-10 Units into the skin 3 (three) times daily before meals. Per sliding scale      . hydrocortisone (CORTEF) 10 MG tablet Take 1 tablet (10 mg total) by mouth 2 (two) times daily.  60 tablet  0  . insulin glargine (LANTUS) 100 UNIT/ML injection Inject 18 Units into the skin daily.       Marland Kitchen  l-methylfolate-B6-B12 (METANX) 3-35-2 MG TABS Take 1 tablet by mouth daily.      Marland Kitchen levothyroxine (SYNTHROID, LEVOTHROID) 75 MCG tablet Take 37.5 mcg by mouth daily before breakfast.      . norethindrone (ERRIN) 0.35 MG tablet Take 1 tablet (0.35 mg total) by mouth daily.  1 Package  11  . ramipril (ALTACE) 2.5 MG capsule Take 2.5 mg by mouth daily.      Marland Kitchen rOPINIRole (REQUIP) 0.25 MG tablet Take 0.25 mg by mouth at bedtime.       Marland Kitchen venlafaxine XR (EFFEXOR XR) 37.5 MG 24 hr capsule Take 1 capsule for 1  week and than 2 daily  60 capsule  0   No facility-administered encounter medications on file as of 02/26/2013.    Past Psychiatric History/Hospitalization(s): Patient has tried Zoloft for 5 years given by primary care physician under the stop.  She has tried Wellbutrin however after one week she started to have jitteriness and she stopped it Anxiety: Yes Bipolar Disorder: No Depression: Yes Mania: No Psychosis: No Schizophrenia: No Personality Disorder: No Hospitalization for psychiatric illness: No History of Electroconvulsive Shock Therapy: No Prior Suicide Attempts: No  Physical Exam: Constitutional:  BP 100/74  Pulse 88  Ht 5\' 2"  (1.575 m)  Wt 114 lb (51.71 kg)  BMI 20.85 kg/m2  General Appearance: alert, oriented, no acute distress and well nourished  Musculoskeletal: Strength & Muscle Tone: within normal limits Gait & Station: normal Patient leans: N/A  Psychiatric: Speech (describe rate, volume, coherence, spontaneity, and abnormalities if any): Soft slow with decreased volume and tone.  Thought Process (describe rate, content, abstract reasoning, and computation): Logical goal-directed with normal rate and volume.  Associations: Relevant and Intact  Thoughts: Depressed but no paranoid delusion obsession present.  Mental Status: Orientation: oriented to person, place, time/date and situation Mood & Affect: depressed affect and anxiety Attention Span & Concentration: Fair  Medical Decision Making (Choose Three): Review of Psycho-Social Stressors (1), Review and summation of old records (2), Established Problem, Worsening (2), Review of Last Therapy Session (1), Review of Medication Regimen & Side Effects (2) and Review of New Medication or Change in Dosage (2)  Assessment: Axis I: Depressive disorder NOS  Axis II: Deferred  Axis III: See medical history  Axis IV: Moderate  Axis V: 60   Plan: I review her symptoms, recent visit to the emergency room,  recent blood work and current medication.  I do not believe her Celexa is working very well.  Patient still has a lot of symptoms of depression and anxiety.  She has never tried Effexor.  We will try Effexor 37.5 mg and greatly increased to 75 mg in one week.  I recommend to taper down Celexa 20 mg for one week and then stop.  Recommend to call us back if she has any questions or concerns.  Discussed in detail the risks and benefits of medication.  Discuss safety plan that anytime having active suicidal thoughts or homicidal thoughts and she called 911 from local emergency room.  Time spent 25 minutes.  More than 50% of the time is spent in psychoeducation, counseling and coordination of care.  Followup in 3 weeks.  At this time patient does not want counseling due to expense.  Salima Rumer T., MD 02/26/2013

## 2013-03-23 ENCOUNTER — Other Ambulatory Visit (HOSPITAL_COMMUNITY): Payer: Self-pay | Admitting: Psychiatry

## 2013-03-24 ENCOUNTER — Other Ambulatory Visit (HOSPITAL_COMMUNITY): Payer: Self-pay | Admitting: Psychiatry

## 2013-03-26 ENCOUNTER — Ambulatory Visit (HOSPITAL_COMMUNITY): Payer: Self-pay | Admitting: Psychiatry

## 2013-03-30 NOTE — Telephone Encounter (Signed)
Needs to be seen

## 2013-04-02 ENCOUNTER — Other Ambulatory Visit (HOSPITAL_COMMUNITY): Payer: Self-pay | Admitting: Psychiatry

## 2013-04-07 ENCOUNTER — Encounter (HOSPITAL_COMMUNITY): Payer: Self-pay | Admitting: Psychiatry

## 2013-04-07 ENCOUNTER — Other Ambulatory Visit (HOSPITAL_COMMUNITY): Payer: Self-pay | Admitting: Psychiatry

## 2013-04-07 ENCOUNTER — Ambulatory Visit (INDEPENDENT_AMBULATORY_CARE_PROVIDER_SITE_OTHER): Payer: Commercial Indemnity | Admitting: Psychiatry

## 2013-04-07 VITALS — BP 121/94 | HR 85 | Ht 62.0 in | Wt 113.0 lb

## 2013-04-07 DIAGNOSIS — F329 Major depressive disorder, single episode, unspecified: Secondary | ICD-10-CM

## 2013-04-07 DIAGNOSIS — F3289 Other specified depressive episodes: Secondary | ICD-10-CM

## 2013-04-07 MED ORDER — VENLAFAXINE HCL ER 75 MG PO CP24: ORAL_CAPSULE | ORAL | Status: DC

## 2013-04-07 NOTE — Progress Notes (Signed)
[facility] [id number] Progress Note  Sherrika Weakland [id number] 32 y.o.  04/07/2013 4:27 PM  Chief Complaint: I am doing better on Effexor.  History of Present Illness: Patient is a 32 year old Caucasian single female who came for her followup appointment.  Patient is taking Effexor 75 mg daily.  She is tolerating her medication and denies any side effects the issues nor taking Celexa.  She continues to have some irritability anger and crying spells but overall her depression and anxiety is much improved.  She admitted some stress from her friend who is very involved in her daily life and giving advices that she does not need her advice.  Patient is sleeping better with Effexor.  She is also taking Neurontin is prescribed by her neurologist.  She denies any recent hallucination , paranoia or any aggression.  She was to continue Effexor however now agreed to that counseling.  She is actively looking for a counselor for coping assertiveness skills.  Suicidal Ideation: No Plan Formed: No Patient has means to carry out plan: No  Homicidal Ideation: No Plan Formed: No Patient has means to carry out plan: No  Review of Systems: Psychiatric: Agitation: No Hallucination: No Depressed Mood: No Insomnia: No Hypersomnia: No Altered Concentration: No Feels Worthless: Yes Grandiose Ideas: No Belief In Special Powers: No New/Increased Substance Abuse: No Compulsions: No  Neurologic: Headache: No Seizure: No Paresthesias: No  Medical History:  Patient has insulin dependent diabetes since age 45.  She is also diagnosed with Addison disease 7 years ago.  She had a sleep study and diagnosed with restless leg syndrome.  She goes to [facility] urgent care.  She sees Dr. Allena Katz at [facility] for the management of diabetes.  Family history. Patient denies any family history of psychiatric illness.  Alcohol and substance use history. Patient denies any history of alcohol or any illegal substance  use.  Education and work history. 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  Psychosocial history. Patient born in [place] and grew up here.  Patient denies any history of physical sexual verbal and emotional abuse.  Her brother and parents live in [place].  Patient lives by herself.  She is not in any relationship.  She has limited social network.  Outpatient Encounter Prescriptions as of 04/07/2013  Medication Sig Dispense Refill  . cetirizine (ZYRTEC) 10 MG tablet Take 10 mg by mouth daily.      . fludrocortisone (FLORINEF) 0.1 MG tablet Take 0.05 mg by mouth daily.      Marland Kitchen gabapentin (NEURONTIN) 100 MG capsule Take 100-300 mg by mouth at bedtime.       Marland Kitchen HUMALOG 100 UNIT/ML injection Inject 1-10 Units into the skin 3 (three) times daily before meals. Per sliding scale      . hydrocortisone (CORTEF) 10 MG tablet Take 1 tablet (10 mg total) by mouth 2 (two) times daily.  60 tablet  0  . insulin glargine (LANTUS) 100 UNIT/ML injection Inject 18 Units into the skin daily.       Marland Kitchen l-methylfolate-B6-B12 (METANX) 3-35-2 MG TABS Take 1 tablet by mouth daily.      Marland Kitchen levothyroxine (SYNTHROID, LEVOTHROID) 75 MCG tablet Take 37.5 mcg by mouth daily before breakfast.      . norethindrone (ERRIN) 0.35 MG tablet Take 1 tablet (0.35 mg total) by mouth daily.  1 Package  11  .  ramipril (ALTACE) 2.5 MG capsule Take 2.5 mg by mouth daily.      Marland Kitchen rOPINIRole (REQUIP) 0.25 MG tablet Take 0.25 mg by mouth at bedtime.       Marland Kitchen venlafaxine XR (EFFEXOR XR) 75 MG 24 hr capsule Take 1 capsule daily  75 capsule  1  . [DISCONTINUED] venlafaxine XR (EFFEXOR XR) 37.5 MG 24 hr capsule Take 1 capsule for 1 week and than 2 daily  60 capsule  0  . [DISCONTINUED] citalopram (CELEXA) 40 MG tablet Take 1 tablet (40 mg total) by mouth daily.  30 tablet  0   No facility-administered encounter medications  on file as of 04/07/2013.    Past Psychiatric History/Hospitalization(s): Patient has tried Zoloft for 5 years given by primary care physician under the stop.  She has tried Wellbutrin however after one week she started to have jitteriness and she stopped it.  Recently she tried Celexa however does not feel it is working.   Anxiety: Yes Bipolar Disorder: No Depression: Yes Mania: No Psychosis: No Schizophrenia: No Personality Disorder: No Hospitalization for psychiatric illness: No History of Electroconvulsive Shock Therapy: No Prior Suicide Attempts: No  Physical Exam: Constitutional:  BP 121/94  Pulse 85  Ht 5\' 2"  (1.575 m)  Wt 113 lb (51.256 kg)  BMI 20.66 kg/m2  General Appearance: alert, oriented, no acute distress and well nourished  Musculoskeletal: Strength & Muscle Tone: within normal limits Gait & Station: normal Patient leans: N/A  Psychiatric: Speech (describe rate, volume, coherence, spontaneity, and abnormalities if any): Soft slow with decreased volume and tone.  Thought Process (describe rate, content, abstract reasoning, and computation): Logical goal-directed with normal rate and volume.  Associations: Relevant and Intact  Thoughts: Depressed but no paranoid delusion obsession present.  Mental Status: Orientation: oriented to person, place, time/date and situation Mood & Affect: depressed affect and anxiety Attention Span & Concentration: Fair  Medical Decision Making (Choose Three): Established Problem, Stable/Improving (1), Review of Psycho-Social Stressors (1), Review of Last Therapy Session (1) and Review of New Medication or Change in Dosage (2)  Assessment: Axis I: Depressive disorder NOS  Axis II: Deferred  Axis III: See medical history  Axis IV: Moderate  Axis V: 60   Plan:  I will continue Effexor 75 mg daily.  Recommend to call us back if she has any question or any concern.  Due to schedule appointment with therapist Chauna Osoria in this office for coping and social skills.  Followup in 6 weeks.  Jordyn Doane T., MD 04/07/2013

## 2013-04-21 ENCOUNTER — Ambulatory Visit (HOSPITAL_COMMUNITY): Payer: Self-pay | Admitting: Psychiatry

## 2013-05-04 ENCOUNTER — Ambulatory Visit (INDEPENDENT_AMBULATORY_CARE_PROVIDER_SITE_OTHER): Payer: Commercial Indemnity | Admitting: Psychiatry

## 2013-05-04 DIAGNOSIS — F329 Major depressive disorder, single episode, unspecified: Secondary | ICD-10-CM

## 2013-05-04 DIAGNOSIS — F3289 Other specified depressive episodes: Secondary | ICD-10-CM

## 2013-05-04 DIAGNOSIS — F32A Depression, unspecified: Secondary | ICD-10-CM

## 2013-05-05 ENCOUNTER — Encounter (HOSPITAL_COMMUNITY): Payer: Self-pay | Admitting: Psychiatry

## 2013-05-05 NOTE — Progress Notes (Signed)
Patient ID: [name], female   DOB: 1981-03-31, 32 y.o.   MRN: [id number] Presenting Problem Chief Complaint: anxiety, depression  What are the main stressors in your life right now, how long? Unemployment, feeling dependent on parental support  Previous mental health services Have you ever been treated for a mental health problem, when, where, by whom? No    Are you currently seeing a therapist or counselor, counselor's name? No   Have you ever had a mental health hospitalization, how many times, length of stay? No    Have you ever had suicidal thoughts or attempted suicide, when, how? No   Risk factors for Suicide Demographic factors:  Living alone and Unemployed Current mental status:no suicidal ideation reported Loss factors: Decrease in vocational status Historical factors:none Risk Reduction factors: Positive social support Clinical factors: anxiety, depression Cognitive features that contribute to risk: none  SUICIDE RISK:  Minimal: No identifiable suicidal ideation.  Patients presenting with no risk factors but with morbid ruminations; may be classified as minimal risk based on the severity of the depressive symptoms   Social/family history Have you been married, how many times?  none  Do you have children?  none  How many pregnancies have you had?  none  Who lives in your current household? Lives alone  Military history: No   Religious/spiritual involvement:  What religion/faith base are you? deferred  Family of origin (childhood history)  Where were you born? [place] Where did you grow up? [place]  Describe the atmosphere of the household where you grew up: loving, supportive, parents overprotective after type 1 diabetes diagnosis  Do you have siblings? Yes. 1 brother   Are your parents separated/divorced, when and why? No   Are your parents alive? Yes   Social supports (personal and professional): parents  Education How many grades have you  completed? college graduate Did you have any problems in school, what type? No  Medications prescribed for these problems? No   Employment (financial issues) Currently unemployed  Legal history none  Trauma/Abuse history: Have you ever been exposed to any form of abuse, what type? No   Have you ever been exposed to something traumatic, describe? No   Substance use None reported  Mental Status: General Appearance [name]:  Casual Eye Contact:  Good Motor Behavior:  Normal Speech:  Normal Level of Consciousness:  Alert Mood:  Euthymic Affect:  Tearful Anxiety Level:  moderate Thought Process:  Coherent Thought Content:  WNL Perception:  Normal Judgment:  Good Insight:  Present Cognition:  wnl  Diagnosis AXIS I Depressive Disorder NOS  AXIS II No diagnosis  AXIS III Past Medical History  Diagnosis Date  . Diabetes mellitus   . Addison disease   . Allergy   . Addison disease   . Thyroid disease     AXIS IV other psychosocial or environmental problems  AXIS V 51-60 moderate symptoms   Plan: Pt. To return in 2 weeks for continued assessment. Pt. Diagnosed with type 1 diabetes at age 51, and recently blood sugar has been increasingly unstable. Parents tend to be overprotective, and Pt. Feels dependent since recent unemployment. Pt. Reports that she has always had problems with insomnia and waking up for obligations because she does not hear alarm. Pt. Interested in Myers-Briggs and Enneagram personality assessments.  _________________________________________ Boneta Lucks, Ph.D., NCC, [facility]

## 2013-05-19 ENCOUNTER — Ambulatory Visit (HOSPITAL_COMMUNITY): Payer: Self-pay | Admitting: Psychiatry

## 2013-05-24 ENCOUNTER — Ambulatory Visit (HOSPITAL_COMMUNITY): Payer: Self-pay | Admitting: Psychiatry

## 2013-06-10 ENCOUNTER — Ambulatory Visit (INDEPENDENT_AMBULATORY_CARE_PROVIDER_SITE_OTHER): Payer: Managed Care, Other (non HMO) | Admitting: Family Medicine

## 2013-06-10 VITALS — BP 104/68 | HR 124 | Temp 98.1°F | Resp 20 | Ht 62.5 in | Wt 108.8 lb

## 2013-06-10 DIAGNOSIS — E119 Type 2 diabetes mellitus without complications: Secondary | ICD-10-CM

## 2013-06-10 DIAGNOSIS — R000 Tachycardia, unspecified: Secondary | ICD-10-CM

## 2013-06-10 DIAGNOSIS — S61409A Unspecified open wound of unspecified hand, initial encounter: Secondary | ICD-10-CM

## 2013-06-10 DIAGNOSIS — I498 Other specified cardiac arrhythmias: Secondary | ICD-10-CM

## 2013-06-10 DIAGNOSIS — Z23 Encounter for immunization: Secondary | ICD-10-CM

## 2013-06-10 DIAGNOSIS — S61401A Unspecified open wound of right hand, initial encounter: Secondary | ICD-10-CM

## 2013-06-10 MED ORDER — FLUCONAZOLE 150 MG PO TABS: 150.0000 mg | ORAL_TABLET | Freq: Once | ORAL | Status: DC

## 2013-06-10 MED ORDER — MUPIROCIN 2 % EX OINT: 1.0000 "application " | TOPICAL_OINTMENT | Freq: Three times a day (TID) | CUTANEOUS | Status: DC

## 2013-06-10 MED ORDER — CEPHALEXIN 500 MG PO CAPS: 500.0000 mg | ORAL_CAPSULE | Freq: Two times a day (BID) | ORAL | Status: DC

## 2013-06-10 NOTE — Patient Instructions (Signed)
Laceration, Old, Not Sutured A laceration is a cut or lesion that goes through all layers of the skin and into the tissue just beneath the skin. Usually these are stitched up or held together with tape or glue shortly after an injury. However, if several or more hours have passed before getting care, too many germs (bacteria) get into the wound. Stitching it closed at this point brings the risk of infection. If your caregiver feels your laceration is too old, it is sometimes left open and dressed regularly to allow healing from the bottom layer up. HOME CARE INSTRUCTIONS   You should change the dressing twice a day or as instructed by your caregiver. If the bandage or wound packing sticks, soak it off with soapy water. When you redress your wound, make sure that the dressing or packing goes all the way to the bottom of the wound. The top of the wound is kept open so it can heal from the bottom up. There is less chance for infection with this method.  Twice a day, wash the area with soap and water to remove all the creams or ointments if used. Rinse off the soap. Pat dry with a clean towel. Look for signs of infection (see below).  Re-apply creams or ointments if they were used to dress the wound. This also helps keep the bandage from sticking.  If the bandage becomes wet, dirty, or develops a foul smell, change it as soon as possible.  Only take over-the-counter or prescription medicines for pain, discomfort, or fever as directed by your caregiver. You might need a tetanus shot now if:  You have no idea when you had the last one.  You have never had a tetanus shot before.  Your cut had dirt in it.  Your lacertaion was dirty, and your last tetanus shot was more than 7 years ago.  Your laceration was clean, and your last tetanus shot was more than 10 years ago. If you need a tetanus shot, and you decide not to get one, there is a rare chance of getting tetanus. Sickness from tetanus can be  serious. If you got a tetanus shot, your arm may swell, get red and warm to the touch at the shot site. This is common and not a problem. SEEK MEDICAL CARE IF:   There is redness, swelling, or increasing pain in the wound.  There is a red line that goes up your arm or leg.  Pus is coming from wound.  You develop an unexplained oral temperature above 102 F (38.9 C).  You notice a foul smell coming from the wound or dressing.  You notice something coming out of the wound such as wood or glass.  The wound is on your hand or foot and you find that you are unable to properly move a finger or toe.  There is severe swelling around the wound causing pain and numbness.  There is a change in color in your arm, hand, leg, or foot. MAKE SURE YOU:   Understand these instructions.  Will watch your condition.  Will get help right away if you are not doing well or get worse. Document Released: 06/19/2006 Document Revised: 10/14/2011 Document Reviewed: 01/09/2009 [facility] Patient Information 2014 [place], [place]. Nonspecific Tachycardia Tachycardia is a faster than normal heartbeat (more than 100 beats per minute). In adults, the heart normally beats between 60 and 100 times a minute. A fast heartbeat may be a normal response to exercise or stress. It does  not necessarily mean that something is wrong. However, sometimes when your heart beats too fast it may not be able to pump enough blood to the rest of your body. This can result in chest pain, shortness of breath, dizziness, and even fainting. Nonspecific tachycardia means that the specific cause or pattern of your tachycardia is unknown. CAUSES  Tachycardia may be harmless or it may be due to a more serious underlying cause. Possible causes of tachycardia include:  Exercise or exertion.  Fever.  Pain or injury.  Infection.  Loss of body fluids (dehydration).  Overactive thyroid.  Lack of red blood cells (anemia).  Anxiety and  stress.  Alcohol.  Caffeine.  Tobacco products.  Diet pills.  Illegal drugs.  Heart disease. SYMPTOMS  Rapid or irregular heartbeat (palpitations).  Suddenly feeling your heart beating (cardiac awareness).  Dizziness.  Tiredness (fatigue).  Shortness of breath.  Chest pain.  Nausea.  Fainting. DIAGNOSIS  Your caregiver will perform a physical exam and take your medical history. In some cases, a heart specialist (cardiologist) may be consulted. Your caregiver may also order:  Blood tests.  Electrocardiography. This test records the electrical activity of your heart.  A heart monitoring test. TREATMENT  Treatment will depend on the likely cause of your tachycardia. The goal is to treat the underlying cause of your tachycardia. Treatment methods may include:  Replacement of fluids or blood through an intravenous (IV) tube for moderate to severe dehydration or anemia.  New medicines or changes in your current medicines.  Diet and lifestyle changes.  Treatment for certain infections.  Stress relief or relaxation methods. HOME CARE INSTRUCTIONS   Rest.  Drink enough fluids to keep your urine clear or pale yellow.  Do not smoke.  Avoid:  Caffeine.  Tobacco.  Alcohol.  Chocolate.  Stimulants such as over-the-counter diet pills or pills that help you stay awake.  Situations that cause anxiety or stress.  Illegal drugs such as marijuana, phencyclidine (PCP), and cocaine.  Only take medicine as directed by your caregiver.  Keep all follow-up appointments as directed by your caregiver. SEEK IMMEDIATE MEDICAL CARE IF:   You have pain in your chest, upper arms, jaw, or neck.  You become weak, dizzy, or feel faint.  You have palpitations that will not go away.  You vomit, have diarrhea, or pass blood in your stool.  Your skin is cool, pale, and wet.  You have a fever that will not go away with rest, fluids, and medicine. MAKE SURE YOU:    Understand these instructions.  Will watch your condition.  Will get help right away if you are not doing well or get worse. Document Released: 08/29/2004 Document Revised: 10/14/2011 Document Reviewed: 07/02/2011 [facility] Patient Information 2014 [place], [place].

## 2013-06-10 NOTE — Progress Notes (Signed)
Subjective:    Patient ID: [name], female    DOB: February 11, 1981, 32 y.o.   MRN: [id number] Chief Complaint  Patient presents with  . Hand Injury    pt states she fell 2 days ago, has a small cut on the palm of her right hand    HPI  On Sat - 5d ago - pt fell forward onto her hands - was on the cement sidewalk.  However, over the past few days, the wound has gotten bigger and now has a whitish base to an ulcer. Is not draining anything but today the skin around it started looking more red and it has become more tender. Pt w/ type 1 DM and Addison's disease. Has been stressed out today and has not had anything to drink yet. No CP or palpitations. Doesn't remember when her last TDaP was but thinks it was here.  Past Medical History  Diagnosis Date  . Diabetes mellitus   . Addison disease   . Allergy   . Addison disease   . Thyroid disease    Current Outpatient Prescriptions on File Prior to Visit  Medication Sig Dispense Refill  . cetirizine (ZYRTEC) 10 MG tablet Take 10 mg by mouth daily.      . fludrocortisone (FLORINEF) 0.1 MG tablet Take 0.05 mg by mouth daily.      Marland Kitchen gabapentin (NEURONTIN) 100 MG capsule Take 100-300 mg by mouth at bedtime.       Marland Kitchen HUMALOG 100 UNIT/ML injection Inject 1-10 Units into the skin 3 (three) times daily before meals. Per sliding scale      . hydrocortisone (CORTEF) 10 MG tablet Take 1 tablet (10 mg total) by mouth 2 (two) times daily.  60 tablet  0  . insulin glargine (LANTUS) 100 UNIT/ML injection Inject 18 Units into the skin daily.       Marland Kitchen levothyroxine (SYNTHROID, LEVOTHROID) 75 MCG tablet Take 37.5 mcg by mouth daily before breakfast.      . norethindrone (ERRIN) 0.35 MG tablet Take 1 tablet (0.35 mg total) by mouth daily.  1 Package  11  . ramipril (ALTACE) 2.5 MG capsule Take 2.5 mg by mouth daily.      Marland Kitchen rOPINIRole (REQUIP) 0.25 MG tablet Take 0.25 mg by mouth at bedtime.       Marland Kitchen venlafaxine XR (EFFEXOR XR) 75 MG 24 hr capsule Take 1 capsule  daily  75 capsule  1   No current facility-administered medications on file prior to visit.   Allergies  Allergen Reactions  . Augmentin [Amoxicillin-Pot Clavulanate] Diarrhea    Review of Systems  Constitutional: Negative for fever, chills and diaphoresis.  Respiratory: Negative for cough, chest tightness, shortness of breath and wheezing.   Cardiovascular: Negative for chest pain, palpitations and leg swelling.  Musculoskeletal: Positive for myalgias. Negative for arthralgias and joint swelling.  Skin: Positive for color change, rash and wound. Negative for pallor.  Neurological: Negative for dizziness, tremors, weakness, light-headedness and numbness.  Hematological: Negative for adenopathy. Does not bruise/bleed easily.      BP 104/68  Pulse 124  Temp(Src) 98.1 F (36.7 C) (Oral)  Resp 20  Ht 5' 2.5" (1.588 m)  Wt 108 lb 12.8 oz (49.351 kg)  BMI 19.57 kg/m2  SpO2 97%  LMP 05/10/2013  Objective:   Physical Exam  Constitutional: She is oriented to person, place, and time. She appears well-developed and well-nourished. No distress.  HENT:  Head: Normocephalic and atraumatic.  Right Ear: External ear normal.  Eyes: Conjunctivae are normal. No scleral icterus.  Cardiovascular: Regular rhythm, normal heart sounds and intact distal pulses.   No extrasystoles are present. Tachycardia present.  Exam reveals no gallop and no friction rub.   No murmur heard. Pulmonary/Chest: Effort normal.  Neurological: She is alert and oriented to person, place, and time.  Skin: Skin is warm and dry. Laceration noted. She is not diaphoretic. There is erythema.  Left proximal center of palm a shallow ulceration approx 1 x 0.5 cm with white base and pink granulation tissue surrounding, dry white border w/ blanching erythema and tenderness surrounding. No fluctuance or induration. No warmth.  Psychiatric: She has a normal mood and affect. Her behavior is normal.      Assessment & Plan:  Diabetes  mellitus  Open wound of hand with complication, right, initial encounter - immunosuppressed secondary to DM so will cover w/ bid keflex.  Wound care with hand washing with soap and water to remove necrotic tissue tid followed by top bactroban. RTC immed for any increased pain, redness, swelling, or purulent drainage.  Sinus tachycardia - likely do to stress and dehydration. Taught how to check pulse and home and normal. Advised pushing fluids, rest. If continues or develops sxs, RTC for further eval  Need for prophylactic vaccination with combined diphtheria-tetanus-pertussis (DTP) vaccine - Plan: Tdap vaccine greater than or equal to 7yo IM - no TDaP on file so will do today  Meds ordered this encounter  Medications  . cephALEXin (KEFLEX) 500 MG capsule    Sig: Take 1 capsule (500 mg total) by mouth 2 (two) times daily.    Dispense:  14 capsule    Refill:  0  . mupirocin ointment (BACTROBAN) 2 %    Sig: Apply 1 application topically 3 (three) times daily.    Dispense:  22 g    Refill:  1  . fluconazole (DIFLUCAN) 150 MG tablet    Sig: Take 1 tablet (150 mg total) by mouth once.    Dispense:  1 tablet    Refill:  0    Norberto Sorenson, MD MPH

## 2013-07-03 ENCOUNTER — Other Ambulatory Visit: Payer: Self-pay | Admitting: Physician Assistant

## 2013-07-31 ENCOUNTER — Other Ambulatory Visit: Payer: Self-pay | Admitting: Physician Assistant

## 2013-08-02 NOTE — Telephone Encounter (Signed)
Dr Clelia Croft, we had sent pt notice on last RF that she needed OV and it looks like she came to see you for Check-up but didn't discuss this med. Last PAP on file is 06/15/12. Do you want to give pt RFs or RTC?

## 2013-08-03 NOTE — Telephone Encounter (Signed)
Yes, will refill.  Needs pap next yr.

## 2013-08-04 ENCOUNTER — Other Ambulatory Visit: Payer: Self-pay | Admitting: Physician Assistant

## 2013-08-11 ENCOUNTER — Ambulatory Visit (HOSPITAL_COMMUNITY): Payer: Self-pay | Admitting: Psychiatry

## 2013-08-13 ENCOUNTER — Other Ambulatory Visit (HOSPITAL_COMMUNITY): Payer: Self-pay | Admitting: Psychiatry

## 2013-08-16 ENCOUNTER — Ambulatory Visit (INDEPENDENT_AMBULATORY_CARE_PROVIDER_SITE_OTHER): Payer: BC Managed Care – PPO | Admitting: Psychiatry

## 2013-08-16 DIAGNOSIS — F329 Major depressive disorder, single episode, unspecified: Secondary | ICD-10-CM

## 2013-08-16 DIAGNOSIS — F32A Depression, unspecified: Secondary | ICD-10-CM

## 2013-08-16 DIAGNOSIS — F3289 Other specified depressive episodes: Secondary | ICD-10-CM

## 2013-08-17 ENCOUNTER — Encounter (HOSPITAL_COMMUNITY): Payer: Self-pay | Admitting: Psychiatry

## 2013-08-17 NOTE — Progress Notes (Signed)
   THERAPIST PROGRESS NOTE  Session Time: 2:30-3:30  Participation Level: Active  Behavioral Response: CasualAlertDysphoric  Type of Therapy: Individual Therapy  Treatment Goals addressed: emotion regulation, unemployment stress, self esteem, sleep disturbance  Interventions: CBT  Summary: [name] is a 33 y.o. female who presents with depression.   Suicidal/Homicidal: Nowithout intent/plan  Therapist Response: Pt. Reports that daily routine is irregular due to sleep disturbance. Discussed patient goals of addressing sleep disturbance, gaining employment, finding romantic relationship. Session focused on pattern of being guarded in relationships and fear of vulnerability.  Plan: Recommended that Pt. Follow up with Dr. Adele Schilder and sleep clinic regarding sleep disturbance. Continue with CBT and person-centered based therapy. Return again in 2 weeks.  Diagnosis: Axis I: Depressive Disorder NOS    Axis II: No diagnosis    Deanna, [name] 08/17/2013

## 2013-08-19 ENCOUNTER — Other Ambulatory Visit (HOSPITAL_COMMUNITY): Payer: Self-pay | Admitting: Psychiatry

## 2013-08-19 NOTE — Telephone Encounter (Signed)
30 days only. Needs to be seen for future refills.

## 2013-09-07 ENCOUNTER — Ambulatory Visit (HOSPITAL_COMMUNITY): Payer: Self-pay | Admitting: Psychiatry

## 2013-09-09 ENCOUNTER — Ambulatory Visit (INDEPENDENT_AMBULATORY_CARE_PROVIDER_SITE_OTHER): Payer: BC Managed Care – PPO | Admitting: Psychiatry

## 2013-09-09 ENCOUNTER — Encounter (HOSPITAL_COMMUNITY): Payer: Self-pay | Admitting: Psychiatry

## 2013-09-09 VITALS — BP 94/66 | HR 96 | Ht 62.0 in | Wt 111.0 lb

## 2013-09-09 DIAGNOSIS — F3289 Other specified depressive episodes: Secondary | ICD-10-CM

## 2013-09-09 DIAGNOSIS — F329 Major depressive disorder, single episode, unspecified: Secondary | ICD-10-CM

## 2013-09-09 MED ORDER — VENLAFAXINE HCL ER 37.5 MG PO CP24: ORAL_CAPSULE | ORAL | Status: DC

## 2013-09-09 NOTE — Progress Notes (Addendum)
[place] [phone] Progress Note  [name] [id number] 33 y.o.  09/09/2013 4:41 PM  Chief Complaint: I like Effexor.  History of Present Illness: [name] came for her appointment. She missed her last appointment.  She liked Effexor but she still has a lot of anxiety and nervousness.  She does not feel comfortable around people.  She has noticed very shy in public.  She is sleeping better.  She denies any irritability, anger, crying spells or any anhedonia.  She continues to have difficulty controlling her blood sugar.  However she recently seen her endocrinologist and she is working with him and had a plan to control her blood sugar.  She is also scheduled to see her sleep Dr. because she continues to have restlessness at night.  She stopped taking Requip because it was not helping her.  She had a good Christmas.  She visited her mother in [place].  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  Patient is still looking for full time job.  She denies any paranoia, hallucination, suicidal thoughts and homicidal thoughts.  She denies any tremors or shakes.  She is not drinking or using any illegal substances.  Her appetite and weight is unchanged from the past.  Suicidal Ideation: No Plan Formed: No Patient has means to carry out plan: No  Homicidal Ideation: No Plan Formed: No Patient has means to carry out plan: No  Review of Systems: Psychiatric: Agitation: No Hallucination: No Depressed Mood: No Insomnia: No Hypersomnia: No Altered Concentration: No Feels Worthless: Yes Grandiose Ideas: No Belief In Special Powers: No New/Increased Substance Abuse: No Compulsions: No  Neurologic: Headache: No Seizure: No Paresthesias: No  Medical History:  Patient has insulin dependent diabetes since age 57.  She is also diagnosed with Addison disease 7 years ago.  She had a sleep study and diagnosed with restless leg syndrome.  [facility]  urg[facility].  She sees Dr. Posey Pronto at [facility] for the management of diabetes.  Psychosocial history. Patient born in [place] and grew up here.  Patient denies any history of physical sexual verbal and emotional abuse.  Her brother and parents live in [place].  Patient lives by herself.  She is not in any relationship.  She has limited social network.  Outpatient Encounter Prescriptions as of 09/09/2013  Medication Sig  . cetirizine (ZYRTEC) 10 MG tablet Take 10 mg by mouth daily.  Marland Kitchen ERRIN 0.35 MG tablet TAKE 1 TABLET (0.35 MG TOTAL) BY MOUTH DAILY.  . fludrocortisone (FLORINEF) 0.1 MG tablet Take 0.05 mg by mouth daily.  Marland Kitchen gabapentin (NEURONTIN) 100 MG capsule Take 100-300 mg by mouth at bedtime.   Marland Kitchen HUMALOG 100 UNIT/ML injection Inject 1-10 Units into the skin 3 (three) times daily before meals. Per sliding scale  . hydrocortisone (CORTEF) 10 MG tablet Take 1 tablet (10 mg total) by mouth 2 (two) times daily.  . insulin glargine (LANTUS) 100 UNIT/ML injection Inject 18 Units into the skin daily.   Marland Kitchen levothyroxine (SYNTHROID, LEVOTHROID) 75 MCG tablet Take 37.5 mcg by mouth daily before breakfast.  . mupirocin ointment (BACTROBAN) 2 % Apply 1 application topically 3 (three) times daily.  . ramipril (ALTACE) 2.5 MG capsule Take 2.5 mg by mouth daily.  Marland Kitchen rOPINIRole (REQUIP) 0.25 MG tablet Take 0.25 mg by mouth at bedtime.   Marland Kitchen venlafaxine XR (EFFEXOR-XR) 37.5 MG 24 hr capsule TAKE THREE CAPSULE BY MOUTH DAILY  . [DISCONTINUED] venlafaxine XR (EFFEXOR-XR)  75 MG 24 hr capsule TAKE ONE CAPSULE BY MOUTH DAILY  . [DISCONTINUED] cephALEXin (KEFLEX) 500 MG capsule Take 1 capsule (500 mg total) by mouth 2 (two) times daily.  . [DISCONTINUED] fluconazole (DIFLUCAN) 150 MG tablet Take 1 tablet (150 mg total) by mouth once.    Past Psychiatric History/Hospitalization(s): Patient has tried Zoloft for 5 years given by primary care physician under the stop.  She has tried Wellbutrin however after  one week she started to have jitteriness and she stopped it.  Recently she tried Celexa however does not feel it is working.   Anxiety: Yes Bipolar Disorder: No Depression: Yes Mania: No Psychosis: No Schizophrenia: No Personality Disorder: No Hospitalization for psychiatric illness: No History of Electroconvulsive Shock Therapy: No Prior Suicide Attempts: No  Physical Exam: Constitutional:  BP 94/66  Pulse 96  Ht 5\' 2"  (1.575 m)  Wt 111 lb (50.349 kg)  BMI 20.30 kg/m2  General Appearance: alert, oriented, no acute distress and well nourished, well dressed well groomed.  Appears to be her stated age.  Musculoskeletal: Strength & Muscle Tone: within normal limits Gait & Station: normal Patient leans: N/A  Psychiatric: Speech (describe rate, volume, coherence, spontaneity, and abnormalities if any): Soft slow with decreased volume and tone.  Thought Process (describe rate, content, abstract reasoning, and computation): Logical goal-directed with normal rate and volume.  Associations: Relevant and Intact  Thoughts: normal, fund of knowledge adequate  Mental Status: Orientation: oriented to person, place, time/date and situation Mood & Affect: anxiety Attention Span & Concentration: Fair  Established Problem, Stable/Improving (1), Review of Psycho-Social Stressors (1), Established Problem, Worsening (2), Review of Last Therapy Session (1), Review of Medication Regimen & Side Effects (2) and Review of New Medication or Change in Dosage (2)  Assessment: Axis I: Depressive disorder NOS  Axis II: Deferred  Axis III: See medical history  Axis IV: Moderate  Axis V: 60   Plan:  I will increase Effexor to 112.5 daily.  Discuss to have sleep study again since she continues to have restlessness and periodic limb movement in sleep.  We will get records from her endocrinologist Dr. Posey Pronto from [facility] .  Recommend to keep appointment with therapist .  Discussed risks and  benefits of medication.  Followup in 6 weeks .  We will consider increasing the dose on her next appointment.  Time spent 25 minutes.  More than 50% of the time spent in psychoeducation, counseling and coordination of care.  Discuss safety plan that anytime having active suicidal thoughts or homicidal thoughts then patient need to call 911 or go to the local emergency room  ARFEEN,SYED T., MD 09/09/2013

## 2013-09-10 ENCOUNTER — Telehealth (HOSPITAL_COMMUNITY): Payer: Self-pay

## 2013-11-14 ENCOUNTER — Other Ambulatory Visit (HOSPITAL_COMMUNITY): Payer: Self-pay | Admitting: Psychiatry

## 2013-11-16 ENCOUNTER — Ambulatory Visit (INDEPENDENT_AMBULATORY_CARE_PROVIDER_SITE_OTHER): Payer: BC Managed Care – PPO | Admitting: Psychiatry

## 2013-11-16 ENCOUNTER — Encounter (HOSPITAL_COMMUNITY): Payer: Self-pay | Admitting: Psychiatry

## 2013-11-16 VITALS — BP 112/77 | HR 101 | Ht 62.0 in | Wt 110.6 lb

## 2013-11-16 DIAGNOSIS — F3289 Other specified depressive episodes: Secondary | ICD-10-CM

## 2013-11-16 DIAGNOSIS — F329 Major depressive disorder, single episode, unspecified: Secondary | ICD-10-CM

## 2013-11-16 MED ORDER — VENLAFAXINE HCL ER 150 MG PO CP24: 150.0000 mg | ORAL_CAPSULE | Freq: Every day | ORAL | Status: DC

## 2013-11-16 NOTE — Progress Notes (Signed)
[facility] [phone] Progress Note  Rukia Mcgillivray [id number] 33 y.o.  11/16/2013 2:47 PM  Chief Complaint: Medication management and followup.    History of Present Illness: [name] came for her appointment.  She is complying that her Effexor.  We have increased the dose on her last visit.  She is feeling better but she continues to have anxiety and nervousness.  She has difficulty falling asleep and sometimes she has no energy and lack of motivation to do things.  She's not taking Requip because she does not feel it helped.  However she is appointment with Dr. Algie Coffer on Thursday and she was discussed her sleep issue with him.  The patient also had appointment with Dr. Posey Pronto her endocrinologist at [facility] on May 28.  Overall her blood sugar has been better but she continues to have some time lack of motivation to keep her blood sugar under control.  She admitted some time not compliant with her diet .  Patient denies any side effects of Effexor.  She denies any rash, tremors or shakes.  She likes her part-time job .  She is working with [employer] business however she still looking for a full-time work.  The patient has a paranoia, hallucination or any active or passive suicidal thoughts and homicidal thoughts.  Her appetite and it is unchanged from the past.  Suicidal Ideation: No Plan Formed: No Patient has means to carry out plan: No  Homicidal Ideation: No Plan Formed: No Patient has means to carry out plan: No  Review of Systems: Psychiatric: Agitation: No Hallucination: No Depressed Mood: No Insomnia: No Hypersomnia: No Altered Concentration: No Feels Worthless: Yes Grandiose Ideas: No Belief In Special Powers: No New/Increased Substance Abuse: No Compulsions: No  Neurologic: Headache: No Seizure: No Paresthesias: No  Medical History:  Patient has insulin dependent diabetes since age 80.  She is also diagnosed with Addison disease 7 years ago.  She had a  sleep study and diagnosed with restless leg syndrome.  She goes to [facility] urgent care.  She sees Dr. Posey Pronto at [facility] for the management of diabetes.  Psychosocial history. Patient born in [place] and grew up here.  Patient denies any history of physical sexual verbal and emotional abuse.  Her brother and parents live in [place].  Patient lives by herself.  She is not in any relationship.  She has limited social network.  Outpatient Encounter Prescriptions as of 11/16/2013  Medication Sig  . cetirizine (ZYRTEC) 10 MG tablet Take 10 mg by mouth daily.  Marland Kitchen ERRIN 0.35 MG tablet TAKE 1 TABLET (0.35 MG TOTAL) BY MOUTH DAILY.  . fludrocortisone (FLORINEF) 0.1 MG tablet Take 0.05 mg by mouth daily.  Marland Kitchen gabapentin (NEURONTIN) 100 MG capsule Take 100-300 mg by mouth at bedtime.   Marland Kitchen HUMALOG 100 UNIT/ML injection Inject 1-10 Units into the skin 3 (three) times daily before meals. Per sliding scale  . hydrocortisone (CORTEF) 10 MG tablet Take 1 tablet (10 mg total) by mouth 2 (two) times daily.  . insulin glargine (LANTUS) 100 UNIT/ML injection Inject 18 Units into the skin daily.   Marland Kitchen levothyroxine (SYNTHROID, LEVOTHROID) 75 MCG tablet Take 37.5 mcg by mouth daily before breakfast.  . mupirocin ointment (BACTROBAN) 2 % Apply 1 application topically 3 (three) times daily.  . ramipril (ALTACE) 2.5 MG capsule Take 2.5 mg by mouth daily.  Marland Kitchen rOPINIRole (REQUIP) 0.25 MG tablet Take 0.25 mg by mouth at bedtime.   Marland Kitchen venlafaxine XR (EFFEXOR-XR) 150  MG 24 hr capsule Take 1 capsule (150 mg total) by mouth daily with breakfast.  . [DISCONTINUED] venlafaxine XR (EFFEXOR-XR) 37.5 MG 24 hr capsule TAKE THREE CAPSULE BY MOUTH DAILY    Past Psychiatric History/Hospitalization(s): Patient has tried Zoloft for 5 years given by primary care physician under the stop.  She has tried Wellbutrin however after one week she started to have jitteriness and she stopped it.  Recently she tried Celexa however does not feel  it is working.   Anxiety: Yes Bipolar Disorder: No Depression: Yes Mania: No Psychosis: No Schizophrenia: No Personality Disorder: No Hospitalization for psychiatric illness: No History of Electroconvulsive Shock Therapy: No Prior Suicide Attempts: No  Physical Exam: Constitutional:  BP 112/77  Pulse 101  Ht 5\' 2"  (1.575 m)  Wt 110 lb 9.6 oz (50.168 kg)  BMI 20.22 kg/m2  General Appearance: alert, oriented, no acute distress and well nourished, well dressed well groomed.  She appears to be in her stated age.  Musculoskeletal: Strength & Muscle Tone: within normal limits Gait & Station: normal Patient leans: N/A  Psychiatric: Speech (describe rate, volume, coherence, spontaneity, and abnormalities if any): Soft slow with decreased volume and tone.  Thought Process (describe rate, content, abstract reasoning, and computation): Logical goal-directed with normal rate and volume.  Associations: Relevant and Intact  Thoughts: normal, fund of knowledge adequate  Mental Status: Orientation: oriented to person, place, time/date and situation Mood & Affect: anxiety Attention Span & Concentration: Fair  Established Problem, Stable/Improving (1), Review of Psycho-Social Stressors (1), Review of Last Therapy Session (1), Review of Medication Regimen & Side Effects (2) and Review of New Medication or Change in Dosage (2)  Assessment: Axis I: Depressive disorder NOS  Axis II: Deferred  Axis III: See medical history  Axis IV: Moderate  Axis V: 60   Plan:  I recommended to try Effexor 150 mg daily.  Discuss to keep appointment with her sleep doctor and endocrinologist.  I also recommended to try taking Effexor in the morning to help her energy.  Recommended to call us back if she has any questions or concerns.  I will see her again in 2 months.  ARFEEN,SYED T., MD 11/16/2013

## 2013-12-26 ENCOUNTER — Emergency Department (HOSPITAL_COMMUNITY): Payer: BC Managed Care – PPO

## 2013-12-26 ENCOUNTER — Inpatient Hospital Stay (HOSPITAL_COMMUNITY)
Admission: EM | Admit: 2013-12-26 | Discharge: 2013-12-29 | DRG: 638 | Disposition: A | Discharge status: Home / Self Care | Payer: BC Managed Care – PPO | Attending: Internal Medicine | Admitting: Internal Medicine

## 2013-12-26 ENCOUNTER — Encounter (HOSPITAL_COMMUNITY): Payer: Self-pay | Admitting: Emergency Medicine

## 2013-12-26 DIAGNOSIS — Z833 Family history of diabetes mellitus: Secondary | ICD-10-CM

## 2013-12-26 DIAGNOSIS — E1149 Type 2 diabetes mellitus with other diabetic neurological complication: Secondary | ICD-10-CM

## 2013-12-26 DIAGNOSIS — F418 Other specified anxiety disorders: Secondary | ICD-10-CM

## 2013-12-26 DIAGNOSIS — E1010 Type 1 diabetes mellitus with ketoacidosis without coma: Principal | ICD-10-CM | POA: Diagnosis present

## 2013-12-26 DIAGNOSIS — E1065 Type 1 diabetes mellitus with hyperglycemia: Secondary | ICD-10-CM | POA: Diagnosis present

## 2013-12-26 DIAGNOSIS — E876 Hypokalemia: Secondary | ICD-10-CM | POA: Diagnosis present

## 2013-12-26 DIAGNOSIS — E271 Primary adrenocortical insufficiency: Secondary | ICD-10-CM

## 2013-12-26 DIAGNOSIS — Z79899 Other long term (current) drug therapy: Secondary | ICD-10-CM

## 2013-12-26 DIAGNOSIS — E2749 Other adrenocortical insufficiency: Secondary | ICD-10-CM | POA: Diagnosis present

## 2013-12-26 DIAGNOSIS — F3289 Other specified depressive episodes: Secondary | ICD-10-CM | POA: Diagnosis present

## 2013-12-26 DIAGNOSIS — E1142 Type 2 diabetes mellitus with diabetic polyneuropathy: Secondary | ICD-10-CM

## 2013-12-26 DIAGNOSIS — G4710 Hypersomnia, unspecified: Secondary | ICD-10-CM | POA: Diagnosis present

## 2013-12-26 DIAGNOSIS — J029 Acute pharyngitis, unspecified: Secondary | ICD-10-CM | POA: Diagnosis present

## 2013-12-26 DIAGNOSIS — F32A Depression, unspecified: Secondary | ICD-10-CM

## 2013-12-26 DIAGNOSIS — E871 Hypo-osmolality and hyponatremia: Secondary | ICD-10-CM | POA: Diagnosis present

## 2013-12-26 DIAGNOSIS — E1049 Type 1 diabetes mellitus with other diabetic neurological complication: Secondary | ICD-10-CM | POA: Diagnosis present

## 2013-12-26 DIAGNOSIS — F329 Major depressive disorder, single episode, unspecified: Secondary | ICD-10-CM | POA: Diagnosis present

## 2013-12-26 DIAGNOSIS — E079 Disorder of thyroid, unspecified: Secondary | ICD-10-CM

## 2013-12-26 DIAGNOSIS — R4182 Altered mental status, unspecified: Secondary | ICD-10-CM | POA: Diagnosis present

## 2013-12-26 DIAGNOSIS — E1140 Type 2 diabetes mellitus with diabetic neuropathy, unspecified: Secondary | ICD-10-CM

## 2013-12-26 DIAGNOSIS — E875 Hyperkalemia: Secondary | ICD-10-CM | POA: Diagnosis present

## 2013-12-26 DIAGNOSIS — G2581 Restless legs syndrome: Secondary | ICD-10-CM | POA: Diagnosis present

## 2013-12-26 DIAGNOSIS — E1110 Type 2 diabetes mellitus with ketoacidosis without coma: Secondary | ICD-10-CM

## 2013-12-26 DIAGNOSIS — F411 Generalized anxiety disorder: Secondary | ICD-10-CM | POA: Diagnosis present

## 2013-12-26 DIAGNOSIS — Z794 Long term (current) use of insulin: Secondary | ICD-10-CM

## 2013-12-26 DIAGNOSIS — G4761 Periodic limb movement disorder: Secondary | ICD-10-CM | POA: Diagnosis present

## 2013-12-26 HISTORY — DX: Type 2 diabetes mellitus with diabetic neuropathy, unspecified: E11.40

## 2013-12-26 HISTORY — DX: Restless legs syndrome: G25.81

## 2013-12-26 LAB — BASIC METABOLIC PANEL WITH GFR
BUN: 18 mg/dL (ref 6–23)
BUN: 15 mg/dL (ref 6–23)
BUN: 13 mg/dL (ref 6–23)
CO2: 12 meq/L — ABNORMAL LOW (ref 19–32)
CO2: 8 meq/L — CL (ref 19–32)
CO2: 10 meq/L — CL (ref 19–32)
Calcium: 7.6 mg/dL — ABNORMAL LOW (ref 8.4–10.5)
Calcium: 7.7 mg/dL — ABNORMAL LOW (ref 8.4–10.5)
Calcium: 7.6 mg/dL — ABNORMAL LOW (ref 8.4–10.5)
Chloride: 97 meq/L (ref 96–112)
Chloride: 99 meq/L (ref 96–112)
Chloride: 98 meq/L (ref 96–112)
Creatinine, Ser: 0.69 mg/dL (ref 0.50–1.10)
Creatinine, Ser: 0.66 mg/dL (ref 0.50–1.10)
Creatinine, Ser: 0.6 mg/dL (ref 0.50–1.10)
GFR calc Af Amer: >90 mL/min
GFR calc Af Amer: >90 mL/min
GFR calc Af Amer: >90 mL/min
GFR calc non Af Amer: >90 mL/min
GFR calc non Af Amer: >90 mL/min
GFR calc non Af Amer: >90 mL/min
Glucose, Bld: 187 mg/dL — ABNORMAL HIGH (ref 70–99)
Glucose, Bld: 197 mg/dL — ABNORMAL HIGH (ref 70–99)
Glucose, Bld: 201 mg/dL — ABNORMAL HIGH (ref 70–99)
Potassium: 5 meq/L (ref 3.7–5.3)
Potassium: 5.3 meq/L (ref 3.7–5.3)
Potassium: 5.4 meq/L — ABNORMAL HIGH (ref 3.7–5.3)
Sodium: 132 meq/L — ABNORMAL LOW (ref 137–147)
Sodium: 133 meq/L — ABNORMAL LOW (ref 137–147)
Sodium: 133 meq/L — ABNORMAL LOW (ref 137–147)

## 2013-12-26 LAB — CBC WITH DIFFERENTIAL/PLATELET
Basophils Absolute: 0 10*3/uL (ref 0.0–0.1)
Basophils Relative: 0 % (ref 0–1)
Eosinophils Absolute: 0 10*3/uL (ref 0.0–0.7)
Eosinophils Relative: 0 % (ref 0–5)
HCT: 49.4 % — ABNORMAL HIGH (ref 36.0–46.0)
Hemoglobin: 18 g/dL — ABNORMAL HIGH (ref 12.0–15.0)
Lymphocytes Relative: 19 % (ref 12–46)
Lymphs Abs: 3.3 10*3/uL (ref 0.7–4.0)
MCH: 32.3 pg (ref 26.0–34.0)
MCHC: 36.4 g/dL — ABNORMAL HIGH (ref 30.0–36.0)
MCV: 88.5 fL (ref 78.0–100.0)
MONO ABS: 1.7 10*3/uL — AB (ref 0.1–1.0)
Monocytes Relative: 10 % (ref 3–12)
Neutro Abs: 12.1 10*3/uL — ABNORMAL HIGH (ref 1.7–7.7)
Neutrophils Relative %: 71 % (ref 43–77)
PLATELETS: 286 10*3/uL (ref 150–400)
RBC: 5.58 MIL/uL — AB (ref 3.87–5.11)
RDW: 12.1 % (ref 11.5–15.5)
WBC: 17.1 10*3/uL — AB (ref 4.0–10.5)

## 2013-12-26 LAB — I-STAT CHEM 8, ED
BUN: 25 mg/dL — AB (ref 6–23)
CHLORIDE: 109 meq/L (ref 96–112)
CREATININE: 0.7 mg/dL (ref 0.50–1.10)
Calcium, Ion: 1.09 mmol/L — ABNORMAL LOW (ref 1.12–1.23)
Glucose, Bld: 372 mg/dL — ABNORMAL HIGH (ref 70–99)
HCT: 50 % — ABNORMAL HIGH (ref 36.0–46.0)
Hemoglobin: 17 g/dL — ABNORMAL HIGH (ref 12.0–15.0)
POTASSIUM: 5.5 meq/L — AB (ref 3.7–5.3)
SODIUM: 131 meq/L — AB (ref 137–147)
TCO2: 8 mmol/L (ref 0–100)

## 2013-12-26 LAB — COMPREHENSIVE METABOLIC PANEL
ALK PHOS: 137 U/L — AB (ref 39–117)
ALT: 23 U/L (ref 0–35)
AST: 30 U/L (ref 0–37)
Albumin: 4.1 g/dL (ref 3.5–5.2)
BUN: 29 mg/dL — ABNORMAL HIGH (ref 6–23)
CALCIUM: 9.7 mg/dL (ref 8.4–10.5)
Chloride: 85 mEq/L — ABNORMAL LOW (ref 96–112)
Creatinine, Ser: 0.86 mg/dL (ref 0.50–1.10)
GFR calc Af Amer: >90 mL/min (ref >90)
GFR, EST NON AFRICAN AMERICAN: 87 mL/min — AB (ref >90)
Glucose, Bld: 473 mg/dL — ABNORMAL HIGH (ref 70–99)
POTASSIUM: 6.3 meq/L — AB (ref 3.7–5.3)
SODIUM: 123 meq/L — AB (ref 137–147)
TOTAL PROTEIN: 7.8 g/dL (ref 6.0–8.3)
Total Bilirubin: <0.2 mg/dL — ABNORMAL LOW (ref 0.3–1.2)

## 2013-12-26 LAB — URINALYSIS, ROUTINE W REFLEX MICROSCOPIC
Bilirubin Urine: NEGATIVE
Ketones, ur: >80 mg/dL — AB
Leukocytes, UA: NEGATIVE
Nitrite: NEGATIVE
Protein, ur: 30 mg/dL — AB
SPECIFIC GRAVITY, URINE: 1.02 (ref 1.005–1.030)
Urobilinogen, UA: 0.2 mg/dL (ref 0.0–1.0)
pH: 5 (ref 5.0–8.0)

## 2013-12-26 LAB — BASIC METABOLIC PANEL
BUN: 20 mg/dL (ref 6–23)
CO2: 7 meq/L — AB (ref 19–32)
Calcium: 7.1 mg/dL — ABNORMAL LOW (ref 8.4–10.5)
Chloride: 100 mEq/L (ref 96–112)
Creatinine, Ser: 0.69 mg/dL (ref 0.50–1.10)
GFR calc Af Amer: >90 mL/min (ref >90)
GFR calc non Af Amer: >90 mL/min (ref >90)
GLUCOSE: 183 mg/dL — AB (ref 70–99)
POTASSIUM: 4.7 meq/L (ref 3.7–5.3)
Sodium: 129 mEq/L — ABNORMAL LOW (ref 137–147)

## 2013-12-26 LAB — GLUCOSE, CAPILLARY
GLUCOSE-CAPILLARY: 177 mg/dL — AB (ref 70–99)
GLUCOSE-CAPILLARY: 184 mg/dL — AB (ref 70–99)
Glucose-Capillary: 214 mg/dL — ABNORMAL HIGH (ref 70–99)
Glucose-Capillary: 171 mg/dL — ABNORMAL HIGH (ref 70–99)
Glucose-Capillary: 165 mg/dL — ABNORMAL HIGH (ref 70–99)
Glucose-Capillary: 167 mg/dL — ABNORMAL HIGH (ref 70–99)
Glucose-Capillary: 173 mg/dL — ABNORMAL HIGH (ref 70–99)
Glucose-Capillary: 196 mg/dL — ABNORMAL HIGH (ref 70–99)
Glucose-Capillary: 178 mg/dL — ABNORMAL HIGH (ref 70–99)

## 2013-12-26 LAB — BLOOD GAS, VENOUS
Acid-base deficit: 27.4 mmol/L — ABNORMAL HIGH (ref 0.0–2.0)
BICARBONATE: 5.6 meq/L — AB (ref 20.0–24.0)
O2 Saturation: 70.3 %
PCO2 VEN: 23.1 mmHg — AB (ref 45.0–50.0)
Patient temperature: 98.6
TCO2: 5.3 mmol/L (ref 0–100)
pH, Ven: 7.012 — CL (ref 7.250–7.300)
pO2, Ven: 43.4 mmHg (ref 30.0–45.0)

## 2013-12-26 LAB — RAPID URINE DRUG SCREEN, HOSP PERFORMED
Amphetamines: NOT DETECTED
BENZODIAZEPINES: NOT DETECTED
Barbiturates: NOT DETECTED
Cocaine: NOT DETECTED
OPIATES: NOT DETECTED
Tetrahydrocannabinol: NOT DETECTED

## 2013-12-26 LAB — CBG MONITORING, ED
GLUCOSE-CAPILLARY: 397 mg/dL — AB (ref 70–99)
Glucose-Capillary: 452 mg/dL — ABNORMAL HIGH (ref 70–99)
Glucose-Capillary: 318 mg/dL — ABNORMAL HIGH (ref 70–99)

## 2013-12-26 LAB — URINE MICROSCOPIC-ADD ON

## 2013-12-26 LAB — LIPASE, BLOOD: Lipase: 12 U/L (ref 11–59)

## 2013-12-26 LAB — MRSA PCR SCREENING: MRSA by PCR: NEGATIVE

## 2013-12-26 MED ORDER — SODIUM CHLORIDE 0.9 % IV SOLN
1000.0000 mL | Freq: Once | INTRAVENOUS | Status: AC
  Administered 2013-12-26: 1000 mL via INTRAVENOUS

## 2013-12-26 MED ORDER — SODIUM CHLORIDE 0.9 % IV SOLN
INTRAVENOUS | Status: DC
  Administered 2013-12-27: 13:00:00 via INTRAVENOUS

## 2013-12-26 MED ORDER — ROPINIROLE HCL 0.25 MG PO TABS
0.2500 mg | ORAL_TABLET | Freq: Every day | ORAL | Status: DC
  Administered 2013-12-26 – 2013-12-28 (×3): 0.25 mg via ORAL
  Filled 2013-12-26 (×4): qty 1

## 2013-12-26 MED ORDER — ONDANSETRON HCL 4 MG/2ML IJ SOLN
4.0000 mg | Freq: Once | INTRAMUSCULAR | Status: AC
  Administered 2013-12-26: 4 mg via INTRAVENOUS
  Filled 2013-12-26: qty 2

## 2013-12-26 MED ORDER — ENOXAPARIN SODIUM 40 MG/0.4ML ~~LOC~~ SOLN
40.0000 mg | SUBCUTANEOUS | Status: DC
  Administered 2013-12-26 – 2013-12-27 (×2): 40 mg via SUBCUTANEOUS
  Filled 2013-12-26 (×4): qty 0.4

## 2013-12-26 MED ORDER — GABAPENTIN 100 MG PO CAPS
100.0000 mg | ORAL_CAPSULE | Freq: Every day | ORAL | Status: DC
  Administered 2013-12-26 – 2013-12-28 (×3): 300 mg via ORAL
  Filled 2013-12-26 (×4): qty 3

## 2013-12-26 MED ORDER — DEXTROSE 50 % IV SOLN: 25.0000 mL | INTRAVENOUS | Status: DC | PRN

## 2013-12-26 MED ORDER — HYDROCORTISONE NA SUCCINATE PF 100 MG IJ SOLR
50.0000 mg | Freq: Once | INTRAMUSCULAR | Status: AC
  Administered 2013-12-26: 50 mg via INTRAVENOUS
  Filled 2013-12-26: qty 2

## 2013-12-26 MED ORDER — DEXTROSE-NACL 5-0.45 % IV SOLN
INTRAVENOUS | Status: DC
  Administered 2013-12-26 – 2013-12-27 (×2): via INTRAVENOUS

## 2013-12-26 MED ORDER — SODIUM BICARBONATE 8.4 % IV SOLN
INTRAVENOUS | Status: AC
  Filled 2013-12-26: qty 50

## 2013-12-26 MED ORDER — SODIUM BICARBONATE 8.4 % IV SOLN
50.0000 meq | Freq: Once | INTRAVENOUS | Status: AC
  Administered 2013-12-26: 50 meq via INTRAVENOUS
  Filled 2013-12-26: qty 50

## 2013-12-26 MED ORDER — SODIUM CHLORIDE 0.9 % IV SOLN
INTRAVENOUS | Status: AC
  Administered 2013-12-26: 16:00:00 via INTRAVENOUS

## 2013-12-26 MED ORDER — SODIUM BICARBONATE 4.2 % IV SOLN
50.0000 meq | Freq: Once | INTRAVENOUS | Status: DC
  Filled 2013-12-26: qty 100

## 2013-12-26 MED ORDER — SODIUM BICARBONATE 8.4 % IV SOLN
100.0000 meq | Freq: Once | INTRAVENOUS | Status: AC
  Administered 2013-12-26: 100 meq via INTRAVENOUS

## 2013-12-26 MED ORDER — ONDANSETRON HCL 4 MG/2ML IJ SOLN
4.0000 mg | Freq: Four times a day (QID) | INTRAMUSCULAR | Status: DC
  Administered 2013-12-26 – 2013-12-29 (×8): 4 mg via INTRAVENOUS
  Filled 2013-12-26 (×8): qty 2

## 2013-12-26 MED ORDER — VENLAFAXINE HCL ER 150 MG PO CP24
150.0000 mg | ORAL_CAPSULE | Freq: Every day | ORAL | Status: DC
  Administered 2013-12-27 – 2013-12-29 (×3): 150 mg via ORAL
  Filled 2013-12-26 (×4): qty 1

## 2013-12-26 MED ORDER — HYDROCORTISONE SOD SUCCINATE 100 MG PF FOR IT USE
50.0000 mg | INTRAMUSCULAR | Status: DC
  Filled 2013-12-26: qty 0.5

## 2013-12-26 MED ORDER — SODIUM CHLORIDE 0.9 % IV SOLN
INTRAVENOUS | Status: DC
  Administered 2013-12-26: 3.4 [IU]/h via INTRAVENOUS
  Filled 2013-12-26: qty 1

## 2013-12-26 MED ORDER — PANTOPRAZOLE SODIUM 40 MG IV SOLR
40.0000 mg | Freq: Every day | INTRAVENOUS | Status: DC
  Administered 2013-12-26 – 2013-12-28 (×3): 40 mg via INTRAVENOUS
  Filled 2013-12-26 (×3): qty 40

## 2013-12-26 MED ORDER — FLUDROCORTISONE ACETATE 0.1 MG PO TABS
0.0500 mg | ORAL_TABLET | Freq: Every day | ORAL | Status: DC
  Administered 2013-12-27 – 2013-12-29 (×3): 0.05 mg via ORAL
  Filled 2013-12-26 (×4): qty 0.5

## 2013-12-26 MED ORDER — HYDROCORTISONE NA SUCCINATE PF 100 MG IJ SOLR
50.0000 mg | Freq: Three times a day (TID) | INTRAMUSCULAR | Status: DC
  Administered 2013-12-26 – 2013-12-28 (×5): 50 mg via INTRAVENOUS
  Filled 2013-12-26: qty 1
  Filled 2013-12-26: qty 2
  Filled 2013-12-26 (×5): qty 1
  Filled 2013-12-26: qty 2

## 2013-12-26 MED ORDER — SODIUM BICARBONATE 8.4 % IV SOLN
100.0000 meq | Freq: Once | INTRAVENOUS | Status: AC
  Administered 2013-12-26: 100 meq via INTRAVENOUS
  Filled 2013-12-26: qty 100

## 2013-12-26 MED ORDER — SODIUM CHLORIDE 0.9 % IV SOLN
INTRAVENOUS | Status: DC
  Administered 2013-12-27: 0.7 [IU]/h via INTRAVENOUS
  Administered 2013-12-27: 3.2 [IU]/h via INTRAVENOUS
  Administered 2013-12-27: 16:00:00 via INTRAVENOUS
  Filled 2013-12-26: qty 1

## 2013-12-26 MED ORDER — NORETHINDRONE 0.35 MG PO TABS: 1.0000 | ORAL_TABLET | Freq: Every day | ORAL | Status: DC

## 2013-12-26 MED ORDER — ACETAMINOPHEN 650 MG RE SUPP
975.0000 mg | Freq: Once | RECTAL | Status: AC
  Administered 2013-12-26: 975 mg via RECTAL
  Filled 2013-12-26: qty 1

## 2013-12-26 MED ORDER — DEXTROSE-NACL 5-0.45 % IV SOLN: INTRAVENOUS | Status: DC

## 2013-12-26 MED ORDER — LEVOTHYROXINE SODIUM 75 MCG PO TABS
37.5000 ug | ORAL_TABLET | Freq: Every day | ORAL | Status: DC
  Administered 2013-12-27 – 2013-12-29 (×3): 37.5 ug via ORAL
  Filled 2013-12-26 (×4): qty 0.5

## 2013-12-26 MED ORDER — LORATADINE 10 MG PO TABS
10.0000 mg | ORAL_TABLET | Freq: Every day | ORAL | Status: DC
  Administered 2013-12-27 – 2013-12-29 (×3): 10 mg via ORAL
  Filled 2013-12-26 (×4): qty 1

## 2013-12-26 MED ORDER — SODIUM CHLORIDE 0.9 % IV BOLUS (SEPSIS)
1000.0000 mL | Freq: Once | INTRAVENOUS | Status: AC
  Administered 2013-12-26: 1000 mL via INTRAVENOUS

## 2013-12-26 MED ORDER — POTASSIUM CHLORIDE 10 MEQ/100ML IV SOLN
10.0000 meq | INTRAVENOUS | Status: AC
  Administered 2013-12-26 (×2): 10 meq via INTRAVENOUS
  Filled 2013-12-26 (×2): qty 100

## 2013-12-26 MED ORDER — SODIUM CHLORIDE 0.9 % IV SOLN: 1000.0000 mL | INTRAVENOUS | Status: DC

## 2013-12-26 MED ORDER — METOCLOPRAMIDE HCL 5 MG/ML IJ SOLN
10.0000 mg | Freq: Once | INTRAMUSCULAR | Status: AC
  Administered 2013-12-26: 10 mg via INTRAVENOUS
  Filled 2013-12-26: qty 2

## 2013-12-26 NOTE — H&P (Signed)
History and Physical:    Kathryn Cosby UEA:[id number] DOB: 1981-06-23 DOA: 12/26/2013  Referring physician: Dr. Canary Brim PCP: Cari Caraway, MD   Chief Complaint: Altered mental status, pain all over  History of Present Illness:   [name] is an 33 y.o. 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  The patient complains of a 24 hour history of nausea and pain "all over", as well as some shortness of breath.  She has significant restlessness and discomfort and is unable to answer any questions, repeatedly saying "I don't know "when asked about her preadmission symptoms. The patient's mother reports that she is typically compliant with her insulin, and was normal 3 days prior to admission. She became concerned when she talked to her on the phone last night and she didn't sound "normal ". This prompted her mother to come visit her early today. Patient mother lives out of town in [place].  ROS:   Unable to obtain secondary to altered mental status.   Past Medical History:   Past Medical History  Diagnosis Date  . 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 Addison disease   . Allergy   . Thyroid disease   . Hypersomnia     On Adderall  . Diabetic neuropathy   . RLS (restless legs syndrome)     Past Surgical History:   Past Surgical History  Procedure Laterality Date  . Wisdom tooth extraction      Social History:   History   Social History  . Marital Status: Single    Spouse Name: N/A    Number of Children: 0  . Years of Education: N/A   Occupational History  . Unemployed    Social History Main Topics  . Smoking status: Never Smoker   . Smokeless tobacco: Never Used  . Alcohol Use: 1.2 oz/week    2 Cans of beer per week   Comment: occasionally less than once a month, mixed drink  . Drug Use: No  . Sexual Activity: Yes    Birth Control/ Protection: Condom   Other Topics Concern  . Not on file   Social History Narrative   Lives alone.  Single.  No children.  Unemployed.      Family history:   Family History  Problem Relation Age of Onset  . Alzheimer's disease Maternal Grandmother   . Diabetes Paternal Grandmother   . Cancer Paternal Grandfather     Allergies   Augmentin  Current Medications:   Prior to Admission medications   Medication Sig Start Date End Date Taking? Authorizing Provider  amphetamine-dextroamphetamine (ADDERALL) 10 MG tablet Take 10 mg by mouth 2 (two) times daily.    Yes Historical Provider, MD  cetirizine (ZYRTEC) 10 MG tablet Take 10 mg by mouth daily.    Historical Provider, MD  ERRIN 0.35 MG tablet TAKE 1 TABLET (0.35 MG TOTAL) BY MOUTH DAILY. 07/31/13   Shawnee Knapp, MD  fludrocortisone (FLORINEF) 0.1 MG tablet Take 0.05 mg by mouth daily.    Historical Provider, MD  gabapentin (NEURONTIN) 100 MG capsule Take 100-300 mg by mouth at bedtime.     Historical Provider, MD  HUMALOG 100 UNIT/ML injection Inject 1-10 Units into the skin 3 (three) times daily before meals.  Per sliding scale 09/25/12   Historical Provider, MD  hydrocortisone (CORTEF) 10 MG tablet Take 1 tablet (10 mg total) by mouth 2 (two) times daily. 01/07/13   Gay Filler Copland, MD  insulin glargine (LANTUS) 100 UNIT/ML injection Inject 18 Units into the skin daily.     Historical Provider, MD  levothyroxine (SYNTHROID, LEVOTHROID) 75 MCG tablet Take 37.5 mcg by mouth daily before breakfast.    Historical Provider, MD  mupirocin ointment (BACTROBAN) 2 % Apply 1 application topically 3 (three) times daily. 06/10/13   Shawnee Knapp, MD  ramipril (ALTACE) 2.5 MG capsule Take 2.5 mg by mouth daily.    Historical Provider, MD  rOPINIRole (REQUIP) 0.25 MG tablet Take 0.25 mg by mouth at bedtime.     Historical Provider, MD    venlafaxine XR (EFFEXOR-XR) 150 MG 24 hr capsule Take 1 capsule (150 mg total) by mouth daily with breakfast. 11/16/13   Kathlee Nations, MD    Physical Exam:   Filed Vitals:   12/26/13 1104 12/26/13 1112 12/26/13 1153 12/26/13 1200  BP:  116/71 126/68 123/66  Pulse:  103 98 99  Temp:  97.8 F (36.6 C)    TempSrc:  Oral    Resp:  20 20 19   SpO2: 98% 97% 100% 99%     Physical Exam: Blood pressure 123/66, pulse 99, temperature 97.8 F (36.6 C), temperature source Oral, resp. rate 19, SpO2 99.00%. Gen: Restless. Head: Normocephalic, atraumatic. Eyes: PERRL, EOMI, sclerae nonicteric. Mouth: Oropharynx clear with very dry mucous membranes. Neck: Supple, no thyromegaly, no lymphadenopathy, no jugular venous distention. Chest: Lungs clear to auscultation bilaterally. CV: Heart sounds are tachycardic but regular. No murmurs, rubs, or gallops. Abdomen: Soft, nontender, nondistended with normal active bowel sounds. Extremities: Extremities are without clubbing, edema, or cyanosis. Skin: Warm and dry. Neuro: Mild restlessness/agitation; cranial nerves II through XII grossly intact. Psych: Mood and affect anxious.   Data Review:    Labs: Basic Metabolic Panel:  Recent Labs Lab 12/26/13 1145  NA 123*  K 6.3*  CL 85*  CO2 <7*  GLUCOSE 473*  BUN 29*  CREATININE 0.86  CALCIUM 9.7   Liver Function Tests:  Recent Labs Lab 12/26/13 1145  AST 30  ALT 23  ALKPHOS 137*  BILITOT <0.2*  PROT 7.8  ALBUMIN 4.1    Recent Labs Lab 12/26/13 1145  LIPASE 12   CBC:  Recent Labs Lab 12/26/13 1145  WBC 17.1*  NEUTROABS 12.1*  HGB 18.0*  HCT 49.4*  MCV 88.5  PLT 286   CBG:  Recent Labs Lab 12/26/13 1125 12/26/13 1251  GLUCAP 452* 397*    Radiographic Studies: Dg Chest 2 View  12/26/2013   CLINICAL DATA:  Chest pain and shortness of breath  EXAM: CHEST  2 VIEW  COMPARISON:  Prior chest x-ray 10/18/2012  FINDINGS: The lungs are clear and negative for focal  airspace consolidation, pulmonary edema or suspicious pulmonary nodule. No pleural effusion or pneumothorax. Cardiac and mediastinal contours are within normal limits. No acute fracture or lytic or blastic osseous lesions. The visualized upper abdominal bowel gas pattern is unremarkable.  IMPRESSION: No active cardiopulmonary disease.   Electronically Signed   By: Jacqulynn Cadet M.D.   On: 12/26/2013 12:25    EKG: Independently reviewed. QT interval 505 ms, sinus tachycardia at 101 beats per minute. Early repolarization abnormality noted.   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  Start on insulin drip per glucose stabilizer protocol with every hour CBG checks and every 2 hour BMET checks until stable.  Supplement potassium if needed. Currently no supplementation required.  Add dextrose to IV fluids when serum glucose less than 250.  Transition to basal/bolus insulin when the ketoacidosis has resolved and the patient is able to eat. Continue IV insulin infusion for one to two hours after initiating the SQ insulin, to avoid recurrent hyperglycemia.  Given one amp of sodium bicarbonate for severe acidosis.  Unclear trigger. No signs of pneumonia on chest radiography. Awaiting urinalysis. Check urine drug screen.  Active Problems: Addison disease  Start stress dose hydrocortisone, continue fludrocortisone.  Restless leg  Continue Mirapex.  Thyroid disease  Continue Synthroid.  Diabetic neuropathy  Continue Neurontin.  Hypersomnia  Patient has been seen by sleep specialist who put her on Mirapex, Effexor, and Adderall. Hold Adderall.  Hyponatremia  Reflects profound hyperglycemia and should correct with correction of blood glucose.  Hyperkalemia  Reflect severe acidosis  and should correct as acidosis resolves. We'll likely need potassium supplementation.  DVT prophylaxis  Lovenox ordered.   Code Status: Full. Family Communication: Mother at the bedside. Disposition Plan: Home when stable.  Time spent: 70 minutes. The patient is critically ill.  [facility] Pager [phone] Cell: [phone]   If 7PM-7AM, please contact night-coverage [email or web address] Password [email or web address] 12/26/2013, 1:33 PM    **Disclaimer: This note was dictated with voice recognition software. Similar sounding words can inadvertently be transcribed and this note may contain transcription errors which may not have been corrected upon publication of note.**

## 2013-12-26 NOTE — ED Provider Notes (Signed)
CSN: [id number]     Arrival date & time 12/26/13  1059 History   First MD Initiated Contact with Patient 12/26/13 1112     Chief Complaint  Patient presents with  . Nausea  . Emesis  . Generalized Body Aches     (Consider location/radiation/quality/duration/timing/severity/associated sxs/prior Treatment) HPI Comments: Patient with a history of DM type I and Addison disease presents today with a chief complaint of nausea and generalized pain.  Patient reports that she began feeling nauseous yesterday.  She denies any vomiting.  Patient given IV Zofran by EMS en route to the ED, which she does not feel is helping.  She is also complaining of chest pain and abdominal pain.  She is unsure when she began having this pain.  She reports that the abdominal pain is mild and diffuse.  She reports some associated SOB.  She denies fever, chills, or diarrhea. When asked if she is compliant with her Insulin, she stated "I don't know."  Unable to obtain further history at this time due to AMS.  Patient answering all further questions, "I don't know."  Review of the chart shows that patient has had previous hospital admissions for DKA.   The history is provided by the patient.    Past Medical History  Diagnosis Date  . Diabetes mellitus   . Addison disease   . Allergy   . Addison disease   . Thyroid disease    Past Surgical History  Procedure Laterality Date  . Wisdom tooth extraction     Family History  Problem Relation Age of Onset  . Alzheimer's disease Maternal Grandmother   . Diabetes Paternal Grandmother   . Cancer Paternal Grandfather    History  Substance Use Topics  . Smoking status: Never Smoker   . Smokeless tobacco: Never Used  . Alcohol Use: 1.2 oz/week    2 Cans of beer per week     Comment: occasionally less than once a month, mixed drink   OB History   Grav Para Term Preterm Abortions TAB SAB Ect Mult Living                 Review of Systems  Unable to perform ROS:  Mental status change  Gastrointestinal: Positive for nausea and abdominal pain. Negative for diarrhea.      Allergies  Augmentin  Home Medications   Prior to Admission medications   Medication Sig Start Date End Date Taking? Authorizing Provider  cetirizine (ZYRTEC) 10 MG tablet Take 10 mg by mouth daily.    Historical Provider, MD  ERRIN 0.35 MG tablet TAKE 1 TABLET (0.35 MG TOTAL) BY MOUTH DAILY. 07/31/13   Shawnee Knapp, MD  fludrocortisone (FLORINEF) 0.1 MG tablet Take 0.05 mg by mouth daily.    Historical Provider, MD  gabapentin (NEURONTIN) 100 MG capsule Take 100-300 mg by mouth at bedtime.     Historical Provider, MD  HUMALOG 100 UNIT/ML injection Inject 1-10 Units into the skin 3 (three) times daily before meals. Per sliding scale 09/25/12   Historical Provider, MD  hydrocortisone (CORTEF) 10 MG tablet Take 1 tablet (10 mg total) by mouth 2 (two) times daily. 01/07/13   Gay Filler Copland, MD  insulin glargine (LANTUS) 100 UNIT/ML injection Inject 18 Units into the skin daily.     Historical Provider, MD  levothyroxine (SYNTHROID, LEVOTHROID) 75 MCG tablet Take 37.5 mcg by mouth daily before breakfast.    Historical Provider, MD  mupirocin ointment (BACTROBAN) 2 % Apply  1 application topically 3 (three) times daily. 06/10/13   Shawnee Knapp, MD  ramipril (ALTACE) 2.5 MG capsule Take 2.5 mg by mouth daily.    Historical Provider, MD  rOPINIRole (REQUIP) 0.25 MG tablet Take 0.25 mg by mouth at bedtime.     Historical Provider, MD  venlafaxine XR (EFFEXOR-XR) 150 MG 24 hr capsule Take 1 capsule (150 mg total) by mouth daily with breakfast. 11/16/13   Kathlee Nations, MD   BP 116/71  Pulse 103  Temp(Src) 97.8 F (36.6 C) (Oral)  Resp 20  SpO2 97% Physical Exam  Nursing note and vitals reviewed. Constitutional: She appears well-developed and well-nourished.  HENT:  Head: Normocephalic and atraumatic.  Cardiovascular: Normal rate, regular rhythm and normal heart sounds.   Pulmonary/Chest:  Effort normal and breath sounds normal.  Abdominal: Soft. She exhibits no distension and no mass. There is tenderness. There is no rebound and no guarding.  Mild diffuse abdominal tenderness  Musculoskeletal: Normal range of motion.  Neurological: She is alert.  Skin: Skin is warm and dry.  Psychiatric: She has a normal mood and affect.    ED Course  Procedures (including critical care time) Labs Review Labs Reviewed  CBC WITH DIFFERENTIAL  COMPREHENSIVE METABOLIC PANEL  LIPASE, BLOOD  URINALYSIS, ROUTINE W REFLEX MICROSCOPIC  CBG MONITORING, ED    Imaging Review No results found.   EKG Interpretation None     CRITICAL CARE Performed by: Hyman Bible   Total critical care time: 30 minutes  Critical care time was exclusive of separately billable procedures and treating other patients.  Critical care was necessary to treat or prevent imminent or life-threatening deterioration.  Critical care was time spent personally by me on the following activities: development of treatment plan with patient and/or surrogate as well as nursing, discussions with consultants, evaluation of patient's response to treatment, examination of patient, obtaining history from patient or surrogate, ordering and performing treatments and interventions, ordering and review of laboratory studies, ordering and review of radiographic studies, pulse oximetry and re-evaluation of patient's condition.  12:00 PM Reassessed patient.  She continues to feel very nauseous.  Nursing staff report that she has vomited.  Will order IV Reglan and then reassess.   1:00 PM Discussed with [facility] who has agreed to admit the patient.    MDM   Final diagnoses:  None   Patient with a history of DM type I presents today with nausea, generalized pain, and AMS.  Labs consistent with DKA.  VBG shows a pH of 7.012.  Patient also found to have an anion gap of >31.  Patient given aggressive hydration with IVF  and also started on the Glucostabilizer in the ED.  Patient admitted to [facility] for further management.      Hyman Bible, PA-C 12/29/13 2330

## 2013-12-26 NOTE — ED Notes (Signed)
Pt had 1 episode of emesis after Zofran admin, PA notified

## 2013-12-26 NOTE — ED Notes (Signed)
Bed: WA23 Expected date:  Expected time:  Means of arrival:  Comments: EMS n/v/d 

## 2013-12-26 NOTE — ED Notes (Addendum)
Pt from home via EMS-per EMS pt has hx of Addison's disease, states that she has been having N/V, pain all over x1 day. Pt received 4 Zofran and 250 ml NS bolus en route. Pt is A&O and in NAD.

## 2013-12-26 NOTE — ED Notes (Signed)
Patient denies pain and is resting comfortably.  

## 2013-12-26 NOTE — ED Notes (Signed)
Pt uncooperative when asked questions. Pt responds to most questions with " I don't know." Pt is in NAD

## 2013-12-27 DIAGNOSIS — F341 Dysthymic disorder: Secondary | ICD-10-CM

## 2013-12-27 DIAGNOSIS — F418 Other specified anxiety disorders: Secondary | ICD-10-CM | POA: Diagnosis present

## 2013-12-27 LAB — GLUCOSE, CAPILLARY
GLUCOSE-CAPILLARY: 133 mg/dL — AB (ref 70–99)
GLUCOSE-CAPILLARY: 192 mg/dL — AB (ref 70–99)
GLUCOSE-CAPILLARY: 288 mg/dL — AB (ref 70–99)
GLUCOSE-CAPILLARY: 224 mg/dL — AB (ref 70–99)
GLUCOSE-CAPILLARY: 146 mg/dL — AB (ref 70–99)
GLUCOSE-CAPILLARY: 106 mg/dL — AB (ref 70–99)
Glucose-Capillary: 126 mg/dL — ABNORMAL HIGH (ref 70–99)
Glucose-Capillary: 129 mg/dL — ABNORMAL HIGH (ref 70–99)
Glucose-Capillary: 130 mg/dL — ABNORMAL HIGH (ref 70–99)
Glucose-Capillary: 133 mg/dL — ABNORMAL HIGH (ref 70–99)
Glucose-Capillary: 138 mg/dL — ABNORMAL HIGH (ref 70–99)
Glucose-Capillary: 148 mg/dL — ABNORMAL HIGH (ref 70–99)
Glucose-Capillary: 148 mg/dL — ABNORMAL HIGH (ref 70–99)
Glucose-Capillary: 154 mg/dL — ABNORMAL HIGH (ref 70–99)
Glucose-Capillary: 155 mg/dL — ABNORMAL HIGH (ref 70–99)
Glucose-Capillary: 177 mg/dL — ABNORMAL HIGH (ref 70–99)
Glucose-Capillary: 183 mg/dL — ABNORMAL HIGH (ref 70–99)
Glucose-Capillary: 266 mg/dL — ABNORMAL HIGH (ref 70–99)
Glucose-Capillary: 283 mg/dL — ABNORMAL HIGH (ref 70–99)
Glucose-Capillary: 306 mg/dL — ABNORMAL HIGH (ref 70–99)
Glucose-Capillary: 348 mg/dL — ABNORMAL HIGH (ref 70–99)

## 2013-12-27 LAB — BASIC METABOLIC PANEL
BUN: 11 mg/dL (ref 6–23)
BUN: 11 mg/dL (ref 6–23)
BUN: 11 mg/dL (ref 6–23)
BUN: 11 mg/dL (ref 6–23)
BUN: 12 mg/dL (ref 6–23)
BUN: 12 mg/dL (ref 6–23)
BUN: 11 mg/dL (ref 6–23)
CALCIUM: 7.8 mg/dL — AB (ref 8.4–10.5)
CHLORIDE: 98 meq/L (ref 96–112)
CHLORIDE: 100 meq/L (ref 96–112)
CO2: 18 mEq/L — ABNORMAL LOW (ref 19–32)
CO2: 17 mEq/L — ABNORMAL LOW (ref 19–32)
CO2: 18 mEq/L — ABNORMAL LOW (ref 19–32)
CO2: 17 mEq/L — ABNORMAL LOW (ref 19–32)
CO2: 14 meq/L — AB (ref 19–32)
CO2: 19 meq/L (ref 19–32)
CO2: 19 meq/L (ref 19–32)
CREATININE: 0.61 mg/dL (ref 0.50–1.10)
Calcium: 7.6 mg/dL — ABNORMAL LOW (ref 8.4–10.5)
Calcium: 7.5 mg/dL — ABNORMAL LOW (ref 8.4–10.5)
Calcium: 7.7 mg/dL — ABNORMAL LOW (ref 8.4–10.5)
Calcium: 8.2 mg/dL — ABNORMAL LOW (ref 8.4–10.5)
Calcium: 7.8 mg/dL — ABNORMAL LOW (ref 8.4–10.5)
Calcium: 8.1 mg/dL — ABNORMAL LOW (ref 8.4–10.5)
Chloride: 97 mEq/L (ref 96–112)
Chloride: 95 mEq/L — ABNORMAL LOW (ref 96–112)
Chloride: 100 mEq/L (ref 96–112)
Chloride: 98 mEq/L (ref 96–112)
Chloride: 95 mEq/L — ABNORMAL LOW (ref 96–112)
Creatinine, Ser: 0.62 mg/dL (ref 0.50–1.10)
Creatinine, Ser: <0.2 mg/dL — ABNORMAL LOW (ref 0.50–1.10)
Creatinine, Ser: 0.61 mg/dL (ref 0.50–1.10)
Creatinine, Ser: 0.59 mg/dL (ref 0.50–1.10)
Creatinine, Ser: 0.58 mg/dL (ref 0.50–1.10)
Creatinine, Ser: 0.62 mg/dL (ref 0.50–1.10)
GFR calc Af Amer: >90 mL/min (ref >90)
GFR calc Af Amer: >90 mL/min (ref >90)
GFR calc Af Amer: >90 mL/min (ref >90)
GFR calc Af Amer: >90 mL/min (ref >90)
GFR calc Af Amer: >90 mL/min (ref >90)
GFR calc non Af Amer: >90 mL/min (ref >90)
GFR calc non Af Amer: >90 mL/min (ref >90)
GFR calc non Af Amer: >90 mL/min (ref >90)
GFR calc non Af Amer: >90 mL/min (ref >90)
GLUCOSE: 321 mg/dL — AB (ref 70–99)
GLUCOSE: 227 mg/dL — AB (ref 70–99)
Glucose, Bld: 182 mg/dL — ABNORMAL HIGH (ref 70–99)
Glucose, Bld: 153 mg/dL — ABNORMAL HIGH (ref 70–99)
Glucose, Bld: 146 mg/dL — ABNORMAL HIGH (ref 70–99)
Glucose, Bld: 142 mg/dL — ABNORMAL HIGH (ref 70–99)
Glucose, Bld: 156 mg/dL — ABNORMAL HIGH (ref 70–99)
POTASSIUM: 3.6 meq/L — AB (ref 3.7–5.3)
POTASSIUM: 3.4 meq/L — AB (ref 3.7–5.3)
Potassium: 4.1 mEq/L (ref 3.7–5.3)
Potassium: 3.7 mEq/L (ref 3.7–5.3)
Potassium: 4.1 mEq/L (ref 3.7–5.3)
Potassium: 4.4 mEq/L (ref 3.7–5.3)
Potassium: 4.3 mEq/L (ref 3.7–5.3)
SODIUM: 133 meq/L — AB (ref 137–147)
Sodium: 134 mEq/L — ABNORMAL LOW (ref 137–147)
Sodium: 129 mEq/L — ABNORMAL LOW (ref 137–147)
Sodium: 133 mEq/L — ABNORMAL LOW (ref 137–147)
Sodium: 134 mEq/L — ABNORMAL LOW (ref 137–147)
Sodium: 131 mEq/L — ABNORMAL LOW (ref 137–147)
Sodium: 134 mEq/L — ABNORMAL LOW (ref 137–147)

## 2013-12-27 LAB — HEMOGLOBIN A1C
HEMOGLOBIN A1C: 11.9 % — AB (ref <5.7)
Mean Plasma Glucose: 295 mg/dL — ABNORMAL HIGH (ref <117)

## 2013-12-27 LAB — CBC
HCT: 35 % — ABNORMAL LOW (ref 36.0–46.0)
HEMOGLOBIN: 13 g/dL (ref 12.0–15.0)
MCH: 31 pg (ref 26.0–34.0)
MCHC: 37.1 g/dL — AB (ref 30.0–36.0)
MCV: 83.5 fL (ref 78.0–100.0)
Platelets: 262 10*3/uL (ref 150–400)
RBC: 4.19 MIL/uL (ref 3.87–5.11)
RDW: 11.9 % (ref 11.5–15.5)
WBC: 9.1 10*3/uL (ref 4.0–10.5)

## 2013-12-27 LAB — RAPID STREP SCREEN (MED CTR MEBANE ONLY): STREPTOCOCCUS, GROUP A SCREEN (DIRECT): NEGATIVE

## 2013-12-27 NOTE — Progress Notes (Signed)
Inpatient Diabetes Program Recommendations  AACE/ADA: New Consensus Statement on Inpatient Glycemic Control (2013)  Target Ranges:  Prepandial:   less than 140 mg/dL      Peak postprandial:   less than 180 mg/dL (1-2 hours)      Critically ill patients:  140 - 180 mg/dL   Reason for Visit: MD consult  Diabetes history: type 1 Outpatient Diabetes medications: Per med rec, Humalog 1 to 10 units tid and Lantus 14 units at HS.  Per patient, typically takes 2 to 6 units Humalog tid with meals (CHO 1:14 and Correction Factor = 55) and Lantus 18 units at HS Current orders for Inpatient glycemic control: Insulin drip via GlucoStabilizer  Note:  Patient receptive to my visit.  Engaged in discussion.  Mother present.  Patient acknowledges that she does not remember anything prior to admission.  States she usually has this lapse of memory associated with DKA.  States that she probably took her insulin through Friday, possibly Saturday morning, but doubts she took any after that.    Has an app in her phone she uses as a tool to calculate her Humalog dosages.  Her target is set for 120 mg/dl.  Her Correction Factor is 55.  Insulin to carb ratio is 1:14.  In looking back in phone, a typical dose of Humalog is 2 to 6 units.  She states her Lantus dose is 18 units at HS.  Doesn't ever take any Humalog at HS and always eats a protein-containing snack at HS ever since she had a bad low during night.  Her goal is to test her CBG's 3 to 4 times/day.  Takes her meter with her to visits to Dr. Posey Pronto and his PA, Peri Jefferson.  States that she is likely going to get an Omni Pod pump and had planned to complete the paperwork over the weekend.  Doesn't like insulin pens-- prefers vial and syringe.  If she begins pump therapy, wants diabetes outpatient education such that she would have easy access to the Diabetes Educator for support.  She thinks she would get more support through the [facility]  than the CDE at Dr. Serita Grit office.  Patient really can't say why she has has stopped having frequent hospitalizations (Last admission was March 2014) except she has tried to care for herself such that she would stay out of the hospital.  For a while her insurance "wasn't very good".  Has been trying to test CBG's more.  Last went to [facility] July of 2014.  Had a couple of severe hypoglycemia episodes requiring EMS, but otherwise has stayed out of hospital.  When asked if she wanted to tell me anything else, she began telling me about her involvement in the local chapter of "[employer]".  Per patient, this group is nationwide.  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  Returned to room to ask patient about her psyco-social treatment.  Mother not present.  Patient states that she sees a psychiatrist about every 3 to 4 months.  She has started weekly sessions with a counselor-- has been weekly x 3 in a row until last week when she was out of town.  Has appointment with counselor tomorrow and states, "guess I had better cancel that one".  States  she has been taking Effexor.  Dosage was increased.  Before dosage increase was not able to attend a baby shower-- had been excited about it, but when time to go was too overwhelmed.  After increase in Effexor, was invited to another baby shower, was a little anxious about it, but was able to go.  Note inpatient psychiatric consult is planned.  When patient's labs indicate she is ready to transition to subcutaneous insulin, would recommend Lantus 18 units, Novolog 2 units tid with meals, and sensitive Novolog correction tid.  Will follow-up tomorrow and probably send referral to the [facility]. Thank you.  Leanor Voris S. Marcelline Mates, RN, CNS, CDE Inpatient Diabetes  Program, team pager [phone]

## 2013-12-27 NOTE — Progress Notes (Signed)
[id number]/Hope Holst, RN, BSN, CCM  [phone]  Chart Reviewed for discharge and hospital needs.  Discharge needs at time of review: None present will follow for needs.  Review of patient progress due on [id number]. 

## 2013-12-27 NOTE — Consult Note (Signed)
[facility] Face-to-Face Psychiatry Consult   Reason for Consult:  Anxiety, capacity Referring Physician:  Dr Rockne Menghini  [name] is an 33 y.o. female. Total Time spent with patient: 20 minutes  Assessment: AXIS I:  Depressive Disorder NOS AXIS II:  Deferred AXIS III:   Past Medical History  Diagnosis Date  . 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 Addison disease   . Allergy   . Thyroid disease   . Hypersomnia     On Adderall  . Diabetic neuropathy   . RLS (restless legs syndrome)    AXIS IV:  other psychosocial or environmental problems, problems related to social environment and problems with access to health care services AXIS V:  61-70 mild symptoms  Plan:  No evidence of imminent risk to self or others at present.   Patient does not meet criteria for psychiatric inpatient admission. Supportive therapy provided about ongoing stressors. Discussed crisis plan, support from social network, calling 911, coming to the Emergency Department, and calling Suicide Hotline.  Subjective:   [name] is a 33 y.o. female patient admitted with diabetes ketoacidosis.  HPI:  Patient is a 33 year old female who has a long history of type 1 diabetes mellitus, Addison's disease, hypothyroidism admitted because of increased blood sugar.  Psych consult was called because patient reported anxiety and her mother is concerned about her noncompliant with insulin.  Patient is known to this Probation officer from outpatient psychiatry .  Patient was last seen in the office on April 14,  her Effexor was increased to 150.  Patient is doing better on her medication but recently she was given Adderall by her neurologist because she was complaining of increased sleep during the day.  Patient reported past few weeks she is complaining of increased anxiety nervousness and tremors .  She stopped taking the Adderall a few days ago because she was not feeling well and she also stopped taking her insulin because  she was not sure what causing the tremors.  Upon admission she was very confused , lethargic and delirious.  Patient denies any depressive symptoms she is actually feeling much better since the Effexor dose was increased one month ago.  She has good energy level however she continued to have hypersomnia when the daytime.  Denies any hallucinations, paranoia, agitation, anger and depression.  She denies any active or passive suicidal thoughts.  Past Psychiatric History: Past Medical History  Diagnosis Date  . 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 Addison disease   . Allergy   . Thyroid disease   . Hypersomnia     On Adderall  . Diabetic neuropathy   . RLS (restless legs syndrome)     reports that she has never smoked. She has never used smokeless tobacco. She reports that she drinks about 1.2 ounces of alcohol per week. She reports that she does not use illicit drugs. Family History  Problem Relation Age of Onset  . Alzheimer's disease Maternal Grandmother   . Diabetes Paternal Grandmother   . Cancer Paternal Grandfather      Living Arrangements: Alone   Abuse/Neglect [facility]) Physical Abuse: Denies Verbal Abuse: Denies Sexual Abuse: Denies Allergies:   Allergies  Allergen Reactions  . Augmentin [Amoxicillin-Pot Clavulanate] Diarrhea    Past psychiatric history; Patient has no history of inpatient psychiatric treatment or any suicidal intent.  She had tried Zoloft, Celexa and Wellbutrin in the past with limited response.  Objective: Blood pressure 90/56, pulse 91, temperature  97.7 F (36.5 C), temperature source Oral, resp. rate 18, height _0  (1.549 m), weight 113 lb 5.1 oz (51.4 kg), SpO2 100.00%.Body mass index is 21.42 kg/(m^2). Results for orders placed during the hospital encounter of 12/26/13 (from the past 72 hour(s))  CBG MONITORING, ED     Status: Abnormal   Collection Time    12/26/13 11:25 AM      Result Value Ref Range   Glucose-Capillary  452 (*) 70 - 99 mg/dL   Comment 1 Notify RN    BLOOD GAS, VENOUS     Status: Abnormal   Collection Time    12/26/13 11:42 AM      Result Value Ref Range   pH, Ven 7.012 (*) 7.250 - 7.300   Comment: CRITICAL RESULT CALLED TO, READ BACK BY AND VERIFIED WITH:     DR. [email or web address] AT 1153 BY T.BURGESS,RRT,RCP ON 12/26/2013   pCO2, Ven 23.1 (*) 45.0 - 50.0 mmHg   pO2, Ven 43.4  30.0 - 45.0 mmHg   Bicarbonate 5.6 (*) 20.0 - 24.0 mEq/L   TCO2 5.3  0 - 100 mmol/L   Acid-base deficit 27.4 (*) 0.0 - 2.0 mmol/L   O2 Saturation 70.3     Patient temperature 98.6     Collection site VEIN     Drawn by COLLECTED BY LABORATORY     Sample type VENOUS    CBC WITH DIFFERENTIAL     Status: Abnormal   Collection Time    12/26/13 11:45 AM      Result Value Ref Range   WBC 17.1 (*) 4.0 - 10.5 K/uL   RBC 5.58 (*) 3.87 - 5.11 MIL/uL   Hemoglobin 18.0 (*) 12.0 - 15.0 g/dL   HCT 49.4 (*) 36.0 - 46.0 %   MCV 88.5  78.0 - 100.0 fL   MCH 32.3  26.0 - 34.0 pg   MCHC 36.4 (*) 30.0 - 36.0 g/dL   RDW 12.1  11.5 - 15.5 %   Platelets 286  150 - 400 K/uL   Neutrophils Relative % 71  43 - 77 %   Lymphocytes Relative 19  12 - 46 %   Monocytes Relative 10  3 - 12 %   Eosinophils Relative 0  0 - 5 %   Basophils Relative 0  0 - 1 %   Neutro Abs 12.1 (*) 1.7 - 7.7 K/uL   Lymphs Abs 3.3  0.7 - 4.0 K/uL   Monocytes Absolute 1.7 (*) 0.1 - 1.0 K/uL   Eosinophils Absolute 0.0  0.0 - 0.7 K/uL   Basophils Absolute 0.0  0.0 - 0.1 K/uL   Smear Review MORPHOLOGY UNREMARKABLE    COMPREHENSIVE METABOLIC PANEL     Status: Abnormal   Collection Time    12/26/13 11:45 AM      Result Value Ref Range   Sodium 123 (*) 137 - 147 mEq/L   Potassium 6.3 (*) 3.7 - 5.3 mEq/L   Chloride 85 (*) 96 - 112 mEq/L   CO2 <7 (*) 19 - 32 mEq/L   Comment: CRITICAL RESULT CALLED TO, READ BACK BY AND VERIFIED WITH:     E.POSTEN RN AT 1225 ON 24MAY15 BY C.BONGEL   Glucose, Bld 473 (*) 70 - 99 mg/dL   BUN 29 (*) 6 - 23 mg/dL   Creatinine, Ser 0.86   0.50 - 1.10 mg/dL   Calcium 9.7  8.4 - 10.5 mg/dL   Total Protein 7.8  6.0 - 8.3 g/dL   Albumin  4.1  3.5 - 5.2 g/dL   AST 30  0 - 37 U/L   Comment: SLIGHT HEMOLYSIS     HEMOLYSIS AT THIS LEVEL MAY AFFECT RESULT   ALT 23  0 - 35 U/L   Alkaline Phosphatase 137 (*) 39 - 117 U/L   Total Bilirubin <0.2 (*) 0.3 - 1.2 mg/dL   GFR calc non Af Amer 87 (*) >90 mL/min   GFR calc Af Amer >90  >90 mL/min   Comment: (NOTE)     The eGFR has been calculated using the CKD EPI equation.     This calculation has not been validated in all clinical situations.     eGFR's persistently <90 mL/min signify possible Chronic Kidney     Disease.  LIPASE, BLOOD     Status: None   Collection Time    12/26/13 11:45 AM      Result Value Ref Range   Lipase 12  11 - 59 U/L  CBG MONITORING, ED     Status: Abnormal   Collection Time    12/26/13 12:51 PM      Result Value Ref Range   Glucose-Capillary 397 (*) 70 - 99 mg/dL  URINALYSIS, ROUTINE W REFLEX MICROSCOPIC     Status: Abnormal   Collection Time    12/26/13  1:32 PM      Result Value Ref Range   Color, Urine YELLOW  YELLOW   APPearance CLEAR  CLEAR   Specific Gravity, Urine 1.020  1.005 - 1.030   pH 5.0  5.0 - 8.0   Glucose, UA >1000 (*) NEGATIVE mg/dL   Hgb urine dipstick MODERATE (*) NEGATIVE   Bilirubin Urine NEGATIVE  NEGATIVE   Ketones, ur >80 (*) NEGATIVE mg/dL   Protein, ur 30 (*) NEGATIVE mg/dL   Urobilinogen, UA 0.2  0.0 - 1.0 mg/dL   Nitrite NEGATIVE  NEGATIVE   Leukocytes, UA NEGATIVE  NEGATIVE  URINE RAPID DRUG SCREEN (HOSP PERFORMED)     Status: None   Collection Time    12/26/13  1:32 PM      Result Value Ref Range   Opiates NONE DETECTED  NONE DETECTED   Cocaine NONE DETECTED  NONE DETECTED   Benzodiazepines NONE DETECTED  NONE DETECTED   Amphetamines NONE DETECTED  NONE DETECTED   Tetrahydrocannabinol NONE DETECTED  NONE DETECTED   Barbiturates NONE DETECTED  NONE DETECTED   Comment:            DRUG SCREEN FOR MEDICAL PURPOSES      ONLY.  IF CONFIRMATION IS NEEDED     FOR ANY PURPOSE, NOTIFY LAB     WITHIN 5 DAYS.                LOWEST DETECTABLE LIMITS     FOR URINE DRUG SCREEN     Drug Class       Cutoff (ng/mL)     Amphetamine      1000     Barbiturate      200     Benzodiazepine   035     Tricyclics       465     Opiates          300     Cocaine          300     THC              50  URINE MICROSCOPIC-ADD ON     Status: Abnormal  Collection Time    12/26/13  1:32 PM      Result Value Ref Range   Squamous Epithelial / LPF RARE  RARE   WBC, UA 0-2  <3 WBC/hpf   Casts GRANULAR CAST (*) NEGATIVE  I-STAT CHEM 8, ED     Status: Abnormal   Collection Time    12/26/13  1:44 PM      Result Value Ref Range   Sodium 131 (*) 137 - 147 mEq/L   Potassium 5.5 (*) 3.7 - 5.3 mEq/L   Chloride 109  96 - 112 mEq/L   BUN 25 (*) 6 - 23 mg/dL   Creatinine, Ser 0.70  0.50 - 1.10 mg/dL   Glucose, Bld 372 (*) 70 - 99 mg/dL   Calcium, Ion 1.09 (*) 1.12 - 1.23 mmol/L   TCO2 8  0 - 100 mmol/L   Hemoglobin 17.0 (*) 12.0 - 15.0 g/dL   HCT 50.0 (*) 36.0 - 46.0 %   Comment NOTIFIED PHYSICIAN    CBG MONITORING, ED     Status: Abnormal   Collection Time    12/26/13  2:02 PM      Result Value Ref Range   Glucose-Capillary 318 (*) 70 - 99 mg/dL  MRSA PCR SCREENING     Status: None   Collection Time    12/26/13  2:47 PM      Result Value Ref Range   MRSA by PCR NEGATIVE  NEGATIVE   Comment:            The GeneXpert MRSA Assay (FDA     approved for NASAL specimens     only), is one component of a     comprehensive MRSA colonization     surveillance program. It is not     intended to diagnose MRSA     infection nor to guide or     monitor treatment for     MRSA infections.  GLUCOSE, CAPILLARY     Status: Abnormal   Collection Time    12/26/13  2:55 PM      Result Value Ref Range   Glucose-Capillary 214 (*) 70 - 99 mg/dL  BASIC METABOLIC PANEL     Status: Abnormal   Collection Time    12/26/13  3:52 PM       Result Value Ref Range   Sodium 129 (*) 137 - 147 mEq/L   Potassium 4.7  3.7 - 5.3 mEq/L   Chloride 100  96 - 112 mEq/L   Comment: DELTA CHECK NOTED   CO2 7 (*) 19 - 32 mEq/L   Comment: CRITICAL RESULT CALLED TO, READ BACK BY AND VERIFIED WITH:     S.DILLON RN AT 1638 ON 24MAY15 BY C.BONGEL   Glucose, Bld 183 (*) 70 - 99 mg/dL   BUN 20  6 - 23 mg/dL   Creatinine, Ser 0.69  0.50 - 1.10 mg/dL   Calcium 7.1 (*) 8.4 - 10.5 mg/dL   GFR calc non Af Amer >90  >90 mL/min   GFR calc Af Amer >90  >90 mL/min   Comment: (NOTE)     The eGFR has been calculated using the CKD EPI equation.     This calculation has not been validated in all clinical situations.     eGFR's persistently <90 mL/min signify possible Chronic Kidney     Disease.  GLUCOSE, CAPILLARY     Status: Abnormal   Collection Time    12/26/13  3:59 PM  Result Value Ref Range   Glucose-Capillary 171 (*) 70 - 99 mg/dL  GLUCOSE, CAPILLARY     Status: Abnormal   Collection Time    12/26/13  5:03 PM      Result Value Ref Range   Glucose-Capillary 165 (*) 70 - 99 mg/dL  GLUCOSE, CAPILLARY     Status: Abnormal   Collection Time    12/26/13  6:08 PM      Result Value Ref Range   Glucose-Capillary 167 (*) 70 - 99 mg/dL  BASIC METABOLIC PANEL     Status: Abnormal   Collection Time    12/26/13  6:18 PM      Result Value Ref Range   Sodium 132 (*) 137 - 147 mEq/L   Potassium 5.0  3.7 - 5.3 mEq/L   Chloride 97  96 - 112 mEq/L   CO2 12 (*) 19 - 32 mEq/L   Glucose, Bld 187 (*) 70 - 99 mg/dL   BUN 18  6 - 23 mg/dL   Creatinine, Ser 0.69  0.50 - 1.10 mg/dL   Calcium 7.6 (*) 8.4 - 10.5 mg/dL   GFR calc non Af Amer >90  >90 mL/min   GFR calc Af Amer >90  >90 mL/min   Comment: (NOTE)     The eGFR has been calculated using the CKD EPI equation.     This calculation has not been validated in all clinical situations.     eGFR's persistently <90 mL/min signify possible Chronic Kidney     Disease.  GLUCOSE, CAPILLARY     Status:  Abnormal   Collection Time    12/26/13  7:11 PM      Result Value Ref Range   Glucose-Capillary 173 (*) 70 - 99 mg/dL   Comment 1 Documented in Chart     Comment 2 Notify RN    BASIC METABOLIC PANEL     Status: Abnormal   Collection Time    12/26/13  8:17 PM      Result Value Ref Range   Sodium 133 (*) 137 - 147 mEq/L   Potassium 5.3  3.7 - 5.3 mEq/L   Chloride 99  96 - 112 mEq/L   CO2 8 (*) 19 - 32 mEq/L   Comment: CRITICAL RESULT CALLED TO, READ BACK BY AND VERIFIED WITH:     MICHELLE, RN AT 2105 ON 12/26/13 BY W.SHEA   Glucose, Bld 197 (*) 70 - 99 mg/dL   BUN 15  6 - 23 mg/dL   Creatinine, Ser 0.66  0.50 - 1.10 mg/dL   Calcium 7.7 (*) 8.4 - 10.5 mg/dL   GFR calc non Af Amer >90  >90 mL/min   GFR calc Af Amer >90  >90 mL/min   Comment: (NOTE)     The eGFR has been calculated using the CKD EPI equation.     This calculation has not been validated in all clinical situations.     eGFR's persistently <90 mL/min signify possible Chronic Kidney     Disease.  GLUCOSE, CAPILLARY     Status: Abnormal   Collection Time    12/26/13  8:17 PM      Result Value Ref Range   Glucose-Capillary 196 (*) 70 - 99 mg/dL  GLUCOSE, CAPILLARY     Status: Abnormal   Collection Time    12/26/13  9:00 PM      Result Value Ref Range   Glucose-Capillary 178 (*) 70 - 99 mg/dL  BASIC METABOLIC PANEL  Status: Abnormal   Collection Time    12/26/13 10:00 PM      Result Value Ref Range   Sodium 133 (*) 137 - 147 mEq/L   Potassium 5.4 (*) 3.7 - 5.3 mEq/L   Chloride 98  96 - 112 mEq/L   CO2 10 (*) 19 - 32 mEq/L   Comment: CRITICAL RESULT CALLED TO, READ BACK BY AND VERIFIED WITH:     MICHELLE,RN AT 2230 ON 12/26/13 BY W.SHEA   Glucose, Bld 201 (*) 70 - 99 mg/dL   BUN 13  6 - 23 mg/dL   Creatinine, Ser 0.60  0.50 - 1.10 mg/dL   Calcium 7.6 (*) 8.4 - 10.5 mg/dL   GFR calc non Af Amer >90  >90 mL/min   GFR calc Af Amer >90  >90 mL/min   Comment: (NOTE)     The eGFR has been calculated using the CKD  EPI equation.     This calculation has not been validated in all clinical situations.     eGFR's persistently <90 mL/min signify possible Chronic Kidney     Disease.  GLUCOSE, CAPILLARY     Status: Abnormal   Collection Time    12/26/13 10:11 PM      Result Value Ref Range   Glucose-Capillary 177 (*) 70 - 99 mg/dL  GLUCOSE, CAPILLARY     Status: Abnormal   Collection Time    12/26/13 11:07 PM      Result Value Ref Range   Glucose-Capillary 184 (*) 70 - 99 mg/dL  GLUCOSE, CAPILLARY     Status: Abnormal   Collection Time    12/27/13 12:16 AM      Result Value Ref Range   Glucose-Capillary 183 (*) 70 - 99 mg/dL  BASIC METABOLIC PANEL     Status: Abnormal   Collection Time    12/27/13  1:00 AM      Result Value Ref Range   Sodium 134 (*) 137 - 147 mEq/L   Potassium 4.1  3.7 - 5.3 mEq/L   Comment: DELTA CHECK NOTED     REPEATED TO VERIFY   Chloride 97  96 - 112 mEq/L   CO2 18 (*) 19 - 32 mEq/L   Glucose, Bld 182 (*) 70 - 99 mg/dL   BUN 11  6 - 23 mg/dL   Creatinine, Ser 0.62  0.50 - 1.10 mg/dL   Calcium 7.6 (*) 8.4 - 10.5 mg/dL   GFR calc non Af Amer >90  >90 mL/min   GFR calc Af Amer >90  >90 mL/min   Comment: (NOTE)     The eGFR has been calculated using the CKD EPI equation.     This calculation has not been validated in all clinical situations.     eGFR's persistently <90 mL/min signify possible Chronic Kidney     Disease.  GLUCOSE, CAPILLARY     Status: Abnormal   Collection Time    12/27/13  1:15 AM      Result Value Ref Range   Glucose-Capillary 154 (*) 70 - 99 mg/dL  GLUCOSE, CAPILLARY     Status: Abnormal   Collection Time    12/27/13  2:13 AM      Result Value Ref Range   Glucose-Capillary 155 (*) 70 - 99 mg/dL  BASIC METABOLIC PANEL     Status: Abnormal   Collection Time    12/27/13  2:51 AM      Result Value Ref Range   Sodium 129 (*) 137 -  147 mEq/L   Potassium 3.7  3.7 - 5.3 mEq/L   Chloride 95 (*) 96 - 112 mEq/L   CO2 17 (*) 19 - 32 mEq/L   Glucose,  Bld 153 (*) 70 - 99 mg/dL   BUN 11  6 - 23 mg/dL   Creatinine, Ser <0.20 (*) 0.50 - 1.10 mg/dL   Comment: REPEATED TO VERIFY     DELTA CHECK NOTED   Calcium 7.5 (*) 8.4 - 10.5 mg/dL   GFR calc non Af Amer NOT CALCULATED  >90 mL/min   GFR calc Af Amer NOT CALCULATED  >90 mL/min   Comment: (NOTE)     The eGFR has been calculated using the CKD EPI equation.     This calculation has not been validated in all clinical situations.     eGFR's persistently <90 mL/min signify possible Chronic Kidney     Disease.  GLUCOSE, CAPILLARY     Status: Abnormal   Collection Time    12/27/13  3:22 AM      Result Value Ref Range   Glucose-Capillary 133 (*) 70 - 99 mg/dL  BASIC METABOLIC PANEL     Status: Abnormal   Collection Time    12/27/13  4:24 AM      Result Value Ref Range   Sodium 133 (*) 137 - 147 mEq/L   Potassium 4.1  3.7 - 5.3 mEq/L   Chloride 100  96 - 112 mEq/L   CO2 18 (*) 19 - 32 mEq/L   Glucose, Bld 146 (*) 70 - 99 mg/dL   BUN 11  6 - 23 mg/dL   Creatinine, Ser 0.61  0.50 - 1.10 mg/dL   Comment: DELTA CHECK NOTED     REPEATED TO VERIFY   Calcium 7.7 (*) 8.4 - 10.5 mg/dL   GFR calc non Af Amer >90  >90 mL/min   GFR calc Af Amer >90  >90 mL/min   Comment: (NOTE)     The eGFR has been calculated using the CKD EPI equation.     This calculation has not been validated in all clinical situations.     eGFR's persistently <90 mL/min signify possible Chronic Kidney     Disease.  CBC     Status: Abnormal   Collection Time    12/27/13  4:24 AM      Result Value Ref Range   WBC 9.1  4.0 - 10.5 K/uL   RBC 4.19  3.87 - 5.11 MIL/uL   Hemoglobin 13.0  12.0 - 15.0 g/dL   Comment: RESULT REPEATED AND VERIFIED     DELTA CHECK NOTED   HCT 35.0 (*) 36.0 - 46.0 %   MCV 83.5  78.0 - 100.0 fL   MCH 31.0  26.0 - 34.0 pg   MCHC 37.1 (*) 30.0 - 36.0 g/dL   Comment: RULED OUT INTERFERING SUBSTANCES   RDW 11.9  11.5 - 15.5 %   Platelets 262  150 - 400 K/uL  GLUCOSE, CAPILLARY     Status:  Abnormal   Collection Time    12/27/13  4:41 AM      Result Value Ref Range   Glucose-Capillary 138 (*) 70 - 99 mg/dL  GLUCOSE, CAPILLARY     Status: Abnormal   Collection Time    12/27/13  5:40 AM      Result Value Ref Range   Glucose-Capillary 126 (*) 70 - 99 mg/dL  GLUCOSE, CAPILLARY     Status: Abnormal   Collection Time  12/27/13  6:34 AM      Result Value Ref Range   Glucose-Capillary 130 (*) 70 - 99 mg/dL  BASIC METABOLIC PANEL     Status: Abnormal   Collection Time    12/27/13  6:45 AM      Result Value Ref Range   Sodium 134 (*) 137 - 147 mEq/L   Potassium 4.4  3.7 - 5.3 mEq/L   Chloride 98  96 - 112 mEq/L   CO2 17 (*) 19 - 32 mEq/L   Glucose, Bld 142 (*) 70 - 99 mg/dL   BUN 11  6 - 23 mg/dL   Creatinine, Ser 0.59  0.50 - 1.10 mg/dL   Calcium 8.2 (*) 8.4 - 10.5 mg/dL   GFR calc non Af Amer >90  >90 mL/min   GFR calc Af Amer >90  >90 mL/min   Comment: (NOTE)     The eGFR has been calculated using the CKD EPI equation.     This calculation has not been validated in all clinical situations.     eGFR's persistently <90 mL/min signify possible Chronic Kidney     Disease.  GLUCOSE, CAPILLARY     Status: Abnormal   Collection Time    12/27/13  7:32 AM      Result Value Ref Range   Glucose-Capillary 129 (*) 70 - 99 mg/dL   Comment 1 Notify RN     Comment 2 Documented in Chart    GLUCOSE, CAPILLARY     Status: Abnormal   Collection Time    12/27/13  8:47 AM      Result Value Ref Range   Glucose-Capillary 177 (*) 70 - 99 mg/dL  GLUCOSE, CAPILLARY     Status: Abnormal   Collection Time    12/27/13  9:55 AM      Result Value Ref Range   Glucose-Capillary 192 (*) 70 - 99 mg/dL  GLUCOSE, CAPILLARY     Status: Abnormal   Collection Time    12/27/13 10:55 AM      Result Value Ref Range   Glucose-Capillary 266 (*) 70 - 99 mg/dL  RAPID STREP SCREEN     Status: None   Collection Time    12/27/13 11:00 AM      Result Value Ref Range   Streptococcus, Group A Screen  (Direct) NEGATIVE  NEGATIVE   Comment: (NOTE)     A Rapid Antigen test may result negative if the antigen level in the     sample is below the detection level of this test. The FDA has not     cleared this test as a stand-alone test therefore the rapid antigen     negative result has reflexed to a Group A Strep culture.  BASIC METABOLIC PANEL     Status: Abnormal   Collection Time    12/27/13 11:52 AM      Result Value Ref Range   Sodium 131 (*) 137 - 147 mEq/L   Potassium 4.3  3.7 - 5.3 mEq/L   Chloride 95 (*) 96 - 112 mEq/L   CO2 14 (*) 19 - 32 mEq/L   Glucose, Bld 321 (*) 70 - 99 mg/dL   BUN 12  6 - 23 mg/dL   Creatinine, Ser 0.61  0.50 - 1.10 mg/dL   Calcium 7.8 (*) 8.4 - 10.5 mg/dL   GFR calc non Af Amer >90  >90 mL/min   GFR calc Af Amer >90  >90 mL/min   Comment: (NOTE)  The eGFR has been calculated using the CKD EPI equation.     This calculation has not been validated in all clinical situations.     eGFR's persistently <90 mL/min signify possible Chronic Kidney     Disease.  GLUCOSE, CAPILLARY     Status: Abnormal   Collection Time    12/27/13 11:59 AM      Result Value Ref Range   Glucose-Capillary 283 (*) 70 - 99 mg/dL   Labs are reviewed.  Current Facility-Administered Medications  Medication Dose Route Frequency Provider Last Rate Last Dose  . 0.9 %  sodium chloride infusion   Intravenous Continuous Venetia Maxon Rama, MD 100 mL/hr at 12/27/13 1330    . dextrose 5 %-0.45 % sodium chloride infusion   Intravenous Continuous Venetia Maxon Rama, MD 50 mL/hr at 12/27/13 1506    . dextrose 50 % solution 25 mL  25 mL Intravenous PRN Christina P Rama, MD      . enoxaparin (LOVENOX) injection 40 mg  40 mg Subcutaneous Q24H Venetia Maxon Rama, MD   40 mg at 12/27/13 1506  . fludrocortisone (FLORINEF) tablet 0.05 mg  0.05 mg Oral Daily Venetia Maxon Rama, MD   0.05 mg at 12/27/13 1029  . gabapentin (NEURONTIN) capsule 100-300 mg  100-300 mg Oral QHS Venetia Maxon Rama, MD   300 mg  at 12/26/13 2245  . hydrocortisone sodium succinate (SOLU-CORTEF) 100 MG injection 50 mg  50 mg Intravenous Q8H Christina P Rama, MD   50 mg at 12/27/13 1402  . insulin regular (NOVOLIN R,HUMULIN R) 1 Units/mL in sodium chloride 0.9 % 100 mL infusion   Intravenous Continuous Venetia Maxon Rama, MD 14.4 mL/hr at 12/27/13 1404 14.4 Units/hr at 12/27/13 1404  . levothyroxine (SYNTHROID, LEVOTHROID) tablet 37.5 mcg  37.5 mcg Oral QAC breakfast Venetia Maxon Rama, MD   37.5 mcg at 12/27/13 0802  . loratadine (CLARITIN) tablet 10 mg  10 mg Oral Daily Venetia Maxon Rama, MD   10 mg at 12/27/13 1030  . norethindrone (MICRONOR,CAMILA,ERRIN) 0.35 MG tablet 0.35 mg  1 tablet Oral Daily Christina P Rama, MD      . ondansetron (ZOFRAN) injection 4 mg  4 mg Intravenous 4 times per day Venetia Maxon Rama, MD   4 mg at 12/27/13 1157  . pantoprazole (PROTONIX) injection 40 mg  40 mg Intravenous Daily Venetia Maxon Rama, MD   40 mg at 12/27/13 1030  . rOPINIRole (REQUIP) tablet 0.25 mg  0.25 mg Oral QHS Venetia Maxon Rama, MD   0.25 mg at 12/26/13 2244  . venlafaxine XR (EFFEXOR-XR) 24 hr capsule 150 mg  150 mg Oral Q breakfast Venetia Maxon Rama, MD   150 mg at 12/27/13 0802    Psychiatric Specialty Exam:     Blood pressure 90/56, pulse 91, temperature 97.7 F (36.5 C), temperature source Oral, resp. rate 18, height _0  (1.549 m), weight 113 lb 5.1 oz (51.4 kg), SpO2 100.00%.Body mass index is 21.42 kg/(m^2).  General Appearance: Casual  Eye Contact::  Good  Speech:  Normal Rate  Volume:  Normal  Mood:  Anxious  Affect:  Congruent  Thought Process:  Coherent, Intact and Logical  Orientation:  Full (Time, Place, and Person)  Thought Content:  WDL  Suicidal Thoughts:  No  Homicidal Thoughts:  No  Memory:  Immediate;   Good Recent;   Good Remote;   Good  Judgement:  Good  Insight:  Good  Psychomotor Activity:  Normal  Concentration:  Good  Recall:  Roel Cluck of Knowledge:Good  Language: Good  Akathisia:  No   Handed:  Right  AIMS (if indicated):     Assets:  Communication Skills Desire for Improvement Housing Social Support  Sleep:      Musculoskeletal: Strength & Muscle Tone: within normal limits Gait & Station: normal Patient leans: N/A  Treatment Plan Summary: Continue present psychotropic medication., she is taking Effexor 150 mg daily.  Patient has stopped Adderall however I recommended to contact her neurologist Dr. Algie Coffer and informed about stopping Adderall.  Recommended outpatient medication management with this writer which he has been doing it.  Patient has capacity to participate in her treatment plan.  Patient does not require inpatient psychiatric services at this time.  Arlyce Harman Arfeen 12/27/2013 3:07 PM

## 2013-12-27 NOTE — Progress Notes (Addendum)
Progress Note   [name] KGM:[id number] DOB: February 08, 1981 DOA: 12/26/2013 PCP: Cari Caraway, MD   Brief Narrative:   [name] is an 33 y.o. female with a PMH of type 1 diabetes on Lantus/NovoLog, Addison's disease and hypothyroidism who was admitted 12/26/13 in DKA.  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  -Continue insulin drip per glucose stabilizer protocol with every hour CBG checks and every 2 hour BMET checks until stable. -Supplement potassium if needed. Currently no supplementation required.  -Dextrose added to IV fluids when serum glucose less than 250.  -Transition to basal/bolus insulin when the ketoacidosis has resolved and the patient is able to eat. Continue IV insulin infusion for one to two hours after initiating the SQ insulin, to avoid recurrent hyperglycemia.  -Given a total of 150 mEq of sodium bicarbonate for severe acidosis.  -CBG is 126-155, but still mildly acidotic with an anion gap of 20. -Unclear trigger, although the patient complains of a sore throat. No signs of pneumonia on CXR. U/A negative for nitrites/leukocytes. UDS negative. Check rapid strep screen.  Active Problems: Depression/Anxiety -Actively involved in therapy, has an outpatient psychiatrist but is socially isolated with little local support. -Patient's mother is concerned that the patient's psychiatric treatment isn't being optimally managed. -Inpatient psychiatric consultation requested since the patient's lack of self care is potentially life threatening. -Continue Effexor, ? Alternative regimen to help regulate sleep patterns.  Addison disease  -Continue stress dose hydrocortisone, continue fludrocortisone.  Restless leg  -Continue Mirapex.  Thyroid disease  -Continue  Synthroid.  Diabetic neuropathy  -Continue Neurontin.  Hypersomnia  -Patient has been seen by sleep specialist who put her on Mirapex, Effexor, and Adderall. Hold Adderall.  Hyponatremia  -Improved with correction of severe hyperglycemia.  Hyperkalemia  -Resolved with improvement in acidosis.  DVT prophylaxis  -Lovenox ordered.  Code Status: Full.  Family Communication: Mother at the bedside.  Disposition Plan: Home when stable.   IV Access:    Peripheral IV   Procedures:    None.   Medical Consultants:    None.   Other Consultants:    Diabetes coordinator   Anti-Infectives:    None.  Subjective:   [name] has no recollection of the events leading up to her hospitalization. Her last memory is from several days ago, when she says she has had sore throat which is persistent. She was unable to tell me much about her disease process or management, although she says she takes her insulin regularly. She seems to know that she should take stress dose steroids when she is ill, but it is unclear if she actually did this. She also suffers from hypersomnia as well as difficult sleep latency, staying up until 2 AM but then being difficult to arouse in the mornings, which is exacerbating her diabetic treatment. She often misses insulin doses and meals because of sleep. Currently, she is not complaining of any nausea, vomiting, or ongoing trouble with her respirations.  Objective:    Filed Vitals:   12/26/13 1700 12/26/13 2000 12/27/13 0000 12/27/13 0400  BP: 118/63 106/71 94/54 102/61  Pulse: 105 107 98 88  Temp:  99.4 F (37.4 C) 99.5 F (37.5 C) 97.9 F (36.6 C)  TempSrc:  Oral Oral Oral  Resp: 16 16 13 12   Height:  Weight:      SpO2: 100% 100% 98% 98%    Intake/Output Summary (Last 24 hours) at 12/27/13 0718 Last data filed at 12/27/13 0600  Gross per 24 hour  Intake 1998.12 ml  Output    650 ml  Net 1348.12 ml    Exam: Gen:   NAD Throat: No tonsillar or posterior pharyngeal exudates Cardiovascular:  RRR, No M/R/G Respiratory:  Lungs CTAB Gastrointestinal:  Abdomen soft, NT/ND, + BS Extremities:  No C/E/C   Data Reviewed:    Labs: Basic Metabolic Panel:  Recent Labs Lab 12/26/13 2200 12/27/13 0100 12/27/13 0251 12/27/13 0424 12/27/13 0645  NA 133* 134* 129* 133* 134*  K 5.4* 4.1 3.7 4.1 4.4  CL 98 97 95* 100 98  CO2 10* 18* 17* 18* 17*  GLUCOSE 201* 182* 153* 146* 142*  BUN 13 11 11 11 11   CREATININE 0.60 0.62 <0.20* 0.61 0.59  CALCIUM 7.6* 7.6* 7.5* 7.7* 8.2*   GFR Estimated Creatinine Clearance: 74.1 ml/min (by C-G formula based on Cr of 0.59). Liver Function Tests:  Recent Labs Lab 12/26/13 1145  AST 30  ALT 23  ALKPHOS 137*  BILITOT <0.2*  PROT 7.8  ALBUMIN 4.1    Recent Labs Lab 12/26/13 1145  LIPASE 12   CBC:  Recent Labs Lab 12/26/13 1145 12/26/13 1344  WBC 17.1*  --   NEUTROABS 12.1*  --   HGB 18.0* 17.0*  HCT 49.4* 50.0*  MCV 88.5  --   PLT 286  --    CBG:  Recent Labs Lab 12/27/13 0115 12/27/13 0213 12/27/13 0441 12/27/13 0540 12/27/13 0634  GLUCAP 154* 155* 138* 126* 130*   Sepsis Labs:  Recent Labs Lab 12/26/13 1145  WBC 17.1*   Microbiology Recent Results (from the past 240 hour(s))  MRSA PCR SCREENING     Status: None   Collection Time    12/26/13  2:47 PM      Result Value Ref Range Status   MRSA by PCR NEGATIVE  NEGATIVE Final   Comment:            The GeneXpert MRSA Assay (FDA     approved for NASAL specimens     only), is one component of a     comprehensive MRSA colonization     surveillance program. It is not     intended to diagnose MRSA     infection nor to guide or     monitor treatment for     MRSA infections.     Radiographs/Studies:   Dg Chest 2 View  12/26/2013   CLINICAL DATA:  Chest pain and shortness of breath  EXAM: CHEST  2 VIEW  COMPARISON:  Prior chest x-ray 10/18/2012  FINDINGS: The lungs are clear and  negative for focal airspace consolidation, pulmonary edema or suspicious pulmonary nodule. No pleural effusion or pneumothorax. Cardiac and mediastinal contours are within normal limits. No acute fracture or lytic or blastic osseous lesions. The visualized upper abdominal bowel gas pattern is unremarkable.  IMPRESSION: No active cardiopulmonary disease.   Electronically Signed   By: Jacqulynn Cadet M.D.   On: 12/26/2013 12:25    Medications:   . enoxaparin (LOVENOX) injection  40 mg Subcutaneous Q24H  . fludrocortisone  0.05 mg Oral Daily  . gabapentin  100-300 mg Oral QHS  . hydrocortisone sod succinate (SOLU-CORTEF) inj  50 mg Intravenous Q8H  . levothyroxine  37.5 mcg Oral QAC breakfast  . loratadine  10 mg Oral Daily  .  norethindrone  1 tablet Oral Daily  . ondansetron (ZOFRAN) IV  4 mg Intravenous 4 times per day  . pantoprazole (PROTONIX) IV  40 mg Intravenous Daily  . rOPINIRole  0.25 mg Oral QHS  . venlafaxine XR  150 mg Oral Q breakfast   Continuous Infusions: . sodium chloride Stopped (12/26/13 2021)  . dextrose 5 % and 0.45% NaCl 100 mL/hr at 12/26/13 2000  . insulin (NOVOLIN-R) infusion Stopped (12/27/13 0600)    Time spent: 45 minutes with extensive teaching done regarding management of her diabetes and Addison's disease.   LOS: 1 day   [place]  [facility] Pager [phone]. If unable to reach me by pager, please call my cell phone at [phone].  *Please refer to [email or web address], password [email or web address] to get updated schedule on who will round on this patient, as hospitalists switch teams weekly. If 7PM-7AM, please contact night-coverage at [email or web address], password [email or web address] for any overnight needs.  12/27/2013, 7:18 AM    **Disclaimer: This note was dictated with voice recognition software. Similar sounding words can inadvertently be transcribed and this note may contain transcription errors which may not have been corrected upon publication of  note.**

## 2013-12-28 DIAGNOSIS — F3289 Other specified depressive episodes: Secondary | ICD-10-CM

## 2013-12-28 DIAGNOSIS — F329 Major depressive disorder, single episode, unspecified: Secondary | ICD-10-CM

## 2013-12-28 LAB — BASIC METABOLIC PANEL
BUN: 11 mg/dL (ref 6–23)
BUN: 12 mg/dL (ref 6–23)
CALCIUM: 7.8 mg/dL — AB (ref 8.4–10.5)
CHLORIDE: 103 meq/L (ref 96–112)
CO2: 20 mEq/L (ref 19–32)
CO2: 21 meq/L (ref 19–32)
CREATININE: 0.56 mg/dL (ref 0.50–1.10)
Calcium: 8.1 mg/dL — ABNORMAL LOW (ref 8.4–10.5)
Chloride: 104 mEq/L (ref 96–112)
Creatinine, Ser: 0.53 mg/dL (ref 0.50–1.10)
GFR calc Af Amer: >90 mL/min (ref >90)
GFR calc non Af Amer: >90 mL/min (ref >90)
GLUCOSE: 135 mg/dL — AB (ref 70–99)
GLUCOSE: 157 mg/dL — AB (ref 70–99)
Potassium: 3.2 mEq/L — ABNORMAL LOW (ref 3.7–5.3)
Potassium: 3.4 mEq/L — ABNORMAL LOW (ref 3.7–5.3)
SODIUM: 138 meq/L (ref 137–147)
Sodium: 137 mEq/L (ref 137–147)

## 2013-12-28 LAB — GLUCOSE, CAPILLARY
GLUCOSE-CAPILLARY: 134 mg/dL — AB (ref 70–99)
GLUCOSE-CAPILLARY: 162 mg/dL — AB (ref 70–99)
GLUCOSE-CAPILLARY: 242 mg/dL — AB (ref 70–99)
GLUCOSE-CAPILLARY: 424 mg/dL — AB (ref 70–99)
Glucose-Capillary: 147 mg/dL — ABNORMAL HIGH (ref 70–99)
Glucose-Capillary: 387 mg/dL — ABNORMAL HIGH (ref 70–99)
Glucose-Capillary: 331 mg/dL — ABNORMAL HIGH (ref 70–99)
Glucose-Capillary: 300 mg/dL — ABNORMAL HIGH (ref 70–99)

## 2013-12-28 LAB — GLUCOSE, RANDOM: Glucose, Bld: 428 mg/dL — ABNORMAL HIGH (ref 70–99)

## 2013-12-28 MED ORDER — INSULIN ASPART 100 UNIT/ML ~~LOC~~ SOLN
0.0000 [IU] | Freq: Every day | SUBCUTANEOUS | Status: DC
  Administered 2013-12-28: 3 [IU] via SUBCUTANEOUS

## 2013-12-28 MED ORDER — HYDROCORTISONE NA SUCCINATE PF 100 MG IJ SOLR
50.0000 mg | Freq: Two times a day (BID) | INTRAMUSCULAR | Status: DC
  Administered 2013-12-28 – 2013-12-29 (×2): 50 mg via INTRAVENOUS
  Filled 2013-12-28 (×4): qty 1

## 2013-12-28 MED ORDER — INSULIN ASPART 100 UNIT/ML ~~LOC~~ SOLN
3.0000 [IU] | Freq: Three times a day (TID) | SUBCUTANEOUS | Status: DC
  Administered 2013-12-28 (×3): 3 [IU] via SUBCUTANEOUS

## 2013-12-28 MED ORDER — INSULIN GLARGINE 100 UNIT/ML ~~LOC~~ SOLN
15.0000 [IU] | Freq: Every day | SUBCUTANEOUS | Status: DC
  Administered 2013-12-28 (×2): 15 [IU] via SUBCUTANEOUS
  Filled 2013-12-28 (×3): qty 0.15

## 2013-12-28 MED ORDER — PANTOPRAZOLE SODIUM 40 MG PO TBEC
40.0000 mg | DELAYED_RELEASE_TABLET | Freq: Every day | ORAL | Status: DC
  Administered 2013-12-28 – 2013-12-29 (×2): 40 mg via ORAL
  Filled 2013-12-28 (×2): qty 1

## 2013-12-28 MED ORDER — DEXTROSE 50 % IV SOLN: 50.0000 mL | Freq: Once | INTRAVENOUS | Status: DC | PRN

## 2013-12-28 MED ORDER — INSULIN ASPART 100 UNIT/ML ~~LOC~~ SOLN
0.0000 [IU] | Freq: Three times a day (TID) | SUBCUTANEOUS | Status: DC
  Administered 2013-12-29: 11 [IU] via SUBCUTANEOUS
  Administered 2013-12-29: 7 [IU] via SUBCUTANEOUS

## 2013-12-28 MED ORDER — INSULIN ASPART 100 UNIT/ML ~~LOC~~ SOLN
0.0000 [IU] | Freq: Three times a day (TID) | SUBCUTANEOUS | Status: DC
  Administered 2013-12-28: 9 [IU] via SUBCUTANEOUS
  Administered 2013-12-28: 3 [IU] via SUBCUTANEOUS
  Administered 2013-12-28: 9 [IU] via SUBCUTANEOUS

## 2013-12-28 MED ORDER — INSULIN ASPART 100 UNIT/ML ~~LOC~~ SOLN
6.0000 [IU] | Freq: Three times a day (TID) | SUBCUTANEOUS | Status: DC
  Administered 2013-12-29 (×2): 6 [IU] via SUBCUTANEOUS

## 2013-12-28 MED ORDER — DEXTROSE 50 % IV SOLN: 25.0000 mL | Freq: Once | INTRAVENOUS | Status: DC | PRN

## 2013-12-28 MED ORDER — POTASSIUM CHLORIDE CRYS ER 20 MEQ PO TBCR
40.0000 meq | EXTENDED_RELEASE_TABLET | Freq: Once | ORAL | Status: AC
  Administered 2013-12-28: 40 meq via ORAL
  Filled 2013-12-28: qty 2

## 2013-12-28 MED ORDER — GI COCKTAIL ~~LOC~~
30.0000 mL | Freq: Three times a day (TID) | ORAL | Status: DC | PRN
  Administered 2013-12-28 – 2013-12-29 (×2): 30 mL via ORAL
  Filled 2013-12-28 (×2): qty 30

## 2013-12-28 MED ORDER — POTASSIUM CHLORIDE CRYS ER 20 MEQ PO TBCR
30.0000 meq | EXTENDED_RELEASE_TABLET | Freq: Once | ORAL | Status: AC
  Administered 2013-12-28: 30 meq via ORAL
  Filled 2013-12-28: qty 1

## 2013-12-28 MED ORDER — GLUCOSE 40 % PO GEL: 1.0000 | ORAL | Status: DC | PRN

## 2013-12-28 NOTE — Progress Notes (Signed)
Inpatient Diabetes Program Recommendations  AACE/ADA: New Consensus Statement on Inpatient Glycemic Control (2013)  Target Ranges:  Prepandial:   less than 140 mg/dL      Peak postprandial:   less than 180 mg/dL (1-2 hours)      Critically ill patients:  140 - 180 mg/dL   Reason for Visit: follow-up from yesterday's assessment  Note:  Discussed with OP Diabetes Education f/u with patient yesterday.  She wasn't sure if she wanted to follow-up with the CDE in her endocrinologist's office, or with [facility].  Hoping to use Omni Pod for glycemic control.  Doesn't know if the [employer] offers training or not.  Agreeable to my placing an order with [facility].  Wants to know cost of her co-pay before she commits to an appointment.  Will include this request in the comments of referral.  Thank you.  Jolin Benavides S. Marcelline Mates, RN, CNS, CDE Inpatient Diabetes Program, team pager [phone]

## 2013-12-28 NOTE — Progress Notes (Signed)
This patient is receiving protonix. Based on criteria approved by the Pharmacy and Therapeutics Committee, this medication is being converted to the equivalent oral dose form. These criteria include:   . The patient is eating (either orally or per tube) and/or has been taking other orally administered medications for at least 24 hours.  . This patient has no evidence of active gastrointestinal bleeding or impaired GI absorption (gastrectomy, short bowel, patient on TNA or NPO).   If you have questions about this conversion, please contact the pharmacy department.  [place], [facility] 12/28/2013 10:11 AM

## 2013-12-28 NOTE — Progress Notes (Signed)
Pt 1700 blood sugar was 424. Lab collect ordered and resulted at 428. [place] notified and received orders to give max dose of novolog per scale (9units) in addition to the scheduled 3 units for a total of 12 units of novolog.

## 2013-12-28 NOTE — Progress Notes (Signed)
Patient ID: [name], female   DOB: [birth year], 33 y.o.   MRN: [id number]  [facility] PROGRESS NOTE  Jermisha Hoffart VXY:[id number] DOB: [birth year] DOA: 12/26/2013 PCP: Cari Caraway, MD  Brief Narrative:   33 y.o. female with a PMH of type 1 diabetes on Lantus/NovoLog, Addison's disease and hypothyroidism who was admitted 12/26/13 in DKA.   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  - Unclear trigger, pt did report missing several doses of Insulin as she was not feeling well  - although the patient complains of a sore throat. No signs of pneumonia on CXR. U/A negative for nitrites/leukocytes. UDS negative.  Active Problems:  Depression/Anxiety  - Actively involved in therapy, has an outpatient psychiatrist but is socially isolated with little local support.  - Patient's mother is concerned that the patient's psychiatric treatment isn't being optimally managed.  - Inpatient psychiatric consultation requested since the patient's lack of self care is potentially life threatening.  - Pt is clinically stable this AM - Continue Effexor, ? Alternative regimen to help regulate sleep patterns.  Addison disease  - Continue stress dose hydrocortisone and taper down form 50 mg TID to BID, continue fludrocortisone.  Restless leg  - Continue Mirapex.  Thyroid disease  - Continue Synthroid.  Diabetic neuropathy  - Continue Neurontin.  Hypersomnia  - Patient has been seen by sleep specialist who put her on Mirapex, Effexor, and Adderall. Hold Adderall.  Hyponatremia  - IVF provided and pt has responded well  - sodium is WNL this  AM Hypokalemia - will continue to supplement and will repeat BMP in AM DVT prophylaxis  - Lovenox   Consultants:  None  Procedures/Studies:  None  Antibiotics:  None   Code Status: Full Family Communication: Pt and mother at bedside Disposition Plan: Home when medically stable  HPI/Subjective: No events overnight.   Objective: Filed Vitals:   12/28/13 0500 12/28/13 0600 12/28/13 0800 12/28/13 1206  BP:    121/80  Pulse: 93 91  99  Temp:   97.5 F (36.4 C) 97.5 F (36.4 C)  TempSrc:   Oral Oral  Resp: 13 17  18   Height:    5\' 2"  (1.575 m)  Weight:    55.021 kg (121 lb 4.8 oz)  SpO2: 99% 99%  100%    Intake/Output Summary (Last 24 hours) at 12/28/13 1219 Last data filed at 12/28/13 1100  Gross per 24 hour  Intake 2730.63 ml  Output    750 ml  Net 1980.63 ml    Exam:   General:  Pt is alert, follows commands appropriately, not in acute distress  Cardiovascular: Regular rate and rhythm, S1/S2, no murmurs, no rubs, no gallops  Respiratory: Clear to auscultation bilaterally, no wheezing, no crackles, no rhonchi  Abdomen: Soft, non tender, non distended, bowel sounds present, no guarding  Extremities: No edema, pulses DP and PT palpable bilaterally  Neuro: Grossly nonfocal  Data Reviewed: Basic Metabolic Panel:  Recent Labs Lab 12/27/13 1152 12/27/13 1600 12/27/13 2018 12/28/13 12/28/13 0356  NA 131* 133* 134* 137 138  K 4.3 3.6* 3.4* 3.4* 3.2*  CL 95* 98 100 103 104  CO2 14* 19 19 21 20   GLUCOSE 321* 227* 156* 157* 135*  BUN 12 12 11 12 11   CREATININE 0.61 0.58 0.62 0.56 0.53  CALCIUM 7.8* 7.8* 8.1* 8.1* 7.8*   Liver Function Tests:  Recent Labs Lab 12/26/13 1145  AST 30  ALT 23  ALKPHOS 137*  BILITOT <0.2*  PROT 7.8  ALBUMIN 4.1    Recent Labs Lab 12/26/13 1145  LIPASE 12   CBC:  Recent Labs Lab 12/26/13 1145 12/26/13 1344 12/27/13 0424  WBC 17.1*  --  9.1  NEUTROABS 12.1*  --   --   HGB 18.0* 17.0* 13.0  HCT 49.4*  50.0* 35.0*  MCV 88.5  --  83.5  PLT 286  --  262   CBG:  Recent Labs Lab 12/27/13 2126 12/27/13 2230 12/27/13 2327 12/28/13 0033 12/28/13 0133  GLUCAP 133* 148* 147* 134* 162*    Recent Results (from the past 240 hour(s))  MRSA PCR SCREENING     Status: None   Collection Time    12/26/13  2:47 PM      Result Value Ref Range Status   MRSA by PCR NEGATIVE  NEGATIVE Final   Comment:            The GeneXpert MRSA Assay (FDA     approved for NASAL specimens     only), is one component of a     comprehensive MRSA colonization     surveillance program. It is not     intended to diagnose MRSA     infection nor to guide or     monitor treatment for     MRSA infections.  RAPID STREP SCREEN     Status: None   Collection Time    12/27/13 11:00 AM      Result Value Ref Range Status   Streptococcus, Group A Screen (Direct) NEGATIVE  NEGATIVE Final   Comment: (NOTE)     A Rapid Antigen test may result negative if the antigen level in the     sample is below the detection level of this test. The FDA has not     cleared this test as a stand-alone test therefore the rapid antigen     negative result has reflexed to a Group A Strep culture.  CULTURE, GROUP A STREP     Status: None   Collection Time    12/27/13 11:00 AM      Result Value Ref Range Status   Specimen Description THROAT   Final   Special Requests NONE   Final   Culture     Final   Value: NO SUSPICIOUS COLONIES, CONTINUING TO HOLD     Performed at [facility]   Report Status PENDING   Incomplete     Scheduled Meds: . enoxaparin injection  40 mg Subcutaneous Q24H  . fludrocortisone  0.05 mg Oral Daily  . gabapentin  100-300 mg Oral QHS  . SOLU-CORTEF inj  50 mg Intravenous Q8H  . insulin aspart  0-9 Units Subcutaneous TID WC  . insulin aspart  3 Units Subcutaneous TID WC  . insulin glargine  15 Units Subcutaneous QHS  . levothyroxine  37.5 mcg Oral QAC breakfast  . loratadine  10 mg Oral Daily  .  norethindrone  1 tablet Oral Daily  . ondansetron  IV  4 mg Intravenous 4 times per day  . pantoprazole  40 mg Oral Daily  . rOPINIRole  0.25 mg Oral QHS  . venlafaxine XR  150 mg Oral Q breakfast   Continuous Infusions: . sodium chloride 100 mL/hr at 12/27/13 1330   Theodis Blaze, MD  [facility] Pager [phone]  If 7PM-7AM, please contact night-coverage [email or web address] Password [email or web address] 12/28/2013, 12:19 PM   LOS: 2 days

## 2013-12-28 NOTE — Progress Notes (Signed)
Clinical Social Work Department CLINICAL SOCIAL WORK PSYCHIATRY SERVICE LINE ASSESSMENT 12/28/2013  Patient:  [name]  Account:  [id number]  Admit Date:  12/26/2013  Clinical Social Worker:  Sindy Messing, LCSW  Date/Time:  12/28/2013 01:00 PM Referred by:  Physician  Date referred:  12/28/2013 Reason for Referral  Psychosocial assessment   Presenting Symptoms/Problems (In the person's/family's own words):   Psych consulted due to anxiety   Abuse/Neglect/Trauma History (check all that apply)  Denies history   Abuse/Neglect/Trauma Comments:   Psychiatric History (check all that apply)  Outpatient treatment   Psychiatric medications:  Effexor 150 mg   Current Mental Health Hospitalizations/Previous Mental Health History:   Patient reports she has been diagnosed with anxiety in the past. Patient states that she receives medication management 3-4 times a year and goes to weekly individual counseling.   Current provider:   Dr. Arfeen-psychiatrist  Pamala Hurry Bear-therapist   Place and Date:   [place], [place]   Current Medications:   Scheduled Meds:      . enoxaparin (LOVENOX) injection  40 mg Subcutaneous Q24H  . fludrocortisone  0.05 mg Oral Daily  . gabapentin  100-300 mg Oral QHS  . hydrocortisone sod succinate (SOLU-CORTEF) inj  50 mg Intravenous Q12H  . insulin aspart  0-9 Units Subcutaneous TID WC  . insulin aspart  3 Units Subcutaneous TID WC  . insulin glargine  15 Units Subcutaneous QHS  . levothyroxine  37.5 mcg Oral QAC breakfast  . loratadine  10 mg Oral Daily  . norethindrone  1 tablet Oral Daily  . ondansetron (ZOFRAN) IV  4 mg Intravenous 4 times per day  . pantoprazole  40 mg Oral Daily  . rOPINIRole  0.25 mg Oral QHS  . venlafaxine XR  150 mg Oral Q breakfast        Continuous Infusions:      . sodium chloride 100 mL/hr at 12/27/13 1330          PRN Meds:.dextrose, gi cocktail       Previous Impatient Admission/Date/Reason:   None reported    Emotional Health / Current Symptoms    Suicide/Self Harm  None reported   Suicide attempt in the past:   Patient denies any SI or HI. Patient denies any previous attempts.   Other harmful behavior:   None reported   Psychotic/Dissociative Symptoms  None reported   Other Psychotic/Dissociative Symptoms:    Attention/Behavioral Symptoms  Within Normal Limits   Other Attention / Behavioral Symptoms:   Patient engaged during assessment.    Cognitive Impairment  Within Normal Limits   Other Cognitive Impairment:   Patient alert and oriented.    Mood and Adjustment  Mood Congruent    Stress, Anxiety, Trauma, Any Recent Loss/Stressor  Anxiety  Relationship   Anxiety (frequency):   Patient reports that she has been managing anxiety for several years. Patient feels with medication management and therapy that symptoms are being managed well.   Phobia (specify):   N/A   Compulsive behavior (specify):   N/A   Obsessive behavior (specify):   N/A   Other:   Patient reports poor social support and wishes she had more friends.   Substance Abuse/Use  None   SBIRT completed (please refer for detailed history):  N  Self-reported substance use:   Patient denies all substance use and declines to complete SBIRT.   Urinary Drug Screen Completed:  Y Alcohol level:   Negative screen    Environmental/Housing/Living Arrangement  Stable housing  Who is in the home:   Alone   Emergency contact:  [place]   Patient's Strengths and Goals (patient's own words):   Patient reports that parents are supportive. Patient is compliant with MH treatment.   Clinical Social Worker's Interpretive Summary:   CSW received referral in order to complete psychosocial assessment. CSW reviewed chart and met with patient at bedside. Patient alone at bedside but mom joined assessment and patient agreeable for mom to be involved. CSW introduced myself  and explained role.    Patient currently lives alone and reports that due to medical and [place] concerns she is unable to work a full time job. Patient's parents are supportive and provide emotional and financial assistance. Patient reports that she has several outpatient appointments for medical concerns and is already connected with psychiatrist for medication management for anxiety.    Patient reports that she was diagnosed with anxiety several years ago. Patient reports compliance with medications and appointments and states she recently started seeing a therapist for individual therapy. Patient feels that individual therapy is helpful but continues to feel lonely and depressed due to low social support. Patient reports limited friends and she has not had the opportunity to meet others. Patient currently attends diabetes support group and is in a group at church but feels she needs additional support. Patient asked CSW about group therapy referrals.    Patient engaged during assessment and spoke about relationships with parents and friends. Patient feels that she has increased pressure to manage anxiety due to medical concerns. Patient denies any SI or HI or psychotic features. Patient interested in further treatment and thanked CSW for assistance.    CSW will continue to follow.   Disposition:  Recommend Psych CSW continuing to support while in hospital   [place], [place] [phone]

## 2013-12-29 LAB — CBC
HCT: 33.7 % — ABNORMAL LOW (ref 36.0–46.0)
Hemoglobin: 12.4 g/dL (ref 12.0–15.0)
MCH: 31.5 pg (ref 26.0–34.0)
MCHC: 36.8 g/dL — ABNORMAL HIGH (ref 30.0–36.0)
MCV: 85.5 fL (ref 78.0–100.0)
Platelets: 232 10*3/uL (ref 150–400)
RBC: 3.94 MIL/uL (ref 3.87–5.11)
RDW: 12.9 % (ref 11.5–15.5)
WBC: 5.5 10*3/uL (ref 4.0–10.5)

## 2013-12-29 LAB — BASIC METABOLIC PANEL
BUN: 18 mg/dL (ref 6–23)
CALCIUM: 8.5 mg/dL (ref 8.4–10.5)
CO2: 22 meq/L (ref 19–32)
Chloride: 103 mEq/L (ref 96–112)
Creatinine, Ser: 0.61 mg/dL (ref 0.50–1.10)
GFR calc non Af Amer: >90 mL/min (ref >90)
Glucose, Bld: 258 mg/dL — ABNORMAL HIGH (ref 70–99)
Potassium: 3.9 mEq/L (ref 3.7–5.3)
Sodium: 138 mEq/L (ref 137–147)

## 2013-12-29 LAB — CULTURE, GROUP A STREP

## 2013-12-29 LAB — GLUCOSE, CAPILLARY
Glucose-Capillary: 281 mg/dL — ABNORMAL HIGH (ref 70–99)
Glucose-Capillary: 207 mg/dL — ABNORMAL HIGH (ref 70–99)

## 2013-12-29 MED ORDER — INSULIN GLARGINE 100 UNIT/ML ~~LOC~~ SOLN
18.0000 [IU] | Freq: Every day | SUBCUTANEOUS | Status: DC
Start: 2013-12-29 — End: 2014-02-15

## 2013-12-29 MED ORDER — ONDANSETRON HCL 4 MG PO TABS: 4.0000 mg | ORAL_TABLET | Freq: Three times a day (TID) | ORAL | Status: DC | PRN

## 2013-12-29 NOTE — Discharge Instructions (Signed)
Diabetic Ketoacidosis °Diabetic ketoacidosis (DKA) is a life-threatening complication of type 1 diabetes. It must be quickly recognized and treated. Treatment requires hospitalization. °CAUSES  °When there is no insulin in the body, glucose (sugar) cannot be used and the body breaks down fat for energy. When fat breaks down, acids (ketones) build up in the blood. Very high levels of glucose and high levels of acids lead to severe loss of body fluids (dehydration) and other dangerous chemical changes. This stresses your vital organs and can cause coma or death. °SYMPTOMS  °· Tiredness (fatigue). °· Weight loss. °· Excessive thirst. °· Ketones in the urine. °· Lightheadedness. °· Fruity or sweet smell on your breath. °· Excessive urination. °· Visual changes. °· Confusion or irritability. °· Feeling sick to your stomach (nauseous) or vomiting. °· Rapid breathing. °· Stomachache or belly (abdominal) pain. °DIAGNOSIS  °Your caregiver will diagnose DKA based on your history, physical exam, and blood tests. Your caregiver will check if there is another illness present which caused you to go into DKA. Most of this will be done quickly in an emergency room. °TREATMENT  °· Fluid replacement to correct dehydration. °· Insulin. °· Correction of electrolytes, such as potassium and sodium. °· Medicines (antibiotics) that kill germs for infections. °PREVENTION °· Always take your insulin. Do not skip your insulin injections. °· If you are ill, treat yourself quickly. Your body often needs more insulin to fight the illness. °· Check your blood glucose regularly. °· Check urine ketones if your blood glucose is greater than 240 milligrams per deciliter (mg/dl). °· Do not used expired or outdated insulin. °· If your blood glucose is high, drink plenty of fluids. This helps flush out ketones. °HOME CARE INSTRUCTIONS  °· If you are ill, follow the advice of your caregiver. °· To prevent loss of body fluids (dehydration), drink enough  water and fluids to keep your urine clear or pale yellow. °· If you cannot eat, alternate between drinking fluids with sugar (soda, juices, flavored gelatin) and salty fluids (broth, bouillon). °· If you can eat, follow your usual diet and drink sugar-free liquids (water, diet drinks). °· Always take your usual dose of insulin. If you cannot eat, or your glucose is getting too low, call your caregiver for further instructions. °· Continue to monitor your blood or urine ketones every 3 to 4 hours around the clock. Set your alarm clock or have someone wake you up. If you are too sick, have someone test it for you. °· Rest and avoid exercise. °SEEK MEDICAL CARE IF:  °· You have ketones in your urine or your blood glucose is higher than a level your caregiver suggests. You may need extra insulin. Call your caregiver if you need advice on adjusting your insulin. °· You cannot drink at least a tablespoon of fluid every 15 to 20 minutes. °· You have been throwing up for more than 2 hours. °· You have symptoms of DKA: °· Fruity smelling breath. °· Breathing faster or slower. °· Becoming very sleepy. °SEEK IMMEDIATE MEDICAL CARE IF:  °· You have signs of dehydration: °· Decreased urination. °· Increased thirst. °· Dry skin and mouth. °· Lightheadedness. °· Your blood glucose is very high (as advised by your caregiver) twice in a row. °· You or your child has an oral temperature above 102° F (38.9° C), not controlled by medicine. °· You pass out. °· You have chest pain and/or trouble breathing. °· You have a sudden, severe headache. °· You have sudden   weakness in one arm and/or one leg. °· You have sudden difficulty speaking and/or swallowing. °· You develop vomiting and/or diarrhea that is getting worse after 3 to 4 hours. °· You have abdominal pain. °MAKE SURE YOU:  °· Understand these instructions. °· Will watch your condition. °· Will get help right away if you are not doing well or get worse. °Document Released:  07/19/2000 Document Revised: 10/14/2011 Document Reviewed: 01/25/2009 °Exit[employer]® Patient Information ©2014 [employer]. ° °

## 2013-12-29 NOTE — Progress Notes (Signed)
Patient discharge home with mother, alert and oriented, discharge instructions given, patient verbalize understanding of discharge instructions given, My Chart access declined at this time, will access later from home, patient in stable condition at this time

## 2013-12-29 NOTE — Care Management Note (Unsigned)
    Page 1 of 1   12/29/2013     12:32:57 PM CARE MANAGEMENT NOTE 12/29/2013  Patient:  [name],[name]   Account Number:  [id number]  Date Initiated:  12/27/2013  Documentation initiated by:  DAVIS,RHONDA  Subjective/Objective Assessment:   dka     Action/Plan:   home when stable , will follow for possible needs.   Anticipated DC Date:  12/30/2013   Anticipated DC Plan:  HOME/SELF CARE         Choice offered to / List presented to:             Status of service:  In process, will continue to follow Medicare Important Message given?  NO (If response is "NO", the following Medicare IM given date fields will be blank) Date Medicare IM given:   Date Additional Medicare IM given:    Discharge Disposition:    Per UR Regulation:  Reviewed for med. necessity/level of care/duration of stay  If discussed at [facility] of Stay Meetings, dates discussed:    Comments:  12/29/13 Allene Dillon RN BSN [phone] Met with pt at bedside to discuss d/c needs. Pt's mother present and pt ok with her presence durung the discussion. Pt asked about [facility] services and if she would qualify for insurance to pay for the services. Discussed what exactly she feels she needs at this time and it came down to she needs someone to check on her esepcially because she diabetic. I explained to her that she has to have a skilled need in order for her insurance to pay for the servcies. At this time she does not have that.  I offered her the list of private caregivers and explained to her she has to make the arrangement herself as it is not covered by her insurance. She understood and was appreciative.

## 2013-12-29 NOTE — Discharge Summary (Signed)
Physician Discharge Summary  [name] WER:[id number] DOB: 1980-11-21 DOA: 12/26/2013  PCP: Cari Caraway, MD  Admit date: 12/26/2013 Discharge date: 12/29/2013  Recommendations for Outpatient Follow-up:  1. Pt will need to follow up with PCP in 2-3 weeks post discharge 2. Please obtain BMP to evaluate electrolytes and kidney function 3. Please also check CBC to evaluate Hg and Hct levels 4. 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 female with a PMH of type 1 diabetes on Lantus/NovoLog, Addison's disease and hypothyroidism who was admitted 12/26/13 in DKA.   Assessment/Plan:   Principal Problem:  DKA, type 1 / type 1 diabetes mellitus with neurological complications  - Status post IV fluid bolus in the ER consisting of normal saline x3 liters, continue hydration and encourage PO intake  - AG closed and pt has been off insulin drip for 24 hours  - Transitioned to sub Q insulin, Lantus 15 U QHS and SSI successfully  - Given a total of 150 mEq of sodium bicarbonate for severe acidosis.  - Unclear trigger, pt did report missing several doses of Insulin as she was not feeling well  - although the patient complains of a sore throat. No signs of pneumonia on CXR. U/A negative for nitrites/leukocytes. UDS negative.  Active Problems:  Depression/Anxiety  - Actively involved in therapy, has an outpatient psychiatrist but is socially isolated with little local support.  - Patient's mother is concerned that the patient's psychiatric treatment isn't being  optimally managed.  - Inpatient psychiatric consultation requested since the patient's lack of self care is potentially life threatening.  - Pt is clinically stable this AM  - Continue Effexor Addison disease  - Continue home dose steroids  Restless leg  - Continue Mirapex.  Thyroid disease  - Continue Synthroid.  Diabetic neuropathy  - Continue Neurontin.  Hypersomnia  - Patient has been seen by sleep specialist who put her on Mirapex, Effexor, and Adderall. Hold Adderall.  Hyponatremia  - IVF provided and pt has responded well  - sodium is WNL this AM  Hypokalemia  - supplemented and WNL this AM DVT prophylaxis  - Lovenox provided while inpatient   Consultants:  Psych  Procedures/Studies:  None  Antibiotics:  None   Code Status: Full  Family Communication: Pt and mother at bedside   Discharge Exam: Filed Vitals:   12/29/13 1000  BP: 137/88  Pulse: 77  Temp: 97.4 F (36.3 C)  Resp: 16   Filed Vitals:   12/28/13 1858 12/28/13 2200 12/29/13 0600 12/29/13 1000  BP: 136/87  128/76 137/88  Pulse: 85 92 93 77  Temp: 97.8 F (36.6 C) 98.5 F (36.9 C) 98.4 F (36.9 C) 97.4 F (36.3 C)  TempSrc: Oral Oral Oral Oral  Resp: 16 18 18 16   Height:      Weight:      SpO2: 100% 100% 100% 100%    General: Pt is alert, follows commands appropriately, not in acute distress Cardiovascular: Regular rate and rhythm, S1/S2 +, no murmurs, no rubs, no gallops Respiratory: Clear to  auscultation bilaterally, no wheezing, no crackles, no rhonchi Abdominal: Soft, non tender, non distended, bowel sounds +, no guarding Extremities: no edema, no cyanosis, pulses palpable bilaterally DP and PT Neuro: Grossly nonfocal  Discharge Instructions  Discharge Instructions   Ambulatory referral to Nutrition and Diabetic Education    Complete by:  As directed   Patient admitted with DKA.  Considering use of an Omni Pod insulin pump.  Has been on insulin pump therapy in the past.  Wants to  know what her co-payment will be before committing to an appointment at [facility].  Was last seen at [facility] about one year ago.  Phone # [phone].  Thank you.     Diet - low sodium heart healthy    Complete by:  As directed      Increase activity slowly    Complete by:  As directed             Medication List         amphetamine-dextroamphetamine 10 MG tablet  Commonly known as:  ADDERALL  Take 10 mg by mouth 2 (two) times daily.     fludrocortisone 0.1 MG tablet  Commonly known as:  FLORINEF  Take 0.05 mg by mouth daily.     gabapentin 100 MG capsule  Commonly known as:  NEURONTIN  Take 100-400 mg by mouth at bedtime. As needed for foot pain     hydrocortisone 10 MG tablet  Commonly known as:  CORTEF  Take 10 mg by mouth daily.     insulin glargine 100 UNIT/ML injection  Commonly known as:  LANTUS  Inject 0.18 mLs (18 Units total) into the skin at bedtime.     insulin lispro 100 UNIT/ML injection  Commonly known as:  HUMALOG  Inject 1-10 Units into the skin 3 (three) times daily before meals. Per sliding scale     levothyroxine 50 MCG tablet  Commonly known as:  SYNTHROID, LEVOTHROID  Take 50 mcg by mouth daily before breakfast.     Melatonin 1 MG Tabs  Take 1 mg by mouth at bedtime.     multivitamin with minerals Tabs tablet  Take 1 tablet by mouth daily.     norethindrone 0.35 MG tablet  Commonly known as:  MICRONOR,CAMILA,ERRIN  Take 1 tablet by mouth daily.     ondansetron 4 MG tablet  Commonly known as:  ZOFRAN  Take 1 tablet (4 mg total) by mouth every 8 (eight) hours as needed for nausea or vomiting.     ramipril 1.25 MG capsule  Commonly known as:  ALTACE  Take 1.25 mg by mouth daily.     rOPINIRole 0.5 MG tablet  Commonly known as:  REQUIP  Take 0.5-1 mg by mouth at bedtime.     venlafaxine XR 150 MG 24 hr capsule  Commonly known as:  EFFEXOR-XR  Take 150 mg by mouth daily with breakfast.           Follow-up Information   Schedule an  appointment as soon as possible for a visit with MCNEILL,WENDY, MD.   Specialty:  Family Medicine   Contact information:   [place] [address] [phone]       Follow up with Faye Ramsay, MD. (As needed, If symptoms worsen, call my cell phone [phone])    Specialty:  Internal Medicine   Contact information:   [address] [place] [address] [phone]        The results of significant diagnostics  from this hospitalization (including imaging, microbiology, ancillary and laboratory) are listed below for reference.     Microbiology: Recent Results (from the past 240 hour(s))  MRSA PCR SCREENING     Status: None   Collection Time    12/26/13  2:47 PM      Result Value Ref Range Status   MRSA by PCR NEGATIVE  NEGATIVE Final   Comment:            The GeneXpert MRSA Assay (FDA     approved for NASAL specimens     only), is one component of a     comprehensive MRSA colonization     surveillance program. It is not     intended to diagnose MRSA     infection nor to guide or     monitor treatment for     MRSA infections.  RAPID STREP SCREEN     Status: None   Collection Time    12/27/13 11:00 AM      Result Value Ref Range Status   Streptococcus, Group A Screen (Direct) NEGATIVE  NEGATIVE Final   Comment: (NOTE)     A Rapid Antigen test may result negative if the antigen level in the     sample is below the detection level of this test. The FDA has not     cleared this test as a stand-alone test therefore the rapid antigen     negative result has reflexed to a Group A Strep culture.  CULTURE, GROUP A STREP     Status: None   Collection Time    12/27/13 11:00 AM      Result Value Ref Range Status   Specimen Description THROAT   Final   Special Requests NONE   Final   Culture     Final   Value: No Beta Hemolytic Streptococci Isolated     Performed at [facility]   Report Status 12/29/2013 FINAL   Final      Labs: Basic Metabolic Panel:  Recent Labs Lab 12/27/13 1600 12/27/13 2018 12/28/13 12/28/13 0356 12/28/13 1726 12/29/13 0455  NA 133* 134* 137 138  --  138  K 3.6* 3.4* 3.4* 3.2*  --  3.9  CL 98 100 103 104  --  103  CO2 19 19 21 20   --  22  GLUCOSE 227* 156* 157* 135* 428* 258*  BUN 12 11 12 11   --  18  CREATININE 0.58 0.62 0.56 0.53  --  0.61  CALCIUM 7.8* 8.1* 8.1* 7.8*  --  8.5   Liver Function Tests:  Recent Labs Lab 12/26/13 1145  AST 30  ALT 23  ALKPHOS 137*  BILITOT <0.2*  PROT 7.8  ALBUMIN 4.1    Recent Labs Lab 12/26/13 1145  LIPASE 12   No results found for this basename: AMMONIA,  in the last 168 hours CBC:  Recent Labs Lab 12/26/13 1145 12/26/13 1344 12/27/13 0424 12/29/13 0455  WBC 17.1*  --  9.1 5.5  NEUTROABS 12.1*  --   --   --   HGB 18.0* 17.0* 13.0 12.4  HCT 49.4* 50.0* 35.0* 33.7*  MCV 88.5  --  83.5 85.5  PLT 286  --  262 232   Cardiac Enzymes: No results found for this basename: CKTOTAL, CKMB, CKMBINDEX, TROPONINI,  in the last 168 hours BNP: BNP (last 3 results) No results found for this basename: PROBNP,  in the last 8760 hours CBG:  Recent Labs Lab 12/28/13  1653 12/28/13 2007 12/28/13 2112 12/29/13 0741 12/29/13 1148  GLUCAP 424* 331* 300* 281* 207*     SIGNED: Time coordinating discharge: Over 30 minutes  Theodis Blaze, MD  [facility] 12/29/2013, 12:44 PM Pager [phone]  If 7PM-7AM, please contact night-coverage [email or web address] Password [email or web address]

## 2013-12-29 NOTE — Progress Notes (Signed)
Clinical Social Work  CSW met with patient and mom at bedside. Patient reports she slept well and is feeling better and hopeful to DC today. CSW brought outpatient resources to review with patient for additional support in the community when she DC. Patient very receptive to review information. CSW provided information for [facility] intensive outpatient program (IOP) and support groups for anxiety. Patient reports she is most interested in IOP and feels she needs additional support. CSW explained that CSW has left a message to inquire if insurance is accepted but encouraged patient to follow up as well. Patient thanked CSW for information and reports no needs at this time. Patient has CSW contact information. CSW will continue to follow to support during hospitalization.  [place], [place] [phone]

## 2013-12-30 NOTE — ED Provider Notes (Signed)
Medical screening examination/treatment/procedure(s) were performed by non-physician practitioner and as supervising physician I was immediately available for consultation/collaboration.   EKG Interpretation   Date/Time:  Sunday Dec 26 2013 11:38:35 EDT Ventricular Rate:  101 PR Interval:  128 QRS Duration: 90 QT Interval:  390 QTC Calculation: 505 R Axis:   48 Text Interpretation:  Sinus tachycardia Right atrial enlargement Prolonged  QT interval Since previous tracing QT interval is longer Confirmed by  Canary Brim  MD, Raiden Haydu [phone]) on 12/26/2013 2:33:29 PM       Threasa Beards, MD 12/30/13 [phone]

## 2014-01-12 ENCOUNTER — Other Ambulatory Visit (HOSPITAL_COMMUNITY): Payer: Self-pay | Admitting: Psychiatry

## 2014-01-13 ENCOUNTER — Telehealth (HOSPITAL_COMMUNITY): Payer: Self-pay

## 2014-01-13 ENCOUNTER — Other Ambulatory Visit (HOSPITAL_COMMUNITY): Payer: Self-pay | Admitting: Psychiatry

## 2014-01-20 ENCOUNTER — Other Ambulatory Visit (HOSPITAL_COMMUNITY): Payer: Self-pay | Admitting: *Deleted

## 2014-01-20 DIAGNOSIS — F3289 Other specified depressive episodes: Secondary | ICD-10-CM

## 2014-01-20 DIAGNOSIS — F329 Major depressive disorder, single episode, unspecified: Secondary | ICD-10-CM

## 2014-01-20 MED ORDER — VENLAFAXINE HCL ER 150 MG PO CP24: 150.0000 mg | ORAL_CAPSULE | Freq: Every day | ORAL | Status: DC

## 2014-01-25 ENCOUNTER — Encounter (HOSPITAL_COMMUNITY): Payer: Self-pay | Admitting: Psychiatry

## 2014-01-25 ENCOUNTER — Ambulatory Visit (HOSPITAL_COMMUNITY): Payer: Self-pay | Admitting: Psychiatry

## 2014-01-26 ENCOUNTER — Institutional Professional Consult (permissible substitution): Payer: BC Managed Care – PPO | Admitting: Pulmonary Disease

## 2014-02-15 ENCOUNTER — Encounter (HOSPITAL_COMMUNITY): Payer: Self-pay | Admitting: Psychiatry

## 2014-02-15 ENCOUNTER — Ambulatory Visit (INDEPENDENT_AMBULATORY_CARE_PROVIDER_SITE_OTHER): Payer: BC Managed Care – PPO | Admitting: Psychiatry

## 2014-02-15 VITALS — BP 111/87 | HR 98 | Ht 62.0 in | Wt 117.6 lb

## 2014-02-15 DIAGNOSIS — F329 Major depressive disorder, single episode, unspecified: Secondary | ICD-10-CM

## 2014-02-15 DIAGNOSIS — F3289 Other specified depressive episodes: Secondary | ICD-10-CM

## 2014-02-15 MED ORDER — VENLAFAXINE HCL ER 150 MG PO CP24: 150.0000 mg | ORAL_CAPSULE | Freq: Every day | ORAL | Status: DC

## 2014-02-15 NOTE — Progress Notes (Signed)
[facility] [phone] Progress Note  [name] [id number] 33 y.o.  02/15/2014 11:45 AM  Chief Complaint: Medication management and followup.    History of Present Illness: [name] came for her appointment.  She was hospitalized in May on a medical floor because of hypoglycemia and anxiety symptoms.  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  At that time she was prescribed Adderall by her primary care physician and after that she felt very anxious nervous.  She was having tremors and she stopped taking insulin because she was not sure if she was hypoglycemic.  She was recommended to stop the Adderall.  She is feeling better.  She continues to have sleep issues.  She is sleeping too much and she scheduled to see a pulmonologist in August.  She will discuss with her pulmonologist about her sleep pattern and also she is hoping to get modafinil which helped her in the past.  She is complaining of fatigued and decreased energy.  She continues to work as a part-time with [employer] but she admitted lately not as intense .  She still looking for a full-time job.  Patient denies any hallucination, paranoid, active or passive suicidal thoughts and homicidal thoughts.  She mentioned that her sugar has been unstable and last month she started taking insulin pump.  She is hoping that her hemoglobin A1c will be improved but she see her endocrinologist in mid August.  Patient continues to have physical symptoms of anxiety but denies any major panic attack.  She feels some time lack of motivation but denies any feeling of hopelessness or worthlessness.  She lives by herself.  Her appetite is okay.  She is keeping her diet under control.  She is concerned about her diabetes.  Her vitals are stable.  Suicidal Ideation: No Plan Formed: No Patient has means to carry out plan: No  Homicidal Ideation: No Plan Formed: No Patient has means to carry out plan: No  Review of  Systems: Psychiatric: Agitation: No Hallucination: No Depressed Mood: Yes Insomnia: No Hypersomnia: No Altered Concentration: No Feels Worthless: Yes Grandiose Ideas: No Belief In Special Powers: No New/Increased Substance Abuse: No Compulsions: No  Neurologic: Headache: No Seizure: No Paresthesias: No  Medical History:  Patient has insulin dependent diabetes, Addison disease and restless leg syndrome.  She sees Dr. Posey Pronto at [facility] for the management of diabetes.  Outpatient Encounter Prescriptions as of 02/15/2014  Medication Sig  . fludrocortisone (FLORINEF) 0.1 MG tablet Take 0.05 mg by mouth daily.  Marland Kitchen gabapentin (NEURONTIN) 100 MG capsule Take 100-400 mg by mouth at bedtime. As needed for foot pain  . hydrocortisone (CORTEF) 10 MG tablet Take 10 mg by mouth daily.  . insulin lispro (HUMALOG) 100 UNIT/ML injection Inject 1-10 Units into the skin 3 (three) times daily before meals. Per sliding scale  . levothyroxine (SYNTHROID, LEVOTHROID) 50 MCG tablet Take 50 mcg by mouth daily before breakfast.  . Melatonin 1 MG TABS Take 1 mg by mouth at bedtime.  . Multiple Vitamin (MULTIVITAMIN WITH MINERALS) TABS tablet Take 1 tablet by mouth daily.  . norethindrone (MICRONOR,CAMILA,ERRIN) 0.35 MG tablet Take 1 tablet by mouth daily.  . ondansetron (ZOFRAN) 4 MG tablet Take 1 tablet (4 mg total) by mouth every 8 (eight) hours as needed for nausea or vomiting.  . ramipril (ALTACE) 1.25 MG capsule Take 1.25 mg by mouth daily.  Marland Kitchen rOPINIRole (REQUIP) 0.5 MG tablet Take 0.5-1 mg by mouth at bedtime.  Marland Kitchen  venlafaxine XR (EFFEXOR-XR) 150 MG 24 hr capsule Take 1 capsule (150 mg total) by mouth daily with breakfast.  . [DISCONTINUED] venlafaxine XR (EFFEXOR-XR) 150 MG 24 hr capsule Take 1 capsule (150 mg total) by mouth daily with breakfast.  . [DISCONTINUED] amphetamine-dextroamphetamine (ADDERALL) 10 MG tablet Take 10 mg by mouth 2 (two) times daily.   . [DISCONTINUED] insulin glargine  (LANTUS) 100 UNIT/ML injection Inject 0.18 mLs (18 Units total) into the skin at bedtime.    Past Psychiatric History/Hospitalization(s): Patient has tried Zoloft, Wellbutrin and Celexa.  She felt the Zoloft stopped working , Wellbutrin caused tenderness and Celexa did not help. Anxiety: Yes Bipolar Disorder: No Depression: Yes Mania: No Psychosis: No Schizophrenia: No Personality Disorder: No Hospitalization for psychiatric illness: No History of Electroconvulsive Shock Therapy: No Prior Suicide Attempts: No  Physical Exam: Constitutional:  BP 111/87  Pulse 98  Ht _0  (1.575 m)  Wt 117 lb 9.6 oz (53.343 kg)  BMI 21.50 kg/m2  Recent Results (from the past 2160 hour(s))  CBG MONITORING, ED     Status: Abnormal   Collection Time    12/26/13 11:25 AM      Result Value Ref Range   Glucose-Capillary 452 (*) 70 - 99 mg/dL   Comment 1 Notify RN    BLOOD GAS, VENOUS     Status: Abnormal   Collection Time    12/26/13 11:42 AM      Result Value Ref Range   pH, Ven 7.012 (*) 7.250 - 7.300   Comment: CRITICAL RESULT CALLED TO, READ BACK BY AND VERIFIED WITH:     DR. QZRAQTM AT 1153 BY T.BURGESS,RRT,RCP ON 12/26/2013   pCO2, Ven 23.1 (*) 45.0 - 50.0 mmHg   pO2, Ven 43.4  30.0 - 45.0 mmHg   Bicarbonate 5.6 (*) 20.0 - 24.0 mEq/L   TCO2 5.3  0 - 100 mmol/L   Acid-base deficit 27.4 (*) 0.0 - 2.0 mmol/L   O2 Saturation 70.3     Patient temperature 98.6     Collection site VEIN     Drawn by COLLECTED BY LABORATORY     Sample type VENOUS    CBC WITH DIFFERENTIAL     Status: Abnormal   Collection Time    12/26/13 11:45 AM      Result Value Ref Range   WBC 17.1 (*) 4.0 - 10.5 K/uL   RBC 5.58 (*) 3.87 - 5.11 MIL/uL   Hemoglobin 18.0 (*) 12.0 - 15.0 g/dL   HCT 49.4 (*) 36.0 - 46.0 %   MCV 88.5  78.0 - 100.0 fL   MCH 32.3  26.0 - 34.0 pg   MCHC 36.4 (*) 30.0 - 36.0 g/dL   RDW 12.1  11.5 - 15.5 %   Platelets 286  150 - 400 K/uL   Neutrophils Relative % 71  43 - 77 %   Lymphocytes  Relative 19  12 - 46 %   Monocytes Relative 10  3 - 12 %   Eosinophils Relative 0  0 - 5 %   Basophils Relative 0  0 - 1 %   Neutro Abs 12.1 (*) 1.7 - 7.7 K/uL   Lymphs Abs 3.3  0.7 - 4.0 K/uL   Monocytes Absolute 1.7 (*) 0.1 - 1.0 K/uL   Eosinophils Absolute 0.0  0.0 - 0.7 K/uL   Basophils Absolute 0.0  0.0 - 0.1 K/uL   Smear Review MORPHOLOGY UNREMARKABLE    COMPREHENSIVE METABOLIC PANEL     Status: Abnormal  Collection Time    12/26/13 11:45 AM      Result Value Ref Range   Sodium 123 (*) 137 - 147 mEq/L   Potassium 6.3 (*) 3.7 - 5.3 mEq/L   Chloride 85 (*) 96 - 112 mEq/L   CO2 <7 (*) 19 - 32 mEq/L   Comment: CRITICAL RESULT CALLED TO, READ BACK BY AND VERIFIED WITH:     E.POSTEN RN AT 1225 ON [id number] BY C.BONGEL   Glucose, Bld 473 (*) 70 - 99 mg/dL   BUN 29 (*) 6 - 23 mg/dL   Creatinine, Ser 0.86  0.50 - 1.10 mg/dL   Calcium 9.7  8.4 - 10.5 mg/dL   Total Protein 7.8  6.0 - 8.3 g/dL   Albumin 4.1  3.5 - 5.2 g/dL   AST 30  0 - 37 U/L   Comment: SLIGHT HEMOLYSIS     HEMOLYSIS AT THIS LEVEL MAY AFFECT RESULT   ALT 23  0 - 35 U/L   Alkaline Phosphatase 137 (*) 39 - 117 U/L   Total Bilirubin <0.2 (*) 0.3 - 1.2 mg/dL   GFR calc non Af Amer 87 (*) >90 mL/min   GFR calc Af Amer >90  >90 mL/min   Comment: (NOTE)     The eGFR has been calculated using the CKD EPI equation.     This calculation has not been validated in all clinical situations.     eGFR's persistently <90 mL/min signify possible Chronic Kidney     Disease.  LIPASE, BLOOD     Status: None   Collection Time    12/26/13 11:45 AM      Result Value Ref Range   Lipase 12  11 - 59 U/L  CBG MONITORING, ED     Status: Abnormal   Collection Time    12/26/13 12:51 PM      Result Value Ref Range   Glucose-Capillary 397 (*) 70 - 99 mg/dL  URINALYSIS, ROUTINE W REFLEX MICROSCOPIC     Status: Abnormal   Collection Time    12/26/13  1:32 PM      Result Value Ref Range   Color, Urine YELLOW  YELLOW   APPearance CLEAR   CLEAR   Specific Gravity, Urine 1.020  1.005 - 1.030   pH 5.0  5.0 - 8.0   Glucose, UA >1000 (*) NEGATIVE mg/dL   Hgb urine dipstick MODERATE (*) NEGATIVE   Bilirubin Urine NEGATIVE  NEGATIVE   Ketones, ur >80 (*) NEGATIVE mg/dL   Protein, ur 30 (*) NEGATIVE mg/dL   Urobilinogen, UA 0.2  0.0 - 1.0 mg/dL   Nitrite NEGATIVE  NEGATIVE   Leukocytes, UA NEGATIVE  NEGATIVE  URINE RAPID DRUG SCREEN (HOSP PERFORMED)     Status: None   Collection Time    12/26/13  1:32 PM      Result Value Ref Range   Opiates NONE DETECTED  NONE DETECTED   Cocaine NONE DETECTED  NONE DETECTED   Benzodiazepines NONE DETECTED  NONE DETECTED   Amphetamines NONE DETECTED  NONE DETECTED   Tetrahydrocannabinol NONE DETECTED  NONE DETECTED   Barbiturates NONE DETECTED  NONE DETECTED   Comment:            DRUG SCREEN FOR MEDICAL PURPOSES     ONLY.  IF CONFIRMATION IS NEEDED     FOR ANY PURPOSE, NOTIFY LAB     WITHIN 5 DAYS.  LOWEST DETECTABLE LIMITS     FOR URINE DRUG SCREEN     Drug Class       Cutoff (ng/mL)     Amphetamine      1000     Barbiturate      200     Benzodiazepine   932     Tricyclics       671     Opiates          300     Cocaine          300     THC              50  URINE MICROSCOPIC-ADD ON     Status: Abnormal   Collection Time    12/26/13  1:32 PM      Result Value Ref Range   Squamous Epithelial / LPF RARE  RARE   WBC, UA 0-2  <3 WBC/hpf   Casts GRANULAR CAST (*) NEGATIVE  I-STAT CHEM 8, ED     Status: Abnormal   Collection Time    12/26/13  1:44 PM      Result Value Ref Range   Sodium 131 (*) 137 - 147 mEq/L   Potassium 5.5 (*) 3.7 - 5.3 mEq/L   Chloride 109  96 - 112 mEq/L   BUN 25 (*) 6 - 23 mg/dL   Creatinine, Ser 0.70  0.50 - 1.10 mg/dL   Glucose, Bld 372 (*) 70 - 99 mg/dL   Calcium, Ion 1.09 (*) 1.12 - 1.23 mmol/L   TCO2 8  0 - 100 mmol/L   Hemoglobin 17.0 (*) 12.0 - 15.0 g/dL   HCT 50.0 (*) 36.0 - 46.0 %   Comment NOTIFIED PHYSICIAN    CBG MONITORING,  ED     Status: Abnormal   Collection Time    12/26/13  2:02 PM      Result Value Ref Range   Glucose-Capillary 318 (*) 70 - 99 mg/dL  MRSA PCR SCREENING     Status: None   Collection Time    12/26/13  2:47 PM      Result Value Ref Range   MRSA by PCR NEGATIVE  NEGATIVE   Comment:            The GeneXpert MRSA Assay (FDA     approved for NASAL specimens     only), is one component of a     comprehensive MRSA colonization     surveillance program. It is not     intended to diagnose MRSA     infection nor to guide or     monitor treatment for     MRSA infections.  GLUCOSE, CAPILLARY     Status: Abnormal   Collection Time    12/26/13  2:55 PM      Result Value Ref Range   Glucose-Capillary 214 (*) 70 - 99 mg/dL  BASIC METABOLIC PANEL     Status: Abnormal   Collection Time    12/26/13  3:52 PM      Result Value Ref Range   Sodium 129 (*) 137 - 147 mEq/L   Potassium 4.7  3.7 - 5.3 mEq/L   Chloride 100  96 - 112 mEq/L   Comment: DELTA CHECK NOTED   CO2 7 (*) 19 - 32 mEq/L   Comment: CRITICAL RESULT CALLED TO, READ BACK BY AND VERIFIED WITH:     S.DILLON RN AT 1638 ON [id number] BY C.BONGEL   Glucose,  Bld 183 (*) 70 - 99 mg/dL   BUN 20  6 - 23 mg/dL   Creatinine, Ser 0.69  0.50 - 1.10 mg/dL   Calcium 7.1 (*) 8.4 - 10.5 mg/dL   GFR calc non Af Amer >90  >90 mL/min   GFR calc Af Amer >90  >90 mL/min   Comment: (NOTE)     The eGFR has been calculated using the CKD EPI equation.     This calculation has not been validated in all clinical situations.     eGFR's persistently <90 mL/min signify possible Chronic Kidney     Disease.  GLUCOSE, CAPILLARY     Status: Abnormal   Collection Time    12/26/13  3:59 PM      Result Value Ref Range   Glucose-Capillary 171 (*) 70 - 99 mg/dL  GLUCOSE, CAPILLARY     Status: Abnormal   Collection Time    12/26/13  5:03 PM      Result Value Ref Range   Glucose-Capillary 165 (*) 70 - 99 mg/dL  GLUCOSE, CAPILLARY     Status: Abnormal    Collection Time    12/26/13  6:08 PM      Result Value Ref Range   Glucose-Capillary 167 (*) 70 - 99 mg/dL  BASIC METABOLIC PANEL     Status: Abnormal   Collection Time    12/26/13  6:18 PM      Result Value Ref Range   Sodium 132 (*) 137 - 147 mEq/L   Potassium 5.0  3.7 - 5.3 mEq/L   Chloride 97  96 - 112 mEq/L   CO2 12 (*) 19 - 32 mEq/L   Glucose, Bld 187 (*) 70 - 99 mg/dL   BUN 18  6 - 23 mg/dL   Creatinine, Ser 0.69  0.50 - 1.10 mg/dL   Calcium 7.6 (*) 8.4 - 10.5 mg/dL   GFR calc non Af Amer >90  >90 mL/min   GFR calc Af Amer >90  >90 mL/min   Comment: (NOTE)     The eGFR has been calculated using the CKD EPI equation.     This calculation has not been validated in all clinical situations.     eGFR's persistently <90 mL/min signify possible Chronic Kidney     Disease.  GLUCOSE, CAPILLARY     Status: Abnormal   Collection Time    12/26/13  7:11 PM      Result Value Ref Range   Glucose-Capillary 173 (*) 70 - 99 mg/dL   Comment 1 Documented in Chart     Comment 2 Notify RN    BASIC METABOLIC PANEL     Status: Abnormal   Collection Time    12/26/13  8:17 PM      Result Value Ref Range   Sodium 133 (*) 137 - 147 mEq/L   Potassium 5.3  3.7 - 5.3 mEq/L   Chloride 99  96 - 112 mEq/L   CO2 8 (*) 19 - 32 mEq/L   Comment: CRITICAL RESULT CALLED TO, READ BACK BY AND VERIFIED WITH:     MICHELLE, RN AT 2105 ON 12/26/13 BY W.SHEA   Glucose, Bld 197 (*) 70 - 99 mg/dL   BUN 15  6 - 23 mg/dL   Creatinine, Ser 0.66  0.50 - 1.10 mg/dL   Calcium 7.7 (*) 8.4 - 10.5 mg/dL   GFR calc non Af Amer >90  >90 mL/min   GFR calc Af Amer >90  >90 mL/min  Comment: (NOTE)     The eGFR has been calculated using the CKD EPI equation.     This calculation has not been validated in all clinical situations.     eGFR's persistently <90 mL/min signify possible Chronic Kidney     Disease.  GLUCOSE, CAPILLARY     Status: Abnormal   Collection Time    12/26/13  8:17 PM      Result Value Ref Range    Glucose-Capillary 196 (*) 70 - 99 mg/dL  GLUCOSE, CAPILLARY     Status: Abnormal   Collection Time    12/26/13  9:00 PM      Result Value Ref Range   Glucose-Capillary 178 (*) 70 - 99 mg/dL  BASIC METABOLIC PANEL     Status: Abnormal   Collection Time    12/26/13 10:00 PM      Result Value Ref Range   Sodium 133 (*) 137 - 147 mEq/L   Potassium 5.4 (*) 3.7 - 5.3 mEq/L   Chloride 98  96 - 112 mEq/L   CO2 10 (*) 19 - 32 mEq/L   Comment: CRITICAL RESULT CALLED TO, READ BACK BY AND VERIFIED WITH:     MICHELLE,RN AT 2230 ON 12/26/13 BY W.SHEA   Glucose, Bld 201 (*) 70 - 99 mg/dL   BUN 13  6 - 23 mg/dL   Creatinine, Ser 0.60  0.50 - 1.10 mg/dL   Calcium 7.6 (*) 8.4 - 10.5 mg/dL   GFR calc non Af Amer >90  >90 mL/min   GFR calc Af Amer >90  >90 mL/min   Comment: (NOTE)     The eGFR has been calculated using the CKD EPI equation.     This calculation has not been validated in all clinical situations.     eGFR's persistently <90 mL/min signify possible Chronic Kidney     Disease.  GLUCOSE, CAPILLARY     Status: Abnormal   Collection Time    12/26/13 10:11 PM      Result Value Ref Range   Glucose-Capillary 177 (*) 70 - 99 mg/dL  GLUCOSE, CAPILLARY     Status: Abnormal   Collection Time    12/26/13 11:07 PM      Result Value Ref Range   Glucose-Capillary 184 (*) 70 - 99 mg/dL  GLUCOSE, CAPILLARY     Status: Abnormal   Collection Time    12/27/13 12:16 AM      Result Value Ref Range   Glucose-Capillary 183 (*) 70 - 99 mg/dL  BASIC METABOLIC PANEL     Status: Abnormal   Collection Time    12/27/13  1:00 AM      Result Value Ref Range   Sodium 134 (*) 137 - 147 mEq/L   Potassium 4.1  3.7 - 5.3 mEq/L   Comment: DELTA CHECK NOTED     REPEATED TO VERIFY   Chloride 97  96 - 112 mEq/L   CO2 18 (*) 19 - 32 mEq/L   Glucose, Bld 182 (*) 70 - 99 mg/dL   BUN 11  6 - 23 mg/dL   Creatinine, Ser 0.62  0.50 - 1.10 mg/dL   Calcium 7.6 (*) 8.4 - 10.5 mg/dL   GFR calc non Af Amer >90  >90  mL/min   GFR calc Af Amer >90  >90 mL/min   Comment: (NOTE)     The eGFR has been calculated using the CKD EPI equation.     This calculation has not been validated in  all clinical situations.     eGFR's persistently <90 mL/min signify possible Chronic Kidney     Disease.  GLUCOSE, CAPILLARY     Status: Abnormal   Collection Time    12/27/13  1:15 AM      Result Value Ref Range   Glucose-Capillary 154 (*) 70 - 99 mg/dL  GLUCOSE, CAPILLARY     Status: Abnormal   Collection Time    12/27/13  2:13 AM      Result Value Ref Range   Glucose-Capillary 155 (*) 70 - 99 mg/dL  BASIC METABOLIC PANEL     Status: Abnormal   Collection Time    12/27/13  2:51 AM      Result Value Ref Range   Sodium 129 (*) 137 - 147 mEq/L   Potassium 3.7  3.7 - 5.3 mEq/L   Chloride 95 (*) 96 - 112 mEq/L   CO2 17 (*) 19 - 32 mEq/L   Glucose, Bld 153 (*) 70 - 99 mg/dL   BUN 11  6 - 23 mg/dL   Creatinine, Ser <0.20 (*) 0.50 - 1.10 mg/dL   Comment: REPEATED TO VERIFY     DELTA CHECK NOTED   Calcium 7.5 (*) 8.4 - 10.5 mg/dL   GFR calc non Af Amer NOT CALCULATED  >90 mL/min   GFR calc Af Amer NOT CALCULATED  >90 mL/min   Comment: (NOTE)     The eGFR has been calculated using the CKD EPI equation.     This calculation has not been validated in all clinical situations.     eGFR's persistently <90 mL/min signify possible Chronic Kidney     Disease.  GLUCOSE, CAPILLARY     Status: Abnormal   Collection Time    12/27/13  3:22 AM      Result Value Ref Range   Glucose-Capillary 133 (*) 70 - 99 mg/dL  BASIC METABOLIC PANEL     Status: Abnormal   Collection Time    12/27/13  4:24 AM      Result Value Ref Range   Sodium 133 (*) 137 - 147 mEq/L   Potassium 4.1  3.7 - 5.3 mEq/L   Chloride 100  96 - 112 mEq/L   CO2 18 (*) 19 - 32 mEq/L   Glucose, Bld 146 (*) 70 - 99 mg/dL   BUN 11  6 - 23 mg/dL   Creatinine, Ser 0.61  0.50 - 1.10 mg/dL   Comment: DELTA CHECK NOTED     REPEATED TO VERIFY   Calcium 7.7 (*) 8.4 -  10.5 mg/dL   GFR calc non Af Amer >90  >90 mL/min   GFR calc Af Amer >90  >90 mL/min   Comment: (NOTE)     The eGFR has been calculated using the CKD EPI equation.     This calculation has not been validated in all clinical situations.     eGFR's persistently <90 mL/min signify possible Chronic Kidney     Disease.  CBC     Status: Abnormal   Collection Time    12/27/13  4:24 AM      Result Value Ref Range   WBC 9.1  4.0 - 10.5 K/uL   RBC 4.19  3.87 - 5.11 MIL/uL   Hemoglobin 13.0  12.0 - 15.0 g/dL   Comment: RESULT REPEATED AND VERIFIED     DELTA CHECK NOTED   HCT 35.0 (*) 36.0 - 46.0 %   MCV 83.5  78.0 - 100.0 fL  MCH 31.0  26.0 - 34.0 pg   MCHC 37.1 (*) 30.0 - 36.0 g/dL   Comment: RULED OUT INTERFERING SUBSTANCES   RDW 11.9  11.5 - 15.5 %   Platelets 262  150 - 400 K/uL  GLUCOSE, CAPILLARY     Status: Abnormal   Collection Time    12/27/13  4:41 AM      Result Value Ref Range   Glucose-Capillary 138 (*) 70 - 99 mg/dL  GLUCOSE, CAPILLARY     Status: Abnormal   Collection Time    12/27/13  5:40 AM      Result Value Ref Range   Glucose-Capillary 126 (*) 70 - 99 mg/dL  GLUCOSE, CAPILLARY     Status: Abnormal   Collection Time    12/27/13  6:34 AM      Result Value Ref Range   Glucose-Capillary 130 (*) 70 - 99 mg/dL  BASIC METABOLIC PANEL     Status: Abnormal   Collection Time    12/27/13  6:45 AM      Result Value Ref Range   Sodium 134 (*) 137 - 147 mEq/L   Potassium 4.4  3.7 - 5.3 mEq/L   Chloride 98  96 - 112 mEq/L   CO2 17 (*) 19 - 32 mEq/L   Glucose, Bld 142 (*) 70 - 99 mg/dL   BUN 11  6 - 23 mg/dL   Creatinine, Ser 0.59  0.50 - 1.10 mg/dL   Calcium 8.2 (*) 8.4 - 10.5 mg/dL   GFR calc non Af Amer >90  >90 mL/min   GFR calc Af Amer >90  >90 mL/min   Comment: (NOTE)     The eGFR has been calculated using the CKD EPI equation.     This calculation has not been validated in all clinical situations.     eGFR's persistently <90 mL/min signify possible Chronic  Kidney     Disease.  GLUCOSE, CAPILLARY     Status: Abnormal   Collection Time    12/27/13  7:32 AM      Result Value Ref Range   Glucose-Capillary 129 (*) 70 - 99 mg/dL   Comment 1 Notify RN     Comment 2 Documented in Chart    GLUCOSE, CAPILLARY     Status: Abnormal   Collection Time    12/27/13  8:47 AM      Result Value Ref Range   Glucose-Capillary 177 (*) 70 - 99 mg/dL  GLUCOSE, CAPILLARY     Status: Abnormal   Collection Time    12/27/13  9:55 AM      Result Value Ref Range   Glucose-Capillary 192 (*) 70 - 99 mg/dL  GLUCOSE, CAPILLARY     Status: Abnormal   Collection Time    12/27/13 10:55 AM      Result Value Ref Range   Glucose-Capillary 266 (*) 70 - 99 mg/dL  RAPID STREP SCREEN     Status: None   Collection Time    12/27/13 11:00 AM      Result Value Ref Range   Streptococcus, Group A Screen (Direct) NEGATIVE  NEGATIVE   Comment: (NOTE)     A Rapid Antigen test may result negative if the antigen level in the     sample is below the detection level of this test. The FDA has not     cleared this test as a stand-alone test therefore the rapid antigen     negative result has reflexed to a  Group A Strep culture.  CULTURE, GROUP A STREP     Status: None   Collection Time    12/27/13 11:00 AM      Result Value Ref Range   Specimen Description THROAT     Special Requests NONE     Culture       Value: No Beta Hemolytic Streptococci Isolated     Performed at [facility]   Report Status 12/29/2013 FINAL    HEMOGLOBIN A1C     Status: Abnormal   Collection Time    12/27/13 11:50 AM      Result Value Ref Range   Hemoglobin A1C 11.9 (*) <5.7 %   Comment: (NOTE)                                                                               According to the ADA Clinical Practice Recommendations for 2011, when     HbA1c is used as a screening test:      >=6.5%   Diagnostic of Diabetes Mellitus               (if abnormal result is confirmed)     5.7-6.4%    Increased risk of developing Diabetes Mellitus     References:Diagnosis and Classification of Diabetes Mellitus,Diabetes     BCWU,8891,69(IHWTU 1):S62-S69 and Standards of Medical Care in             Diabetes - 2011,Diabetes Care,2011,34 (Suppl 1):S11-S61.   Mean Plasma Glucose 295 (*) <117 mg/dL   Comment: Performed at [facility]     Status: Abnormal   Collection Time    12/27/13 11:52 AM      Result Value Ref Range   Sodium 131 (*) 137 - 147 mEq/L   Potassium 4.3  3.7 - 5.3 mEq/L   Chloride 95 (*) 96 - 112 mEq/L   CO2 14 (*) 19 - 32 mEq/L   Glucose, Bld 321 (*) 70 - 99 mg/dL   BUN 12  6 - 23 mg/dL   Creatinine, Ser 0.61  0.50 - 1.10 mg/dL   Calcium 7.8 (*) 8.4 - 10.5 mg/dL   GFR calc non Af Amer >90  >90 mL/min   GFR calc Af Amer >90  >90 mL/min   Comment: (NOTE)     The eGFR has been calculated using the CKD EPI equation.     This calculation has not been validated in all clinical situations.     eGFR's persistently <90 mL/min signify possible Chronic Kidney     Disease.  GLUCOSE, CAPILLARY     Status: Abnormal   Collection Time    12/27/13 11:59 AM      Result Value Ref Range   Glucose-Capillary 283 (*) 70 - 99 mg/dL  GLUCOSE, CAPILLARY     Status: Abnormal   Collection Time    12/27/13  1:02 PM      Result Value Ref Range   Glucose-Capillary 288 (*) 70 - 99 mg/dL  GLUCOSE, CAPILLARY     Status: Abnormal   Collection Time    12/27/13  2:01 PM      Result Value Ref Range  Glucose-Capillary 348 (*) 70 - 99 mg/dL  GLUCOSE, CAPILLARY     Status: Abnormal   Collection Time    12/27/13  3:03 PM      Result Value Ref Range   Glucose-Capillary 306 (*) 70 - 99 mg/dL  BASIC METABOLIC PANEL     Status: Abnormal   Collection Time    12/27/13  4:00 PM      Result Value Ref Range   Sodium 133 (*) 137 - 147 mEq/L   Potassium 3.6 (*) 3.7 - 5.3 mEq/L   Chloride 98  96 - 112 mEq/L   CO2 19  19 - 32 mEq/L   Glucose, Bld 227 (*) 70 - 99 mg/dL    BUN 12  6 - 23 mg/dL   Creatinine, Ser 0.58  0.50 - 1.10 mg/dL   Calcium 7.8 (*) 8.4 - 10.5 mg/dL   GFR calc non Af Amer >90  >90 mL/min   GFR calc Af Amer >90  >90 mL/min   Comment: (NOTE)     The eGFR has been calculated using the CKD EPI equation.     This calculation has not been validated in all clinical situations.     eGFR's persistently <90 mL/min signify possible Chronic Kidney     Disease.  GLUCOSE, CAPILLARY     Status: Abnormal   Collection Time    12/27/13  4:00 PM      Result Value Ref Range   Glucose-Capillary 224 (*) 70 - 99 mg/dL  GLUCOSE, CAPILLARY     Status: Abnormal   Collection Time    12/27/13  5:11 PM      Result Value Ref Range   Glucose-Capillary 146 (*) 70 - 99 mg/dL  GLUCOSE, CAPILLARY     Status: Abnormal   Collection Time    12/27/13  6:10 PM      Result Value Ref Range   Glucose-Capillary 106 (*) 70 - 99 mg/dL  GLUCOSE, CAPILLARY     Status: Abnormal   Collection Time    12/27/13  7:40 PM      Result Value Ref Range   Glucose-Capillary 148 (*) 70 - 99 mg/dL   Comment 1 Documented in Chart     Comment 2 Notify RN    BASIC METABOLIC PANEL     Status: Abnormal   Collection Time    12/27/13  8:18 PM      Result Value Ref Range   Sodium 134 (*) 137 - 147 mEq/L   Potassium 3.4 (*) 3.7 - 5.3 mEq/L   Chloride 100  96 - 112 mEq/L   CO2 19  19 - 32 mEq/L   Glucose, Bld 156 (*) 70 - 99 mg/dL   BUN 11  6 - 23 mg/dL   Creatinine, Ser 0.62  0.50 - 1.10 mg/dL   Calcium 8.1 (*) 8.4 - 10.5 mg/dL   GFR calc non Af Amer >90  >90 mL/min   GFR calc Af Amer >90  >90 mL/min   Comment: (NOTE)     The eGFR has been calculated using the CKD EPI equation.     This calculation has not been validated in all clinical situations.     eGFR's persistently <90 mL/min signify possible Chronic Kidney     Disease.  GLUCOSE, CAPILLARY     Status: Abnormal   Collection Time    12/27/13  9:26 PM      Result Value Ref Range   Glucose-Capillary 133 (*) 70 - 99  mg/dL   GLUCOSE, CAPILLARY     Status: Abnormal   Collection Time    12/27/13 10:30 PM      Result Value Ref Range   Glucose-Capillary 148 (*) 70 - 99 mg/dL   Comment 1 Documented in Chart     Comment 2 Notify RN    GLUCOSE, CAPILLARY     Status: Abnormal   Collection Time    12/27/13 11:27 PM      Result Value Ref Range   Glucose-Capillary 147 (*) 70 - 99 mg/dL   Comment 1 Notify RN    BASIC METABOLIC PANEL     Status: Abnormal   Collection Time    12/28/13 12:00 AM      Result Value Ref Range   Sodium 137  137 - 147 mEq/L   Potassium 3.4 (*) 3.7 - 5.3 mEq/L   Chloride 103  96 - 112 mEq/L   CO2 21  19 - 32 mEq/L   Glucose, Bld 157 (*) 70 - 99 mg/dL   BUN 12  6 - 23 mg/dL   Creatinine, Ser 0.56  0.50 - 1.10 mg/dL   Calcium 8.1 (*) 8.4 - 10.5 mg/dL   GFR calc non Af Amer >90  >90 mL/min   GFR calc Af Amer >90  >90 mL/min   Comment: (NOTE)     The eGFR has been calculated using the CKD EPI equation.     This calculation has not been validated in all clinical situations.     eGFR's persistently <90 mL/min signify possible Chronic Kidney     Disease.  GLUCOSE, CAPILLARY     Status: Abnormal   Collection Time    12/28/13 12:33 AM      Result Value Ref Range   Glucose-Capillary 134 (*) 70 - 99 mg/dL  GLUCOSE, CAPILLARY     Status: Abnormal   Collection Time    12/28/13  1:33 AM      Result Value Ref Range   Glucose-Capillary 162 (*) 70 - 99 mg/dL   Comment 1 Notify RN    BASIC METABOLIC PANEL     Status: Abnormal   Collection Time    12/28/13  3:56 AM      Result Value Ref Range   Sodium 138  137 - 147 mEq/L   Potassium 3.2 (*) 3.7 - 5.3 mEq/L   Chloride 104  96 - 112 mEq/L   CO2 20  19 - 32 mEq/L   Glucose, Bld 135 (*) 70 - 99 mg/dL   BUN 11  6 - 23 mg/dL   Creatinine, Ser 0.53  0.50 - 1.10 mg/dL   Calcium 7.8 (*) 8.4 - 10.5 mg/dL   GFR calc non Af Amer >90  >90 mL/min   GFR calc Af Amer >90  >90 mL/min   Comment: (NOTE)     The eGFR has been calculated using the CKD  EPI equation.     This calculation has not been validated in all clinical situations.     eGFR's persistently <90 mL/min signify possible Chronic Kidney     Disease.  GLUCOSE, CAPILLARY     Status: Abnormal   Collection Time    12/28/13  7:37 AM      Result Value Ref Range   Glucose-Capillary 242 (*) 70 - 99 mg/dL  GLUCOSE, CAPILLARY     Status: Abnormal   Collection Time    12/28/13 11:19 AM      Result Value Ref Range  Glucose-Capillary 387 (*) 70 - 99 mg/dL  GLUCOSE, CAPILLARY     Status: Abnormal   Collection Time    12/28/13  4:53 PM      Result Value Ref Range   Glucose-Capillary 424 (*) 70 - 99 mg/dL  GLUCOSE, RANDOM     Status: Abnormal   Collection Time    12/28/13  5:26 PM      Result Value Ref Range   Glucose, Bld 428 (*) 70 - 99 mg/dL  GLUCOSE, CAPILLARY     Status: Abnormal   Collection Time    12/28/13  8:07 PM      Result Value Ref Range   Glucose-Capillary 331 (*) 70 - 99 mg/dL   Comment 1 Notify RN    GLUCOSE, CAPILLARY     Status: Abnormal   Collection Time    12/28/13  9:12 PM      Result Value Ref Range   Glucose-Capillary 300 (*) 70 - 99 mg/dL   Comment 1 Notify RN    CBC     Status: Abnormal   Collection Time    12/29/13  4:55 AM      Result Value Ref Range   WBC 5.5  4.0 - 10.5 K/uL   RBC 3.94  3.87 - 5.11 MIL/uL   Hemoglobin 12.4  12.0 - 15.0 g/dL   HCT 33.7 (*) 36.0 - 46.0 %   MCV 85.5  78.0 - 100.0 fL   MCH 31.5  26.0 - 34.0 pg   MCHC 36.8 (*) 30.0 - 36.0 g/dL   RDW 12.9  11.5 - 15.5 %   Platelets 232  150 - 400 K/uL  BASIC METABOLIC PANEL     Status: Abnormal   Collection Time    12/29/13  4:55 AM      Result Value Ref Range   Sodium 138  137 - 147 mEq/L   Potassium 3.9  3.7 - 5.3 mEq/L   Comment: NO VISIBLE HEMOLYSIS     DELTA CHECK NOTED   Chloride 103  96 - 112 mEq/L   CO2 22  19 - 32 mEq/L   Glucose, Bld 258 (*) 70 - 99 mg/dL   BUN 18  6 - 23 mg/dL   Creatinine, Ser 0.61  0.50 - 1.10 mg/dL   Calcium 8.5  8.4 - 10.5 mg/dL    GFR calc non Af Amer >90  >90 mL/min   GFR calc Af Amer >90  >90 mL/min   Comment: (NOTE)     The eGFR has been calculated using the CKD EPI equation.     This calculation has not been validated in all clinical situations.     eGFR's persistently <90 mL/min signify possible Chronic Kidney     Disease.  GLUCOSE, CAPILLARY     Status: Abnormal   Collection Time    12/29/13  7:41 AM      Result Value Ref Range   Glucose-Capillary 281 (*) 70 - 99 mg/dL  GLUCOSE, CAPILLARY     Status: Abnormal   Collection Time    12/29/13 11:48 AM      Result Value Ref Range   Glucose-Capillary 207 (*) 70 - 99 mg/dL   Comment 1 Notify RN     General Appearance: alert, oriented, no acute distress and well nourished, well dressed well groomed.  She appears to be in her stated age.  Musculoskeletal: Strength & Muscle Tone: within normal limits Gait & Station: normal Patient leans: N/A  Mental status  examination Patient is casually dressed and well groomed.  She maintains fair eye contact.  She appears anxious but cooperative.  She denies any auditory or visual hallucination.  She described her mood as anxious and her affect is mood appropriate.  Her speech is clear and coherent.  Thought processes logical and goal-directed.  There were no delusions, paranoia or any obsessive thoughts.  Her attention and concentration is fair.  She denies any active or passive suicidal thoughts or homicidal thoughts.  Her psychomotor activity is good.  Her fund of knowledge is adequate.  She is alert and oriented x3.  Her insight judgment and impulse control is okay.  Established Problem, Stable/Improving (1), Review of Psycho-Social Stressors (1), Review or order clinical lab tests (1), Review and summation of old records (2), Review of Last Therapy Session (1) and Review of Medication Regimen & Side Effects (2)  Assessment: Axis I: Depressive disorder NOS  Axis II: Deferred  Axis III: See medical history  Axis IV:  Moderate  Axis V: 60   Plan:  I reviewed her discharge summary, blood work results, psychosocial stressors and her current medication.  Patient is doing better on Effexor 150 mg daily.  She has hypersomnia however she is scheduled to see a pulmonologist next month.  She has an ongoing anxiety but she does not want to change her medication.  I offered intensive outpatient program but patient wants to wait unti to see pulmonologist next month.  I discussed risk and benefits of medication.  Recommended to call us back if she has any question or any concern.  Followup in 2 months. Time spent 25 minutes.  More than 50% of the time spent in psychoeducation, counseling and coordination of care.  Discuss safety plan that anytime having active suicidal thoughts or homicidal thoughts then patient need to call 911 or go to the local emergency room.   Philopateer Strine T., MD 02/15/2014

## 2014-03-08 ENCOUNTER — Ambulatory Visit (INDEPENDENT_AMBULATORY_CARE_PROVIDER_SITE_OTHER): Payer: BC Managed Care – PPO | Admitting: Pulmonary Disease

## 2014-03-08 ENCOUNTER — Encounter: Payer: Self-pay | Admitting: Pulmonary Disease

## 2014-03-08 VITALS — BP 102/72 | HR 95 | Temp 98.2°F | Ht 62.0 in | Wt 117.8 lb

## 2014-03-08 DIAGNOSIS — G4720 Circadian rhythm sleep disorder, unspecified type: Secondary | ICD-10-CM

## 2014-03-08 NOTE — Assessment & Plan Note (Signed)
The patient has great difficulties awakening from sleep even with very loud noises or different types of alarm clocks. She feels that she sleeps well during the night, and does not have awakenings. She initially does not feel rested upon arising, but once awake she is very alert and without sleepiness. Her Epworth score today is completely normal. There is nothing by her history to suggest sleep disordered breathing, but she sleeps alone and does have a small lower jaw that can predispose her to sleep apnea. She has a history of periodic limb movements at her last sleep study, but has been on Requip for this. She does not have RLS symptoms, and therefore it is hard to know whether her periodic limb movements are controlled during the night or not. Finally, the patient has underlying endocrinology issues, and I wonder if this could be playing a role in her symptoms. I have asked her to stop melatonin and over-the-counter sleepers, and will schedule her for a sleep study to rule out disorders that can lead to difficulty with arousal in the mornings.

## 2014-03-08 NOTE — Patient Instructions (Signed)
Stop melatonin and all other over the counter sleeping medications. Will schedule for a sleep study, and will call once results available. Please discuss your sleepiness issue with your endocrinologist, to make sure your addison's or hypothyroidism is not contributing to your morning symptoms.

## 2014-03-08 NOTE — Progress Notes (Signed)
Subjective:    Patient ID: [name], female    DOB: 05-29-1981, 33 y.o.   MRN: [id number]  HPI The patient is a 33 year old female who I've been asked to see for difficulty with arousal from sleep. She tells me that she has had great difficulty waking up for years, but the last few years it has worsened. She has lost a job recently because of her inability to get to work on time. She currently has a very poor sleep schedule, where she goes to bed between 2 and 4 AM, and arises between 9:30 and 11:30 AM. She typically will take melatonin and an over-the-counter sleeper on some nights.  She sleeps very well through the night with no awakenings, but does not feel rested immediately upon arising. However, when she wakes, she does extremely well with excellent alertness and no sleepiness with inactivity. Her Epworth score today is only 7. The patient sleeps alone, and therefore it is very difficult to assess for breathing abnormalities or leg kicks. She did have a sleep study in 2011 that showed no sleep apnea but did have leg movements. She has been started on Requip without a change in her symptoms.  The patient has a history of Addison's and hypothyroidism, but I do not have records on this. She is followed closely by endocrinology.   Sleep Questionnaire What time do you typically go to bed?( Between what hours) 2-4am 2-4am at 1421 on 03/08/14 by Lilli Few, CMA How long does it take you to fall asleep? 30-45 mins 30-45 mins at 1421 on 03/08/14 by Lilli Few, CMA How many times during the night do you wake up? 1 1 at 1421 on 03/08/14 by Lilli Few, CMA What time do you get out of bed to start your day? 0930 0930 at 1421 on 03/08/14 by Lilli Few, CMA Do you drive or operate heavy machinery in your occupation? No No at 1421 on 03/08/14 by Lilli Few, CMA How much has your weight changed (up or down) over the past two years? (In pounds) 5 lb (2.268  kg) 5 lb (2.268 kg) at 1421 on 03/08/14 by Lilli Few, CMA Have you ever had a sleep study before? Yes Yes at 1421 on 03/08/14 by Lilli Few, CMA If yes, location of study? [facility], Dr. Kerman Passey [facility], Dr. Kerman Passey at 1421 on 03/08/14 by Lilli Few, CMA If yes, date of study? 2009 2009 at 1421 on 03/08/14 by Lilli Few, CMA Do you currently use CPAP? No No at 1421 on 03/08/14 by Lilli Few, CMA Do you wear oxygen at any time? No No at 1421 on 03/08/14 by Lilli Few, CMA   Review of Systems  Constitutional: Negative for fever and unexpected weight change.  HENT: Negative for congestion, dental problem, ear pain, nosebleeds, postnasal drip, rhinorrhea, sinus pressure, sneezing, sore throat and trouble swallowing.   Eyes: Negative for redness and itching.  Respiratory: Negative for cough, chest tightness, shortness of breath and wheezing.   Cardiovascular: Negative for palpitations and leg swelling.  Gastrointestinal: Negative for nausea and vomiting.  Genitourinary: Negative for dysuria.  Musculoskeletal: Negative for joint swelling.  Skin: Negative for rash.  Neurological: Negative for headaches.  Hematological: Does not bruise/bleed easily.  Psychiatric/Behavioral: Positive for dysphoric mood. The patient is nervous/anxious.        Objective:   Physical Exam Constitutional:  Well developed, no acute distress  HENT:  Nares patent without  discharge  Oropharynx without exudate, palate and uvula are normal.  +small lower jaw.  Eyes:  Perrla, eomi, no scleral icterus  Neck:  No JVD, no TMG  Cardiovascular:  Normal rate, regular rhythm, no rubs or gallops.  No murmurs        Intact distal pulses  Pulmonary :  Normal breath sounds, no stridor or respiratory distress   No rales, rhonchi, or wheezing  Abdominal:  Soft, nondistended, bowel sounds present.  No tenderness noted.   Musculoskeletal:  No  lower extremity edema noted.  Lymph Nodes:  No cervical lymphadenopathy noted  Skin:  No cyanosis noted  Neurologic:  Alert, appropriate, moves all 4 extremities without obvious deficit.         Assessment & Plan:

## 2014-04-04 ENCOUNTER — Ambulatory Visit (INDEPENDENT_AMBULATORY_CARE_PROVIDER_SITE_OTHER): Payer: BC Managed Care – PPO | Admitting: Family Medicine

## 2014-04-04 VITALS — BP 110/74 | HR 104 | Temp 98.2°F | Resp 16 | Ht 62.0 in | Wt 118.0 lb

## 2014-04-04 DIAGNOSIS — R1310 Dysphagia, unspecified: Secondary | ICD-10-CM

## 2014-04-04 LAB — POCT RAPID STREP A (OFFICE): Rapid Strep A Screen: NEGATIVE

## 2014-04-04 NOTE — Progress Notes (Signed)
[facility] [address] [place] [address] [phone]  Date:  04/04/2014   Name:  [name]   DOB:  Sep 22, 1980   MRN:  [id number]  PCP:  Lamar Blinks, MD    Chief Complaint: Sore Throat   History of Present Illness:  [name] is a 33 y.o. very pleasant female patient who presents with the following:  She has noted discomfort in her throat in early July/ late June.  She did not see anything unusual, but then she noted an ulcer on her uvula.  This ulcer seemed to heal ok.  She now notes a feeling of swelling and "feeling weird" on the right side of her throat.  She occasionally feels like food will get stuck.  At this time her throat is not actually sore.   Otherwise she is feeling well.   Her endocrinologist is Peri Jefferson at [facility].  She is hypothyroid and uses synthroid.  Her dose was adjusted a few months ago and follow-up TSH was normal   Patient Active Problem List   Diagnosis Date Noted  . 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 Depression with anxiety 12/27/2013  . DKA, type 1 12/26/2013  . Hyponatremia 12/26/2013  . Hyperkalemia 12/26/2013  . Thyroid disease   . Diabetic neuropathy   . 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 Hypersomnia   . Depression 10/11/2012  . Restless leg 06/15/2012  . Addison disease 02/14/2012    Past Medical History  Diagnosis Date  . 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 Addison disease   . Allergy   . Thyroid disease   . Hypersomnia     On Adderall  . Diabetic neuropathy   . RLS (restless legs syndrome)     Past Surgical History  Procedure Laterality Date  . Wisdom tooth extraction      History  Substance Use Topics  . Smoking status: Never Smoker   . Smokeless tobacco: Never Used  . Alcohol Use: 1.2 oz/week    2 Cans of beer per week     Comment: occasionally less than once a month, mixed drink     Family History  Problem Relation Age of Onset  . Alzheimer's disease Maternal Grandmother   . Diabetes Paternal Grandmother   . Cancer Paternal Grandfather     Allergies  Allergen Reactions  . Augmentin [Amoxicillin-Pot Clavulanate] Diarrhea    Medication list has been reviewed and updated.  Current Outpatient Prescriptions on File Prior to Visit  Medication Sig Dispense Refill  . fludrocortisone (FLORINEF) 0.1 MG tablet Take 0.05 mg by mouth daily.      Marland Kitchen gabapentin (NEURONTIN) 100 MG capsule Take 100-400 mg by mouth at bedtime. As needed for foot pain      . hydrocortisone (CORTEF) 10 MG tablet Take 10 mg by mouth daily.      . insulin lispro (HUMALOG) 100 UNIT/ML injection Inject 1-10 Units into the skin 3 (three) times daily before meals. Per sliding scale      . levothyroxine (SYNTHROID, LEVOTHROID) 50 MCG tablet Take 50 mcg by mouth daily before breakfast.      . Melatonin 1 MG TABS Take 1 mg by mouth at bedtime.      . Multiple Vitamin (MULTIVITAMIN WITH MINERALS) TABS tablet Take 1 tablet by mouth daily.      . norethindrone (MICRONOR,CAMILA,ERRIN) 0.35 MG tablet Take 1 tablet by mouth daily.      Marland Kitchen  ondansetron (ZOFRAN) 4 MG tablet Take 1 tablet (4 mg total) by mouth every 8 (eight) hours as needed for nausea or vomiting.  90 tablet  1  . ramipril (ALTACE) 1.25 MG capsule Take 1.25 mg by mouth daily.      Marland Kitchen rOPINIRole (REQUIP) 0.5 MG tablet Take 0.5-1 mg by mouth at bedtime.      Marland Kitchen venlafaxine XR (EFFEXOR-XR) 150 MG 24 hr capsule Take 1 capsule (150 mg total) by mouth daily with breakfast.  30 capsule  1   No current facility-administered medications on file prior to visit.    Review of Systems:  As per HPI- otherwise negative.   Physical Examination: Filed Vitals:   04/04/14 1452  BP: 110/74  Pulse: 108  Temp: 98.2 F (36.8 C)  Resp: 16   Filed Vitals:   04/04/14 1452  Height: 5\' 2"  (1.575 m)  Weight: 118 lb (53.524 kg)   Body mass index is 21.58  kg/(m^2). Ideal Body Weight: Weight in (lb) to have BMI = 25: 136.4  GEN: WDWN, NAD, Non-toxic, A & O x 3, looks well.  Wears an insulin pump HEENT: Atraumatic, Normocephalic. Neck supple. No masses.  Bilateral TM wnl, oropharynx normal.  PEERL,EOMI.  She does have small, palpable nodes in her bilateral neck but nothing out of the ordinary, all less than 1cm in diameter.   Ears and Nose: No external deformity. CV: RRR, No M/G/R. No JVD. No thrill. No extra heart sounds. PULM: CTA B, no wheezes, crackles, rhonchi. No retractions. No resp. distress. No accessory muscle use. EXTR: No c/c/e NEURO Normal gait.  PSYCH: Normally interactive. Conversant. Not depressed or anxious appearing.  Calm demeanor.   Results for orders placed in visit on 04/04/14  POCT RAPID STREP A (OFFICE)      Result Value Ref Range   Rapid Strep A Screen Negative  Negative    Assessment and Plan: Swallowing difficulty - Plan: US Soft Tissue Head/Neck, POCT rapid strep A, Culture, Group A Strep  Feeling of fullness in throat for about 2 months, history of hypothyrodism.  Will send for an ultrasound- if negative consider GI referral for possible UGI.  If getting worse in the meantime she will let me know   Signed Lamar Blinks, MD

## 2014-04-04 NOTE — Patient Instructions (Signed)
We will set up an ultrasound for your neck. If you get worse in the meantime please let us know!  If your ultrasound is negative we will send you to see GI

## 2014-04-06 LAB — CULTURE, GROUP A STREP: ORGANISM ID, BACTERIA: NORMAL

## 2014-04-18 ENCOUNTER — Ambulatory Visit (HOSPITAL_COMMUNITY): Payer: Self-pay | Admitting: Psychiatry

## 2014-04-21 ENCOUNTER — Ambulatory Visit (INDEPENDENT_AMBULATORY_CARE_PROVIDER_SITE_OTHER): Payer: BC Managed Care – PPO | Admitting: Surgery

## 2014-05-03 ENCOUNTER — Ambulatory Visit (INDEPENDENT_AMBULATORY_CARE_PROVIDER_SITE_OTHER): Payer: BC Managed Care – PPO | Admitting: Psychiatry

## 2014-05-03 ENCOUNTER — Encounter (HOSPITAL_COMMUNITY): Payer: Self-pay | Admitting: Psychiatry

## 2014-05-03 VITALS — BP 127/77 | HR 101 | Ht 62.0 in | Wt 116.2 lb

## 2014-05-03 DIAGNOSIS — F3289 Other specified depressive episodes: Secondary | ICD-10-CM

## 2014-05-03 DIAGNOSIS — F329 Major depressive disorder, single episode, unspecified: Secondary | ICD-10-CM

## 2014-05-03 MED ORDER — VENLAFAXINE HCL ER 150 MG PO CP24: 150.0000 mg | ORAL_CAPSULE | Freq: Every day | ORAL | Status: DC

## 2014-05-03 NOTE — Progress Notes (Signed)
[facility] Progress Note  [name] [id number] 33 y.o.  05/03/2014 4:27 PM  Chief Complaint: Medication management and followup.    History of Present Illness: [name] came for her appointment.  She is taking her Effexor as prescribed.  She is feeling less depressed less anxious.  She is scheduled to have sleep study next week .  She continues to have hypersomnia and sometimes difficulty falling asleep.  She is trying to keep her blood sugar under control and now she is taking insulin pump.  Her last hemoglobin A1c was 9.8 which was done on August 10 .  She denies any irritability, anger, mood swings.  She still have moments of nervousness and anxiety however she is feeling much better from the past.  She is seeing therapist [name] every week.  She denies any major panic attack or any feeling of hopelessness or worthlessness.  Her appetite is okay.  Her lungs are stable. She lives by herself.  Patient was seen recently by her primary care physician because of throat pain however her strep was negative.  She's not taking any antibiotic and her symptoms are resolved.  Suicidal Ideation: No Plan Formed: No Patient has means to carry out plan: No  Homicidal Ideation: No Plan Formed: No Patient has means to carry out plan: No  Review of Systems: Psychiatric: Agitation: No Hallucination: No Depressed Mood: No Insomnia: No Hypersomnia: No Altered Concentration: No Feels Worthless: No Grandiose Ideas: No Belief In Special Powers: No New/Increased Substance Abuse: No Compulsions: No  Neurologic: Headache: No Seizure: No Paresthesias: No  Medical History:  Patient has insulin dependent diabetes, Addison disease and restless leg syndrome.  She sees Dr. Posey Pronto at [facility] for the management of diabetes.  Outpatient Encounter Prescriptions as of 05/03/2014  Medication Sig  . fludrocortisone (FLORINEF) 0.1 MG tablet Take 0.05 mg by mouth daily.  Marland Kitchen gabapentin  (NEURONTIN) 100 MG capsule Take 100-400 mg by mouth at bedtime. As needed for foot pain  . hydrocortisone (CORTEF) 10 MG tablet Take 10 mg by mouth daily.  . insulin lispro (HUMALOG) 100 UNIT/ML injection Inject 1-10 Units into the skin 3 (three) times daily before meals. Per sliding scale  . levothyroxine (SYNTHROID, LEVOTHROID) 50 MCG tablet Take 50 mcg by mouth daily before breakfast.  . Multiple Vitamin (MULTIVITAMIN WITH MINERALS) TABS tablet Take 1 tablet by mouth daily.  . norethindrone (MICRONOR,CAMILA,ERRIN) 0.35 MG tablet Take 1 tablet by mouth daily.  . ondansetron (ZOFRAN) 4 MG tablet Take 1 tablet (4 mg total) by mouth every 8 (eight) hours as needed for nausea or vomiting.  . ramipril (ALTACE) 1.25 MG capsule Take 1.25 mg by mouth daily.  Marland Kitchen rOPINIRole (REQUIP) 0.5 MG tablet Take 0.5-1 mg by mouth at bedtime.  Marland Kitchen venlafaxine XR (EFFEXOR-XR) 150 MG 24 hr capsule Take 1 capsule (150 mg total) by mouth daily with breakfast.  . [DISCONTINUED] Melatonin 1 MG TABS Take 1 mg by mouth at bedtime.  . [DISCONTINUED] venlafaxine XR (EFFEXOR-XR) 150 MG 24 hr capsule Take 1 capsule (150 mg total) by mouth daily with breakfast.    Past Psychiatric History/Hospitalization(s): Patient has tried Zoloft, Wellbutrin and Celexa.  She felt the Zoloft stopped working , Wellbutrin caused tenderness and Celexa did not help. Anxiety: Yes Bipolar Disorder: No Depression: Yes Mania: No Psychosis: No Schizophrenia: No Personality Disorder: No Hospitalization for psychiatric illness: No History of Electroconvulsive Shock Therapy: No Prior Suicide Attempts: No  Physical Exam: Constitutional:  BP 127/77  Pulse  101  Ht 5\' 2"  (1.575 m)  Wt 116 lb 3.2 oz (52.708 kg)  BMI 21.25 kg/m2  LMP 02/05/2014  Recent Results (from the past 2160 hour(s))  POCT RAPID STREP A (OFFICE)     Status: None   Collection Time    04/04/14  3:56 PM      Result Value Ref Range   Rapid Strep A Screen Negative  Negative   CULTURE, GROUP A STREP     Status: None   Collection Time    04/04/14  3:56 PM      Result Value Ref Range   Organism ID, Bacteria Normal Upper Respiratory Flora     Organism ID, Bacteria No Beta Hemolytic Streptococci Isolated     General Appearance: alert, oriented, no acute distress and well nourished, well dressed well groomed.  She appears to be in her stated age.  Musculoskeletal: Strength & Muscle Tone: within normal limits Gait & Station: normal Patient leans: N/A  Mental status examination Patient is casually dressed and well groomed.  She maintains fair eye contact.  She appears anxious but cooperative.  She denies any auditory or visual hallucination.  She describes her mood good and her affect is mood appropriate. Her speech is clear and coherent.  Thought processes logical and goal-directed.  There were no delusions, paranoia or any obsessive thoughts.  Her attention and concentration is fair.  She denies any active or passive suicidal thoughts or homicidal thoughts.  Her psychomotor activity is good.  Her fund of knowledge is adequate.  She is alert and oriented x3.  Her insight judgment and impulse control is okay.  Established Problem, Stable/Improving (1), Review or order clinical lab tests (1), Review of Last Therapy Session (1) and Review of Medication Regimen & Side Effects (2)  Assessment: Axis I: Depressive disorder NOS  Axis II: Deferred  Axis III: See medical history  Axis IV: Moderate  Axis V: 60   Plan:  Patient is doing better on Effexor.  She is seeing therapists for coping and social skills.  Recommended to continue Effexor at present dose.  Patient does not have any side effects at this time.  We will followup on her sleep study results.  Recommended to call us back if she has any question or any concern.  Followup in 3 months.   Darrick Greenlaw T., MD 05/03/2014

## 2014-05-09 ENCOUNTER — Ambulatory Visit (HOSPITAL_BASED_OUTPATIENT_CLINIC_OR_DEPARTMENT_OTHER): Payer: BC Managed Care – PPO | Attending: Pulmonary Disease | Admitting: Radiology

## 2014-05-09 VITALS — Ht 62.0 in | Wt 118.0 lb

## 2014-05-09 DIAGNOSIS — G4733 Obstructive sleep apnea (adult) (pediatric): Secondary | ICD-10-CM

## 2014-05-09 DIAGNOSIS — G4720 Circadian rhythm sleep disorder, unspecified type: Secondary | ICD-10-CM

## 2014-05-09 DIAGNOSIS — G4761 Periodic limb movement disorder: Secondary | ICD-10-CM | POA: Insufficient documentation

## 2014-05-09 DIAGNOSIS — G4730 Sleep apnea, unspecified: Secondary | ICD-10-CM | POA: Diagnosis not present

## 2014-05-09 DIAGNOSIS — I493 Ventricular premature depolarization: Secondary | ICD-10-CM | POA: Insufficient documentation

## 2014-05-15 ENCOUNTER — Other Ambulatory Visit (HOSPITAL_COMMUNITY): Payer: Self-pay | Admitting: Psychiatry

## 2014-05-15 NOTE — Telephone Encounter (Signed)
Given on 04/26/14 with refills

## 2014-05-19 ENCOUNTER — Encounter: Payer: Self-pay | Admitting: Family Medicine

## 2014-05-19 ENCOUNTER — Telehealth: Payer: Self-pay | Admitting: Pulmonary Disease

## 2014-05-19 DIAGNOSIS — G4733 Obstructive sleep apnea (adult) (pediatric): Secondary | ICD-10-CM

## 2014-05-19 DIAGNOSIS — R0683 Snoring: Secondary | ICD-10-CM | POA: Insufficient documentation

## 2014-05-19 NOTE — Telephone Encounter (Signed)
Pt needs ov to review sleep study 

## 2014-05-19 NOTE — Sleep Study (Signed)
   NAME: [name] DATE OF BIRTH:  Apr 25, 1981 MEDICAL RECORD NUMBER [id number]  LOCATION: [facility]  PHYSICIAN: [place] OF STUDY: 05/09/2014  SLEEP STUDY TYPE: Nocturnal Polysomnogram               REFERRING PHYSICIAN: [name], [name], MD  INDICATION FOR STUDY: Hypersomnia with sleep apnea  EPWORTH SLEEPINESS SCORE:  9 HEIGHT: 5\' 2"  (157.5 cm)  WEIGHT: 118 lb (53.524 kg)    Body mass index is 21.58 kg/(m^2).  NECK SIZE: 14 in.  MEDICATIONS: Reviewed in the sleep record  SLEEP ARCHITECTURE: The patient had a total sleep time of 230 minutes, with adequate slow-wave sleep for age, but never achieved REM. Sleep onset latency was prolonged at 117 minutes, and sleep efficiency was poor at 65%.  RESPIRATORY DATA: The patient was found to have 2 apneas and 3 obstructive hypopneas, hitting her an AHI of only 1.3 of events per hour. The events occurred in all body positions, and there was mild snoring noted throughout.  OXYGEN DATA: There was transient oxygen desaturation as low as 92% with the patient's obstructive events  CARDIAC DATA: Isolated PVC was noted.  MOVEMENT/PARASOMNIA: The patient had 109 periodic limb movements, with 7 per hour resulting in arousal or awakening.  IMPRESSION/ RECOMMENDATION:    1) small numbers of obstructive events which do not meet the AHI criteria for the obstructive sleep apnea syndrome. The patient did have mild snoring noted throughout the night.  2) moderate numbers of periodic limb movements with associated arousal or awakening. The patient has a history of a movement disorder of sleep, and is on a dopamine agonist for treatment. She appears to have breakthrough on this study, and would consider alterations to her drug dosing or trying a different medication.   3) isolated PVC noted, but no clinically significant arrhythmias were seen    Kathee Delton Diplomate, American Board of Sleep  Medicine  ELECTRONICALLY SIGNED ON:  05/19/2014, 4:33 PM [place] PH: [phone]   FX: [phone] [place]

## 2014-05-19 NOTE — Progress Notes (Signed)
Pt needs ov to review sleep study 

## 2014-05-20 NOTE — Telephone Encounter (Signed)
lmomtcb x1 

## 2014-05-20 NOTE — Progress Notes (Signed)
lmtcb x1 phone note was sent me

## 2014-05-23 NOTE — Telephone Encounter (Signed)
Pt returned call.[name] ° °

## 2014-05-23 NOTE — Telephone Encounter (Signed)
lmomtcb x 2  

## 2014-05-23 NOTE — Telephone Encounter (Signed)
Called pt. She is scheduled to come in to discuss results on 10/26. Nothing further needed

## 2014-05-30 ENCOUNTER — Encounter: Payer: Self-pay | Admitting: Pulmonary Disease

## 2014-05-30 ENCOUNTER — Ambulatory Visit (INDEPENDENT_AMBULATORY_CARE_PROVIDER_SITE_OTHER): Payer: BC Managed Care – PPO | Admitting: Pulmonary Disease

## 2014-05-30 ENCOUNTER — Encounter (INDEPENDENT_AMBULATORY_CARE_PROVIDER_SITE_OTHER): Payer: Self-pay

## 2014-05-30 VITALS — BP 134/68 | HR 105 | Temp 98.7°F | Ht 62.0 in | Wt 116.0 lb

## 2014-05-30 DIAGNOSIS — R0683 Snoring: Secondary | ICD-10-CM

## 2014-05-30 DIAGNOSIS — G478 Other sleep disorders: Secondary | ICD-10-CM

## 2014-05-30 DIAGNOSIS — G4761 Periodic limb movement disorder: Secondary | ICD-10-CM

## 2014-05-30 DIAGNOSIS — G4720 Circadian rhythm sleep disorder, unspecified type: Secondary | ICD-10-CM

## 2014-05-30 MED ORDER — PRAMIPEXOLE DIHYDROCHLORIDE 0.5 MG PO TABS: ORAL_TABLET | ORAL | Status: DC

## 2014-05-30 NOTE — Assessment & Plan Note (Signed)
It is unclear if her difficulties awakening in the morning hours is related to her persistent limb movements noted on her sleep study, or whether it is simply related to her poor sleep hygiene and taking sleep aids at night to get to sleep. Her history is really not consistent with daytime hypersomnia and narcolepsy, since she states that once she awakens she feels rested and ready to go. I have asked her to discontinue all sleep aids, and if she has issues with sleep onset, she would benefit from cognitive behavioral therapy. I will try to treat her limb movements more aggressively to see if this helps.

## 2014-05-30 NOTE — Assessment & Plan Note (Signed)
The patient had persistent limb movements on her NPSG despite being on a dopamine agonist.  It is unclear whether this is contributing to her symptoms the following morning, but I think it is worthwhile to treat her movement disorder more aggressively to see if she sees improvement. Will try her on Mirapex in the place of Requip.

## 2014-05-30 NOTE — Progress Notes (Signed)
   Subjective:    Patient ID: [name], female    DOB: November 10, 1980, 33 y.o.   MRN: [id number]  HPI Patient comes in today for follow-up after her recent sleep study. She was found to not have obstructive sleep apnea, although did have mild snoring. There were no arrhythmias, seizures, or behavioral abnormalities. She was found to have large numbers of limb movements with significant arousal despite being on Requip. I have discussed the study with her in detail, and answered all of her questions.   Review of Systems  Constitutional: Negative for fever and unexpected weight change.  HENT: Negative for congestion, dental problem, ear pain, nosebleeds, postnasal drip, rhinorrhea, sinus pressure, sneezing, sore throat and trouble swallowing.   Eyes: Negative for redness and itching.  Respiratory: Negative for cough, chest tightness, shortness of breath and wheezing.   Cardiovascular: Negative for palpitations and leg swelling.  Gastrointestinal: Negative for nausea and vomiting.  Genitourinary: Negative for dysuria.  Musculoskeletal: Negative for joint swelling.  Skin: Negative for rash.  Neurological: Negative for headaches.  Hematological: Does not bruise/bleed easily.  Psychiatric/Behavioral: Negative for dysphoric mood. The patient is not nervous/anxious.        Objective:   Physical Exam Thin female in no acute distress Nose without purulence or discharge noted Neck without lymphadenopathy or thyromegaly Lower extremities without edema, no cyanosis Alert, does not appear to be sleepy, moves all 4 extremities.       Assessment & Plan:

## 2014-05-30 NOTE — Patient Instructions (Addendum)
Would stay off all sleep aid medications, and work on establishing a regular sleep schedule.  If you have difficulties with getting or staying asleep, would recommend that your primary physician refer you to a behavioral therapist for CBT (cognitive behavioral therapy) or see if your current counselor can do this. Stop requip, and try mirapex 0.5mg  one each night after dinner.  Try and remember to take early and after eating meal.  Please call me after trying new medication for 3 weeks, and give me an update.

## 2014-07-05 DIAGNOSIS — R2232 Localized swelling, mass and lump, left upper limb: Secondary | ICD-10-CM

## 2014-07-05 HISTORY — DX: Localized swelling, mass and lump, left upper limb: R22.32

## 2014-07-06 ENCOUNTER — Ambulatory Visit (INDEPENDENT_AMBULATORY_CARE_PROVIDER_SITE_OTHER): Payer: BC Managed Care – PPO | Admitting: Physician Assistant

## 2014-07-06 VITALS — BP 120/76 | HR 94 | Temp 98.0°F | Resp 16 | Ht 62.0 in | Wt 113.0 lb

## 2014-07-06 DIAGNOSIS — L03811 Cellulitis of head [any part, except face]: Secondary | ICD-10-CM

## 2014-07-06 MED ORDER — DOXYCYCLINE HYCLATE 100 MG PO CAPS: 100.0000 mg | ORAL_CAPSULE | Freq: Two times a day (BID) | ORAL | Status: AC

## 2014-07-06 NOTE — Progress Notes (Signed)
Subjective:    Patient ID: [name], female    DOB: [birth year], 33 y.o.   MRN: [id number]   PCP: Lamar Blinks, MD  Chief Complaint  Patient presents with  . Pruritis    scalp x 2 weeks   . Otalgia    today     Allergies  Allergen Reactions  . Augmentin [Amoxicillin-Pot Clavulanate] Diarrhea    Patient Active Problem List   Diagnosis Date Noted  . Snoring 05/19/2014  . 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 Depression with anxiety 12/27/2013  . DKA, type 1 12/26/2013  . Hyponatremia 12/26/2013  . Hyperkalemia 12/26/2013  . Thyroid disease   . Diabetic neuropathy   . 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 Hypersomnia   . Depression 10/11/2012  . PLMD (periodic limb movement disorder) 06/15/2012  . Addison disease 02/14/2012    Prior to Admission medications   Medication Sig Start Date End Date Taking? Authorizing Provider  fludrocortisone (FLORINEF) 0.1 MG tablet Take 0.05 mg by mouth daily.   Yes Historical Provider, MD  gabapentin (NEURONTIN) 100 MG capsule Take 400 mg by mouth at bedtime. As needed for foot pain   Yes Historical Provider, MD  hydrocortisone (CORTEF) 10 MG tablet Take 10 mg by mouth 2 (two) times daily.    Yes Historical Provider, MD  levothyroxine (SYNTHROID, LEVOTHROID) 50 MCG tablet Take 50 mcg by mouth daily before breakfast.   Yes Historical Provider, MD  Multiple Vitamin (MULTIVITAMIN WITH MINERALS) TABS tablet Take 1 tablet by mouth daily.   Yes Historical Provider, MD  norethindrone (MICRONOR,CAMILA,ERRIN) 0.35 MG tablet Take 1 tablet by mouth daily.   Yes Historical Provider, MD  NOVOLOG 100 UNIT/ML injection Insulin pump .65 units once an hour 04/29/14  Yes Historical Provider, MD  pramipexole (MIRAPEX) 0.5 MG tablet Take 1 tablet each night after dinner 05/30/14  Yes Kathee Delton, MD  ramipril (ALTACE) 1.25 MG capsule Take 1.25 mg by mouth daily.   Yes Historical Provider,  MD  venlafaxine XR (EFFEXOR-XR) 150 MG 24 hr capsule Take 1 capsule (150 mg total) by mouth daily with breakfast. 05/03/14  Yes Kathlee Nations, MD  ondansetron (ZOFRAN) 4 MG tablet Take 1 tablet (4 mg total) by mouth every 8 (eight) hours as needed for nausea or vomiting. Patient not taking: Reported on 07/06/2014 12/29/13   Theodis Blaze, MD  rOPINIRole (REQUIP) 0.5 MG tablet Take 0.5-1 mg by mouth at bedtime.    Historical Provider, MD    Medical, Surgical, Family and Social History reviewed and updated.  HPI  This 33 y.o. female presents for evaluation of scalp lesions. Long-standing intermittent scalp itching/scratching. "When I'm stressed or bored." Had it under control, and then used a new product, and scalp got really irritated. "I just cant stop, I can't leave them alone." She has a history of cellulitis of lesions that she picks at repeatedly, and as these have become painful, is concerned that may have happened again. Her dermatologist prescribed a steroid product for the itching, and she is out. Her pharmacy is getting authorization for a refill. She was seen at this office for this complaint in 2013.  Both ears bothering her, noticed while she was in our waiting room. It's not something that she'd seek care for, but mentioned it to the [facility] during triage.  Review of Systems No fever, chills.    Objective:   Physical Exam  Constitutional: She is oriented  to person, place, and time. She appears well-developed and well-nourished. She is active and cooperative. No distress.  BP 120/76 mmHg  Pulse 94  Temp(Src) 98 F (36.7 C) (Oral)  Resp 16  Ht 5\' 2"  (1.575 m)  Wt 113 lb (51.256 kg)  BMI 20.66 kg/m2  SpO2 98%  LMP 05/07/2014   HENT:  Head:    Three excoriations on the scalp, each with erythematous base, measuring 3-5 mm. Not fluctuant. Mildly tender.  Eyes: Conjunctivae are normal.  Pulmonary/Chest: Effort normal.  Neurological: She is alert and oriented to person, place,  and time.  Psychiatric: She has a normal mood and affect. Her speech is normal and behavior is normal.          Assessment & Plan:  1. Cellulitis of scalp Anticipatory guidance and supportive care.  Meds ordered this encounter  Medications  . doxycycline (VIBRAMYCIN) 100 MG capsule    Sig: Take 1 capsule (100 mg total) by mouth 2 (two) times daily.    Dispense:  20 capsule    Refill:  0    Order Specific Question:  Supervising Provider    Answer:  Leandrew Koyanagi [[id number]]   Patient Instructions  Do not apply the steroid to the lesions that are painful, but it is ok to use it on itchy spots. For the tender places, use an OTC antibiotic ointment. Take the Doxycyline WITH FOOD to reduce the risk of GI upset.   Fara Chute, PA-C Physician Assistant-Certified [facility]

## 2014-07-06 NOTE — Patient Instructions (Signed)
Do not apply the steroid to the lesions that are painful, but it is ok to use it on itchy spots. For the tender places, use an OTC antibiotic ointment. Take the Doxycyline WITH FOOD to reduce the risk of GI upset.

## 2014-07-08 ENCOUNTER — Other Ambulatory Visit: Payer: Self-pay | Admitting: Family Medicine

## 2014-07-13 ENCOUNTER — Ambulatory Visit (HOSPITAL_COMMUNITY): Payer: Self-pay | Admitting: Psychiatry

## 2014-07-19 ENCOUNTER — Encounter (HOSPITAL_COMMUNITY): Payer: Self-pay | Admitting: *Deleted

## 2014-07-19 NOTE — Pre-Procedure Instructions (Signed)
To come for BMET 

## 2014-07-20 ENCOUNTER — Ambulatory Visit (HOSPITAL_COMMUNITY): Payer: Self-pay | Admitting: Psychiatry

## 2014-07-21 ENCOUNTER — Ambulatory Visit (INDEPENDENT_AMBULATORY_CARE_PROVIDER_SITE_OTHER): Payer: Self-pay | Admitting: Surgery

## 2014-07-21 ENCOUNTER — Encounter (HOSPITAL_BASED_OUTPATIENT_CLINIC_OR_DEPARTMENT_OTHER)
Admission: RE | Admit: 2014-07-21 | Discharge: 2014-07-21 | Disposition: A | Discharge status: Home / Self Care | Payer: BC Managed Care – PPO | Source: Ambulatory Visit | Attending: Surgery | Admitting: Surgery

## 2014-07-21 DIAGNOSIS — F329 Major depressive disorder, single episode, unspecified: Secondary | ICD-10-CM | POA: Diagnosis not present

## 2014-07-21 DIAGNOSIS — E108 Type 1 diabetes mellitus with unspecified complications: Secondary | ICD-10-CM | POA: Diagnosis not present

## 2014-07-21 DIAGNOSIS — Z880 Allergy status to penicillin: Secondary | ICD-10-CM | POA: Diagnosis not present

## 2014-07-21 DIAGNOSIS — J45909 Unspecified asthma, uncomplicated: Secondary | ICD-10-CM | POA: Diagnosis not present

## 2014-07-21 DIAGNOSIS — M549 Dorsalgia, unspecified: Secondary | ICD-10-CM | POA: Diagnosis not present

## 2014-07-21 DIAGNOSIS — Z8249 Family history of ischemic heart disease and other diseases of the circulatory system: Secondary | ICD-10-CM | POA: Diagnosis not present

## 2014-07-21 DIAGNOSIS — E780 Pure hypercholesterolemia: Secondary | ICD-10-CM | POA: Diagnosis not present

## 2014-07-21 DIAGNOSIS — Z794 Long term (current) use of insulin: Secondary | ICD-10-CM | POA: Diagnosis not present

## 2014-07-21 DIAGNOSIS — D1722 Benign lipomatous neoplasm of skin and subcutaneous tissue of left arm: Secondary | ICD-10-CM | POA: Diagnosis not present

## 2014-07-21 DIAGNOSIS — E079 Disorder of thyroid, unspecified: Secondary | ICD-10-CM | POA: Diagnosis not present

## 2014-07-21 DIAGNOSIS — D2112 Benign neoplasm of connective and other soft tissue of left upper limb, including shoulder: Secondary | ICD-10-CM | POA: Diagnosis present

## 2014-07-21 DIAGNOSIS — Z8261 Family history of arthritis: Secondary | ICD-10-CM | POA: Diagnosis not present

## 2014-07-21 DIAGNOSIS — Z8520 Personal history of malignant neoplasm of unspecified respiratory organ: Secondary | ICD-10-CM | POA: Diagnosis not present

## 2014-07-21 HISTORY — DX: Type 2 diabetes mellitus with ketoacidosis without coma: E11.10

## 2014-07-21 LAB — BASIC METABOLIC PANEL
Anion gap: 9 (ref 5–15)
BUN: 17 mg/dL (ref 6–23)
CALCIUM: 8.9 mg/dL (ref 8.4–10.5)
CO2: 27 meq/L (ref 19–32)
CREATININE: 0.52 mg/dL (ref 0.50–1.10)
Chloride: 95 mEq/L — ABNORMAL LOW (ref 96–112)
GFR calc Af Amer: >90 mL/min (ref >90)
GLUCOSE: 461 mg/dL — AB (ref 70–99)
Potassium: 4.5 mEq/L (ref 3.7–5.3)
Sodium: 131 mEq/L — ABNORMAL LOW (ref 137–147)

## 2014-07-21 NOTE — H&P (Signed)
[name] 05/31/2014 4:12 PM Location: [facility] Patient #: [id number] DOB: 08/14/1980 Single / Language: English / Race: White Female  History of Present Illness ([name] B. [name] MD; 05/31/2014 4:59 PM) Patient words: Lipoma of left anterior shoulder  The patient is a 33-year-old white female with a 20 year history of type 1 diabetes who presents with a mass on the anterior part of her left shoulder it has been there for several years although it has recently begun getting bigger. Because it is increasing in size and is starting to be intermittently sore she would like to get it removed. I explained to removal with scarring and the options. Will move forward with scheduling her.  The patient is a 33 year old female    Other Problems ([name], CMA; 05/31/2014 4:12 PM) Asthma Back Pain Depression Diabetes Mellitus Hypercholesterolemia Thyroid Disease  Past Surgical History ([name], CMA; 05/31/2014 4:12 PM) No pertinent past surgical history  Diagnostic Studies History ([name], CMA; 05/31/2014 4:12 PM) Colonoscopy never Mammogram never Pap Smear 1-5 years ago  Allergies ([name], CMA; 05/31/2014 4:14 PM) Augmentin *PENICILLINS*  Medication History ([name], CMA; 05/31/2014 4:17 PM) Florinef (0.1MG Tablet, Oral) Active. Neurontin (100MG Capsule, Oral) Active. Cortef (10MG Tablet, Oral) Active. Levotabs (50MCG Tablet, Oral) Active. Norethindrone (0.35MG Tablet, Oral) Active. NovoLOG FlexPen (100UNIT/ML Soln Pen-inj, Subcutaneous) Active. Zofran (4MG Tablet, Oral) Active. Altace (1.25MG Capsule, Oral) Active. Requip (0.5MG Tablet, Oral) Active. Effexor XR (150MG Capsule ER 24HR, Oral) Active.  Social History ([name], CMA; 05/31/2014 4:12 PM) Alcohol use Occasional alcohol use. Caffeine use Carbonated beverages, Tea. No drug use Tobacco use Never smoker.  Family History (Michele  Daniels, CMA; 05/31/2014 4:12 PM) Arthritis Father. Hypertension Father, Mother. Melanoma Father.  Pregnancy / Birth History ([name], CMA; 05/31/2014 4:12 PM) Age at menarche 12 years. Contraceptive History Oral contraceptives. Gravida 0 Irregular periods Para 0  Review of Systems ([name] CMA; 05/31/2014 4:12 PM) General Not Present- Appetite Loss, Chills, Fatigue, Fever, Night Sweats, Weight Gain and Weight Loss. Skin Present- Dryness. Not Present- Change in Wart/Mole, Hives, Jaundice, New Lesions, Non-Healing Wounds, Rash and Ulcer. HEENT Present- Oral Ulcers, Seasonal Allergies, Sinus Pain and Wears glasses/contact lenses. Not Present- Earache, Hearing Loss, Hoarseness, Nose Bleed, Ringing in the Ears, Sore Throat, Visual Disturbances and Yellow Eyes. Breast Not Present- Breast Mass, Breast Pain, Nipple Discharge and Skin Changes. Cardiovascular Not Present- Chest Pain, Difficulty Breathing Lying Down, Leg Cramps, Palpitations, Rapid Heart Rate, Shortness of Breath and Swelling of Extremities. Gastrointestinal Not Present- Abdominal Pain, Bloating, Bloody Stool, Change in Bowel Habits, Chronic diarrhea, Constipation, Difficulty Swallowing, Excessive gas, Gets full quickly at meals, Hemorrhoids, Indigestion, Nausea, Rectal Pain and Vomiting. Female Genitourinary Not Present- Frequency, Nocturia, Painful Urination, Pelvic Pain and Urgency. Musculoskeletal Present- Back Pain and Joint Stiffness. Not Present- Joint Pain, Muscle Pain, Muscle Weakness and Swelling of Extremities. Neurological Not Present- Decreased Memory, Fainting, Headaches, Numbness, Seizures, Tingling, Tremor, Trouble walking and Weakness. Psychiatric Present- Anxiety, Change in Sleep Pattern and Depression. Not Present- Bipolar, Fearful and Frequent crying. Endocrine Not Present- Cold Intolerance, Excessive Hunger, Hair Changes, Heat Intolerance, Hot flashes and New Diabetes. Hematology Not  Present- Easy Bruising, Excessive bleeding, Gland problems, HIV and Persistent Infections.   Vitals ([name] CMA; 05/31/2014 4:17 PM) 05/31/2014 4:17 PM Weight: 115.13 lb Height: 5in Body Surface Area: 0.43 m Body Mass Index: 3237.64 kg/m Temp.: 98.4F  Pulse: 89 (Regular)  BP: 120/70 (Sitting, Left Arm, Standard)    Physical   Exam ([name] B. [name] MD; 05/31/2014 5:01 PM) General Note: well-developed thin white female no acute distress   Head and Neck Note: normocephalic   Chest and Lung Exam Note: clear auscultation   Cardiovascular Note: sinus rhythm without murmurs or gallops   Musculoskeletal Note: on the left anterior shoulder his a circular mass that protrudes and is approximately 5 cm in diameter. It is not fixed to the underlying tissue and is not tender to palpation.     Assessment & Plan ([name] B. [name] MD; 05/31/2014 5:03 PM) MASS OF CHEST WALL, LEFT (786.6  R22.2) Impression: We'll schedule excision of left chest wallmass under general as an outpatient.  

## 2014-07-22 ENCOUNTER — Encounter (HOSPITAL_BASED_OUTPATIENT_CLINIC_OR_DEPARTMENT_OTHER): Payer: Self-pay

## 2014-07-22 ENCOUNTER — Ambulatory Visit (HOSPITAL_BASED_OUTPATIENT_CLINIC_OR_DEPARTMENT_OTHER): Payer: BC Managed Care – PPO | Admitting: Anesthesiology

## 2014-07-22 ENCOUNTER — Encounter (HOSPITAL_BASED_OUTPATIENT_CLINIC_OR_DEPARTMENT_OTHER)
Admission: RE | Disposition: A | Discharge status: Home / Self Care | Payer: Self-pay | Source: Ambulatory Visit | Attending: Surgery

## 2014-07-22 ENCOUNTER — Ambulatory Visit (HOSPITAL_COMMUNITY)
Admission: RE | Admit: 2014-07-22 | Discharge: 2014-07-22 | Disposition: A | Discharge status: Home / Self Care | Payer: BC Managed Care – PPO | Source: Ambulatory Visit | Attending: Surgery | Admitting: Surgery

## 2014-07-22 DIAGNOSIS — E780 Pure hypercholesterolemia: Secondary | ICD-10-CM | POA: Insufficient documentation

## 2014-07-22 DIAGNOSIS — E079 Disorder of thyroid, unspecified: Secondary | ICD-10-CM | POA: Insufficient documentation

## 2014-07-22 DIAGNOSIS — D1722 Benign lipomatous neoplasm of skin and subcutaneous tissue of left arm: Secondary | ICD-10-CM | POA: Insufficient documentation

## 2014-07-22 DIAGNOSIS — Z880 Allergy status to penicillin: Secondary | ICD-10-CM | POA: Insufficient documentation

## 2014-07-22 DIAGNOSIS — Z8261 Family history of arthritis: Secondary | ICD-10-CM | POA: Insufficient documentation

## 2014-07-22 DIAGNOSIS — M549 Dorsalgia, unspecified: Secondary | ICD-10-CM | POA: Insufficient documentation

## 2014-07-22 DIAGNOSIS — F329 Major depressive disorder, single episode, unspecified: Secondary | ICD-10-CM | POA: Insufficient documentation

## 2014-07-22 DIAGNOSIS — E108 Type 1 diabetes mellitus with unspecified complications: Secondary | ICD-10-CM | POA: Insufficient documentation

## 2014-07-22 DIAGNOSIS — J45909 Unspecified asthma, uncomplicated: Secondary | ICD-10-CM | POA: Insufficient documentation

## 2014-07-22 DIAGNOSIS — Z8249 Family history of ischemic heart disease and other diseases of the circulatory system: Secondary | ICD-10-CM | POA: Insufficient documentation

## 2014-07-22 DIAGNOSIS — Z794 Long term (current) use of insulin: Secondary | ICD-10-CM | POA: Insufficient documentation

## 2014-07-22 DIAGNOSIS — Z8520 Personal history of malignant neoplasm of unspecified respiratory organ: Secondary | ICD-10-CM | POA: Insufficient documentation

## 2014-07-22 HISTORY — DX: Personal history of other specified conditions: Z87.898

## 2014-07-22 HISTORY — DX: Hypothyroidism, unspecified: E03.9

## 2014-07-22 HISTORY — DX: Irregular menstruation, unspecified: N92.6

## 2014-07-22 HISTORY — DX: Type 1 diabetes mellitus without complications: E10.9

## 2014-07-22 HISTORY — DX: Heartburn: R12

## 2014-07-22 HISTORY — DX: Anxiety disorder, unspecified: F41.9

## 2014-07-22 HISTORY — DX: Primary adrenocortical insufficiency: E27.1

## 2014-07-22 HISTORY — PX: MASS EXCISION: SHX2000

## 2014-07-22 HISTORY — DX: Localized swelling, mass and lump, left upper limb: R22.32

## 2014-07-22 LAB — POCT HEMOGLOBIN-HEMACUE: Hemoglobin: 13.5 g/dL (ref 12.0–15.0)

## 2014-07-22 LAB — GLUCOSE, CAPILLARY: Glucose-Capillary: 250 mg/dL — ABNORMAL HIGH (ref 70–99)

## 2014-07-22 SURGERY — EXCISION MASS
Anesthesia: General | Site: Shoulder | Laterality: Left

## 2014-07-22 MED ORDER — HYDROMORPHONE HCL 1 MG/ML IJ SOLN: 0.2500 mg | INTRAMUSCULAR | Status: DC | PRN

## 2014-07-22 MED ORDER — MIDAZOLAM HCL 2 MG/2ML IJ SOLN: 1.0000 mg | INTRAMUSCULAR | Status: DC | PRN

## 2014-07-22 MED ORDER — CEFAZOLIN SODIUM-DEXTROSE 2-3 GM-% IV SOLR
INTRAVENOUS | Status: AC
  Filled 2014-07-22: qty 50

## 2014-07-22 MED ORDER — SUCCINYLCHOLINE CHLORIDE 20 MG/ML IJ SOLN
INTRAMUSCULAR | Status: AC
  Filled 2014-07-22: qty 3

## 2014-07-22 MED ORDER — FENTANYL CITRATE 0.05 MG/ML IJ SOLN: 50.0000 ug | INTRAMUSCULAR | Status: DC | PRN

## 2014-07-22 MED ORDER — CEFAZOLIN SODIUM-DEXTROSE 2-3 GM-% IV SOLR
2.0000 g | INTRAVENOUS | Status: AC
  Administered 2014-07-22: 2 g via INTRAVENOUS

## 2014-07-22 MED ORDER — ROCURONIUM BROMIDE 50 MG/5ML IV SOLN
INTRAVENOUS | Status: AC
  Filled 2014-07-22: qty 1

## 2014-07-22 MED ORDER — MIDAZOLAM HCL 5 MG/5ML IJ SOLN
INTRAMUSCULAR | Status: DC | PRN
  Administered 2014-07-22 (×2): 1 mg via INTRAVENOUS

## 2014-07-22 MED ORDER — BUPIVACAINE HCL (PF) 0.5 % IJ SOLN
INTRAMUSCULAR | Status: AC
  Filled 2014-07-22: qty 30

## 2014-07-22 MED ORDER — HEPARIN SODIUM (PORCINE) 5000 UNIT/ML IJ SOLN: 5000.0000 [IU] | Freq: Once | INTRAMUSCULAR | Status: DC

## 2014-07-22 MED ORDER — FENTANYL CITRATE 0.05 MG/ML IJ SOLN
INTRAMUSCULAR | Status: AC
  Filled 2014-07-22: qty 6

## 2014-07-22 MED ORDER — PROMETHAZINE HCL 25 MG/ML IJ SOLN: 6.2500 mg | INTRAMUSCULAR | Status: DC | PRN

## 2014-07-22 MED ORDER — LACTATED RINGERS IV SOLN
INTRAVENOUS | Status: DC
  Administered 2014-07-22: 10:00:00 via INTRAVENOUS

## 2014-07-22 MED ORDER — BUPIVACAINE HCL (PF) 0.5 % IJ SOLN
INTRAMUSCULAR | Status: DC | PRN
  Administered 2014-07-22: 3 mL

## 2014-07-22 MED ORDER — HYDROCODONE-ACETAMINOPHEN 5-325 MG PO TABS: 1.0000 | ORAL_TABLET | ORAL | Status: DC | PRN

## 2014-07-22 MED ORDER — PROPOFOL 10 MG/ML IV BOLUS
INTRAVENOUS | Status: DC | PRN
  Administered 2014-07-22: 150 mg via INTRAVENOUS

## 2014-07-22 MED ORDER — LIDOCAINE HCL (CARDIAC) 20 MG/ML IV SOLN
INTRAVENOUS | Status: DC | PRN
  Administered 2014-07-22: 50 mg via INTRAVENOUS

## 2014-07-22 MED ORDER — FENTANYL CITRATE 0.05 MG/ML IJ SOLN
INTRAMUSCULAR | Status: DC | PRN
  Administered 2014-07-22 (×2): 50 ug via INTRAVENOUS

## 2014-07-22 MED ORDER — CHLORHEXIDINE GLUCONATE 4 % EX LIQD: 1.0000 "application " | Freq: Once | CUTANEOUS | Status: DC

## 2014-07-22 MED ORDER — ONDANSETRON HCL 4 MG/2ML IJ SOLN
INTRAMUSCULAR | Status: DC | PRN
  Administered 2014-07-22: 4 mg via INTRAVENOUS

## 2014-07-22 MED ORDER — MIDAZOLAM HCL 2 MG/2ML IJ SOLN
INTRAMUSCULAR | Status: AC
  Filled 2014-07-22: qty 2

## 2014-07-22 SURGICAL SUPPLY — 44 items
APL SKNCLS STERI-STRIP NONHPOA (GAUZE/BANDAGES/DRESSINGS)
BENZOIN TINCTURE PRP APPL 2/3 (GAUZE/BANDAGES/DRESSINGS) IMPLANT
BLADE CLIPPER SURG (BLADE) IMPLANT
BLADE SURG 15 STRL LF DISP TIS (BLADE) ×1 IMPLANT
BLADE SURG 15 STRL SS (BLADE) ×2
CANISTER SUCT 1200ML W/VALVE (MISCELLANEOUS) IMPLANT
CLEANER CAUTERY TIP 5X5 PAD (MISCELLANEOUS) ×1 IMPLANT
COVER BACK TABLE 60X90IN (DRAPES) ×2 IMPLANT
COVER MAYO STAND STRL (DRAPES) ×2 IMPLANT
DECANTER SPIKE VIAL GLASS SM (MISCELLANEOUS) ×1 IMPLANT
DRAPE PED LAPAROTOMY (DRAPES) ×2 IMPLANT
DRAPE U-SHAPE 76X120 STRL (DRAPES) IMPLANT
ELECT REM PT RETURN 9FT ADLT (ELECTROSURGICAL) ×2
ELECTRODE REM PT RTRN 9FT ADLT (ELECTROSURGICAL) ×1 IMPLANT
GLOVE BIO SURGEON STRL SZ7 (GLOVE) ×1 IMPLANT
GLOVE BIO SURGEON STRL SZ8 (GLOVE) ×2 IMPLANT
GLOVE EXAM NITRILE EXT CUFF MD (GLOVE) ×1 IMPLANT
GOWN STRL REUS W/ TWL LRG LVL3 (GOWN DISPOSABLE) ×1 IMPLANT
GOWN STRL REUS W/ TWL XL LVL3 (GOWN DISPOSABLE) ×1 IMPLANT
GOWN STRL REUS W/TWL LRG LVL3 (GOWN DISPOSABLE) ×2
GOWN STRL REUS W/TWL XL LVL3 (GOWN DISPOSABLE) ×2
LIQUID BAND (GAUZE/BANDAGES/DRESSINGS) ×1 IMPLANT
NDL PRECISIONGLIDE 27X1.5 (NEEDLE) IMPLANT
NEEDLE HYPO 25X1 1.5 SAFETY (NEEDLE) ×2 IMPLANT
NEEDLE PRECISIONGLIDE 27X1.5 (NEEDLE) IMPLANT
NS IRRIG 1000ML POUR BTL (IV SOLUTION) ×1 IMPLANT
PACK BASIN DAY SURGERY FS (CUSTOM PROCEDURE TRAY) ×2 IMPLANT
PAD CLEANER CAUTERY TIP 5X5 (MISCELLANEOUS) ×1
PENCIL BUTTON HOLSTER BLD 10FT (ELECTRODE) ×2 IMPLANT
SPONGE GAUZE 4X4 12PLY STER LF (GAUZE/BANDAGES/DRESSINGS) ×1 IMPLANT
STRIP CLOSURE SKIN 1/2X4 (GAUZE/BANDAGES/DRESSINGS) IMPLANT
SUT ETHILON 3 0 FSL (SUTURE) IMPLANT
SUT ETHILON 5 0 PS 2 18 (SUTURE) IMPLANT
SUT MON AB 5-0 PS2 18 (SUTURE) ×1 IMPLANT
SUT VIC AB 4-0 SH 18 (SUTURE) ×2 IMPLANT
SUT VIC AB 5-0 PS2 18 (SUTURE) IMPLANT
SUT VICRYL 3-0 CR8 SH (SUTURE) IMPLANT
SYR BULB 3OZ (MISCELLANEOUS) ×2 IMPLANT
SYR CONTROL 10ML LL (SYRINGE) ×2 IMPLANT
TOWEL OR 17X24 6PK STRL BLUE (TOWEL DISPOSABLE) ×2 IMPLANT
TRAY DSU PREP LF (CUSTOM PROCEDURE TRAY) ×2 IMPLANT
TUBE CONNECTING 20X1/4 (TUBING) IMPLANT
UNDERPAD 30X30 INCONTINENT (UNDERPADS AND DIAPERS) IMPLANT
YANKAUER SUCT BULB TIP NO VENT (SUCTIONS) IMPLANT

## 2014-07-22 NOTE — Anesthesia Postprocedure Evaluation (Signed)
Anesthesia Post Note  Patient: [name]  Procedure(s) Performed: Procedure(s) (LRB): EXCISION LEFT ANTERIOR SHOULDER MASS (Left)  Anesthesia type: general  Patient location: PACU  Post pain: Pain level controlled  Post assessment: Patient's Cardiovascular Status Stable  Last Vitals:  Filed Vitals:   07/22/14 1200  BP: 136/97  Pulse: 101  Temp:   Resp: 18    Post vital signs: Reviewed and stable  Level of consciousness: sedated  Complications: No apparent anesthesia complications

## 2014-07-22 NOTE — H&P (View-Only) (Signed)
[name] 05/31/2014 4:12 PM Location: [facility] Patient #: [id number] DOB: Sep 03, 1980 Single / Language: Cleophus Molt / Race: White Female  History of Present Illness Rodman Key B. Hassell Done MD; 05/31/2014 4:59 PM) Patient words: Lipoma of left anterior shoulder  The patient is a 33 year old white female with a 20 year history of type 1 diabetes who presents with a mass on the anterior part of her left shoulder it has been there for several years although it has recently begun getting bigger. Because it is increasing in size and is starting to be intermittently sore she would like to get it removed. I explained to removal with scarring and the options. Will move forward with scheduling her.  The patient is a 32 year old female    Other Problems Briant Cedar, [place]; 05/31/2014 4:12 PM) Asthma Back Pain Depression Diabetes Mellitus Hypercholesterolemia Thyroid Disease  Past Surgical History Briant Cedar, [place]; 05/31/2014 4:12 PM) No pertinent past surgical history  Diagnostic Studies History Briant Cedar, [place]; 05/31/2014 4:12 PM) Colonoscopy never Mammogram never Pap Smear 1-5 years ago  Allergies Briant Cedar, CMA; 05/31/2014 4:14 PM) Augmentin *PENICILLINS*  Medication History Briant Cedar, CMA; 05/31/2014 4:17 PM) Florinef (0.1MG  Tablet, Oral) Active. Neurontin (100MG  Capsule, Oral) Active. Cortef (10MG  Tablet, Oral) Active. Levotabs (50MCG Tablet, Oral) Active. Norethindrone (0.35MG  Tablet, Oral) Active. NovoLOG FlexPen (100UNIT/ML Soln Pen-inj, Subcutaneous) Active. Zofran (4MG  Tablet, Oral) Active. Altace (1.25MG  Capsule, Oral) Active. Requip (0.5MG  Tablet, Oral) Active. Effexor XR (150MG  Capsule ER 24HR, Oral) Active.  Social History Briant Cedar, [place]; 05/31/2014 4:12 PM) Alcohol use Occasional alcohol use. Caffeine use Carbonated beverages, Tea. No drug use Tobacco use Never smoker.  Family History Briant Cedar, [place]; 05/31/2014 4:12 PM) Arthritis Father. Hypertension Father, Mother. Melanoma Father.  Pregnancy / Birth History Briant Cedar, [place]; 05/31/2014 4:12 PM) Age at menarche 47 years. Contraceptive History Oral contraceptives. Gravida 0 Irregular periods Para 0  Review of Systems Briant Cedar CMA; 05/31/2014 4:12 PM) General Not Present- Appetite Loss, Chills, Fatigue, Fever, Night Sweats, Weight Gain and Weight Loss. Skin Present- Dryness. Not Present- Change in Wart/Mole, Hives, Jaundice, New Lesions, Non-Healing Wounds, Rash and Ulcer. HEENT Present- Oral Ulcers, Seasonal Allergies, Sinus Pain and Wears glasses/contact lenses. Not Present- Earache, Hearing Loss, Hoarseness, Nose Bleed, Ringing in the Ears, Sore Throat, Visual Disturbances and Yellow Eyes. Breast Not Present- Breast Mass, Breast Pain, Nipple Discharge and Skin Changes. Cardiovascular Not Present- Chest Pain, Difficulty Breathing Lying Down, Leg Cramps, Palpitations, Rapid Heart Rate, Shortness of Breath and Swelling of Extremities. Gastrointestinal Not Present- Abdominal Pain, Bloating, Bloody Stool, Change in Bowel Habits, Chronic diarrhea, Constipation, Difficulty Swallowing, Excessive gas, Gets full quickly at meals, Hemorrhoids, Indigestion, Nausea, Rectal Pain and Vomiting. Female Genitourinary Not Present- Frequency, Nocturia, Painful Urination, Pelvic Pain and Urgency. Musculoskeletal Present- Back Pain and Joint Stiffness. Not Present- Joint Pain, Muscle Pain, Muscle Weakness and Swelling of Extremities. Neurological Not Present- Decreased Memory, Fainting, Headaches, Numbness, Seizures, Tingling, Tremor, Trouble walking and Weakness. Psychiatric Present- Anxiety, Change in Sleep Pattern and Depression. Not Present- Bipolar, Fearful and Frequent crying. Endocrine Not Present- Cold Intolerance, Excessive Hunger, Hair Changes, Heat Intolerance, Hot flashes and New Diabetes. Hematology Not  Present- Easy Bruising, Excessive bleeding, Gland problems, HIV and Persistent Infections.   Vitals Briant Cedar CMA; 05/31/2014 4:17 PM) 05/31/2014 4:17 PM Weight: 115.13 lb Height: 5in Body Surface Area: 0.43 m Body Mass Index: 3237.64 kg/m Temp.: 98.69F  Pulse: 89 (Regular)  BP: 120/70 (Sitting, Left Arm, Standard)    Physical  Exam Rodman Key B. Hassell Done MD; 05/31/2014 5:01 PM) General Note: well-developed thin white female no acute distress   Head and Neck Note: normocephalic   Chest and Lung Exam Note: clear auscultation   Cardiovascular Note: sinus rhythm without murmurs or gallops   Musculoskeletal Note: on the left anterior shoulder his a circular mass that protrudes and is approximately 5 cm in diameter. It is not fixed to the underlying tissue and is not tender to palpation.     Assessment & Plan Rodman Key B. Hassell Done MD; 05/31/2014 5:03 PM) MASS OF CHEST WALL, LEFT (786.6  R22.2) Impression: We'll schedule excision of left chest wallmass under general as an outpatient.

## 2014-07-22 NOTE — Progress Notes (Signed)
Informed Dr Tobias Alexander of glu 461 from lab on 07/21/14, states recheck this am. Called spoke with [name] she feels fine blood surgar this am 261.

## 2014-07-22 NOTE — Discharge Instructions (Signed)
May shower tomorrow ° °Post Anesthesia Home Care Instructions ° °Activity: °Get plenty of rest for the remainder of the day. A responsible adult should stay with you for 24 hours following the procedure.  °For the next 24 hours, DO NOT: °-Drive a car °-Operate machinery °-Drink alcoholic beverages °-Take any medication unless instructed by your physician °-Make any legal decisions or sign important papers. ° °Meals: °Start with liquid foods such as gelatin or soup. Progress to regular foods as tolerated. Avoid greasy, spicy, heavy foods. If nausea and/or vomiting occur, drink only clear liquids until the nausea and/or vomiting subsides. Call your physician if vomiting continues. ° °Special Instructions/Symptoms: °Your throat may feel dry or sore from the anesthesia or the breathing tube placed in your throat during surgery. If this causes discomfort, gargle with warm salt water. The discomfort should disappear within 24 hours. ° °

## 2014-07-22 NOTE — Op Note (Signed)
Surgeon: Kaylyn Lim, MD, FACS  Asst:  none  Anes:  General by LMA  Procedure: Excision of lipomatous mass of left anterior shoulder  Diagnosis: Lipomatous mass of shoulder  Complications: none  EBL:   minimal cc  Drains: none  Description of Procedure:  The patient was taken to OR 8 at [facility].  After anesthesia was administered and the patient was prepped a timeout was performed.  The area anterior on the left shoulder was marked with incision in the skin wrinkle line.  It was infiltrated with local and an incision made and carried down to the mass.  This then was dissected free from the surrounding tissue and removed and sent for path.  The incision was closed in layers with 4-0 vicryl and 5-0 monocryl.  Dermabond was placed  on the skin   The patient tolerated the procedure well and was taken to the PACU in stable condition.     Matt B. Hassell Done, [place], [facility], [place]

## 2014-07-22 NOTE — Anesthesia Procedure Notes (Signed)
Procedure Name: LMA Insertion Date/Time: 07/22/2014 10:03 AM Performed by: Melynda Ripple D Pre-anesthesia Checklist: Patient identified, Emergency Drugs available, Suction available and Patient being monitored Patient Re-evaluated:Patient Re-evaluated prior to inductionOxygen Delivery Method: Circle System Utilized Preoxygenation: Pre-oxygenation with 100% oxygen Intubation Type: IV induction Ventilation: Mask ventilation without difficulty LMA: LMA inserted LMA Size: 3.0 Number of attempts: 1 Airway Equipment and Method: bite block Placement Confirmation: positive ETCO2 Tube secured with: Tape Dental Injury: Teeth and Oropharynx as per pre-operative assessment

## 2014-07-22 NOTE — Transfer of Care (Signed)
Immediate Anesthesia Transfer of Care Note  Patient: [name]  Procedure(s) Performed: Procedure(s): EXCISION LEFT ANTERIOR SHOULDER MASS (Left)  Patient Location: PACU  Anesthesia Type:General  Level of Consciousness: sedated  Airway & Oxygen Therapy: Patient Spontanous Breathing and Patient connected to face mask oxygen  Post-op Assessment: Report given to PACU RN and Post -op Vital signs reviewed and stable  Post vital signs: Reviewed and stable  Complications: No apparent anesthesia complications

## 2014-07-22 NOTE — Anesthesia Preprocedure Evaluation (Addendum)
Anesthesia Evaluation  Patient identified by MRN, date of birth, ID band Patient awake    Reviewed: Allergy & Precautions, NPO status , Patient's Chart, lab work & pertinent test results  History of Anesthesia Complications Negative for: history of anesthetic complications  Airway Mallampati: II  TM Distance: >3 FB Neck ROM: Full    Dental  (+) Teeth Intact, Dental Advisory Given   Pulmonary neg pulmonary ROS,    Pulmonary exam normal       Cardiovascular negative cardio ROS      Neuro/Psych PSYCHIATRIC DISORDERS Anxiety Depression    GI/Hepatic negative GI ROS, Neg liver ROS,   Endo/Other  diabetesHypothyroidism   Renal/GU      Musculoskeletal   Abdominal   Peds  Hematology   Anesthesia Other Findings   Reproductive/Obstetrics                            Anesthesia Physical Anesthesia Plan  ASA: II  Anesthesia Plan: General   Post-op Pain Management:    Induction: Intravenous  Airway Management Planned: LMA  Additional Equipment:   Intra-op Plan:   Post-operative Plan: Extubation in OR  Informed Consent: I have reviewed the patients History and Physical, chart, labs and discussed the procedure including the risks, benefits and alternatives for the proposed anesthesia with the patient or authorized representative who has indicated his/her understanding and acceptance.   Dental advisory given  Plan Discussed with: CRNA, Anesthesiologist and Surgeon  Anesthesia Plan Comments: (Discussed insulin management with patient.  She will assess her glucose level and set her insulin pump immediately prior to induction.  We will monitor her glucose intra-op and treat if below 80.  She will assume control in PACU when awake.)       Anesthesia Quick Evaluation

## 2014-07-22 NOTE — Interval H&P Note (Signed)
History and Physical Interval Note:  07/22/2014 9:52 AM  [name]  has presented today for surgery, with the diagnosis of LEFT ANTERIOR SHOULDER MASS  The various methods of treatment have been discussed with the patient and family. After consideration of risks, benefits and other options for treatment, the patient has consented to  Procedure(s): EXCISION LEFT ANTERIOR SHOULDER MASS (Left) as a surgical intervention .  The patient's history has been reviewed, patient examined, no change in status, stable for surgery.  I have reviewed the patient's chart and labs.  Questions were answered to the patient's satisfaction.     Iyani Dresner B

## 2014-07-25 ENCOUNTER — Encounter (HOSPITAL_BASED_OUTPATIENT_CLINIC_OR_DEPARTMENT_OTHER): Payer: Self-pay | Admitting: Surgery

## 2014-07-26 ENCOUNTER — Ambulatory Visit (INDEPENDENT_AMBULATORY_CARE_PROVIDER_SITE_OTHER): Payer: BC Managed Care – PPO | Admitting: Physician Assistant

## 2014-07-26 VITALS — BP 114/80 | HR 94 | Temp 98.1°F | Resp 18 | Ht 62.25 in | Wt 112.4 lb

## 2014-07-26 DIAGNOSIS — R112 Nausea with vomiting, unspecified: Secondary | ICD-10-CM

## 2014-07-26 DIAGNOSIS — R197 Diarrhea, unspecified: Secondary | ICD-10-CM

## 2014-07-26 MED ORDER — ONDANSETRON 4 MG PO TBDP: 4.0000 mg | ORAL_TABLET | Freq: Three times a day (TID) | ORAL | Status: AC | PRN

## 2014-07-26 NOTE — Progress Notes (Signed)
Subjective:    Patient ID: [name], female    DOB: [birth year], 33 y.o.   MRN: [id number]  HPI  This is a 33 year old female with PMH diabetes type 1 and hypothryoidism who is presenting with diarrhea x 3 days. On the first day of illness she had diarrhea 10 times - stools were watery, no blood.  On the 2nd day of illness she had diarrhea 3 times. She was starting to feel a little better but later in the day she started vomiting. She threw up 5 times last night. She has zofran at home that she tried - she has pill form, not the dissolvable type and she states it did not work because she threw it up. Today her stools are still loose but more formed than prior. She has had 3 stools today and vomited once. She was able to eat a bagel this morning and is drinking gatorade slowly. She states her abdomen feels tender. She does not have any sick contacts. She denies fever or chills. She has a continuous glucometer - her sugars have been between 200-300 which are normal numbers for her. She has not had any lows since being ill.  Review of Systems  Constitutional: Negative for fever and chills.  Gastrointestinal: Positive for nausea, vomiting, abdominal pain and diarrhea. Negative for blood in stool.  Genitourinary: Negative for difficulty urinating.  Skin: Negative for rash.  Neurological: Negative for dizziness.   Patient Active Problem List   Diagnosis Date Noted  . Snoring 05/19/2014  . 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 Depression with anxiety 12/27/2013  . DKA, type 1 12/26/2013  . Hyponatremia 12/26/2013  . Hyperkalemia 12/26/2013  . Thyroid disease   . Diabetic neuropathy   . 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 Hypersomnia   . Depression 10/11/2012  . PLMD (periodic limb movement disorder) 06/15/2012  . Addison disease 02/14/2012   Prior to Admission medications   Medication Sig Start Date End Date Taking?  Authorizing Provider  Biotin (BIOTIN MAXIMUM STRENGTH) 10 MG TABS Take by mouth.   Yes Historical Provider, MD  cholecalciferol (VITAMIN D) 1000 UNITS tablet Take 1,000 Units by mouth daily.   Yes Historical Provider, MD  fludrocortisone (FLORINEF) 0.1 MG tablet Take 0.05 mg by mouth daily.   Yes Historical Provider, MD  gabapentin (NEURONTIN) 100 MG capsule Take 400 mg by mouth at bedtime. As needed for foot pain   Yes Historical Provider, MD  hydrocortisone (CORTEF) 10 MG tablet Take 10 mg by mouth 2 (two) times daily.    Yes Historical Provider, MD  levothyroxine (SYNTHROID, LEVOTHROID) 50 MCG tablet Take 50 mcg by mouth daily before breakfast.   Yes Historical Provider, MD  norethindrone (MICRONOR,CAMILA,ERRIN) 0.35 MG tablet Take 1 tablet by mouth daily.   Yes Historical Provider, MD  NOVOLOG 100 UNIT/ML injection Insulin pump .65 units once an hour 04/29/14  Yes Historical Provider, MD  pramipexole (MIRAPEX) 0.5 MG tablet Take 1 tablet each night after dinner Patient taking differently: Take 0.5 mg by mouth at bedtime.  05/30/14  Yes Kathee Delton, MD  ramipril (ALTACE) 1.25 MG capsule Take 1.25 mg by mouth daily.   Yes Historical Provider, MD  venlafaxine XR (EFFEXOR-XR) 150 MG 24 hr capsule Take 1 capsule (150 mg total) by mouth daily with breakfast. 05/03/14  Yes Kathlee Nations, MD  vitamin B-12 (CYANOCOBALAMIN) 1000 MCG tablet Take 2,500 mcg by mouth daily.  Yes Historical Provider, MD          Allergies  Allergen Reactions  . Augmentin [Amoxicillin-Pot Clavulanate] Diarrhea  . Adhesive [Tape] Other (See Comments)    CONTACT DERMATITIS   Patient's social and family history were reviewed.    Objective:   Physical Exam  Constitutional: She is oriented to person, place, and time. She appears well-developed and well-nourished. No distress.  HENT:  Head: Normocephalic and atraumatic.  Right Ear: Hearing normal.  Left Ear: Hearing normal.  Nose: Nose normal.  Eyes: Conjunctivae and  lids are normal. Right eye exhibits no discharge. Left eye exhibits no discharge. No scleral icterus.  Cardiovascular: Normal rate, regular rhythm, normal heart sounds, intact distal pulses and normal pulses.   No murmur heard. Not tachycardic  Pulmonary/Chest: Effort normal and breath sounds normal. No respiratory distress. She has no wheezes. She has no rhonchi. She has no rales.  Abdominal: Soft. Normal appearance and bowel sounds are normal. There is no tenderness. There is no CVA tenderness.  Musculoskeletal: Normal range of motion.  Neurological: She is alert and oriented to person, place, and time.  Skin: Skin is warm, dry and intact. No lesion and no rash noted.  Psychiatric: She has a normal mood and affect. Her speech is normal and behavior is normal. Thought content normal.      Assessment & Plan:  1. Non-intractable vomiting with nausea, vomiting of unspecified type 2. Diarrhea Pt's symptoms are improving. This is likely a viral illness. Pt's sugars have been in normal range for her. Prescribed dissolvable zofran for nausea. Counseled on BRAT diet and hydration. She will return if symptoms worsen/fail to improve or if her sugars become abnormal.  - ondansetron (ZOFRAN ODT) 4 MG disintegrating tablet; Take 1 tablet (4 mg total) by mouth every 8 (eight) hours as needed for nausea or vomiting.  Dispense: 20 tablet; Refill: 0    Benjaman Pott. Drenda Freeze, MHS [facility]  07/26/2014

## 2014-07-26 NOTE — Patient Instructions (Signed)
Try to stay hydrated - ice chips and small sips of water. See below for diet to eat while recovering Check sugars frequently. If you start getting sugars that are not normal for you, return to office. You may take zofran for continued nausea.  Food Choices to Help Relieve Diarrhea When you have diarrhea, the foods you eat and your eating habits are very important. Choosing the right foods and drinks can help relieve diarrhea. Also, because diarrhea can last up to 7 days, you need to replace lost fluids and electrolytes (such as sodium, potassium, and chloride) in order to help prevent dehydration.  WHAT GENERAL GUIDELINES DO I NEED TO FOLLOW?  Slowly drink 1 cup (8 oz) of fluid for each episode of diarrhea. If you are getting enough fluid, your urine will be clear or pale yellow.  Eat starchy foods. Some good choices include white rice, white toast, pasta, low-fiber cereal, baked potatoes (without the skin), saltine crackers, and bagels.  Avoid large servings of any cooked vegetables.  Limit fruit to two servings per day. A serving is  cup or 1 small piece.  Choose foods with less than 2 g of fiber per serving.  Limit fats to less than 8 tsp (38 g) per day.  Avoid fried foods.  Eat foods that have probiotics in them. Probiotics can be found in certain dairy products.  Avoid foods and beverages that may increase the speed at which food moves through the stomach and intestines (gastrointestinal tract). Things to avoid include:  High-fiber foods, such as dried fruit, raw fruits and vegetables, nuts, seeds, and whole grain foods.  Spicy foods and high-fat foods.  Foods and beverages sweetened with high-fructose corn syrup, honey, or sugar alcohols such as xylitol, sorbitol, and mannitol. WHAT FOODS ARE RECOMMENDED? Grains White rice. White, [place], or pita breads (fresh or toasted), including plain rolls, buns, or bagels. White pasta. Saltine, soda, or graham crackers. Pretzels.  Low-fiber cereal. Cooked cereals made with water (such as cornmeal, farina, or cream cereals). Plain muffins. Matzo. Melba toast. Zwieback.  Vegetables Potatoes (without the skin). Strained tomato and vegetable juices. Most well-cooked and canned vegetables without seeds. Tender lettuce. Fruits Cooked or canned applesauce, apricots, cherries, fruit cocktail, grapefruit, peaches, pears, or plums. Fresh bananas, apples without skin, cherries, grapes, cantaloupe, grapefruit, peaches, oranges, or plums.  Meat and Other Protein Products Baked or boiled chicken. Eggs. Tofu. Fish. Seafood. Smooth peanut butter. Ground or well-cooked tender beef, ham, veal, lamb, pork, or poultry.  Dairy Plain yogurt, kefir, and unsweetened liquid yogurt. Lactose-free milk, buttermilk, or soy milk. Plain hard cheese. Beverages Sport drinks. Clear broths. Diluted fruit juices (except prune). Regular, caffeine-free sodas such as ginger ale. Water. Decaffeinated teas. Oral rehydration solutions. Sugar-free beverages not sweetened with sugar alcohols. Other Bouillon, broth, or soups made from recommended foods.  The items listed above may not be a complete list of recommended foods or beverages. Contact your dietitian for more options. WHAT FOODS ARE NOT RECOMMENDED? Grains Whole grain, whole wheat, bran, or rye breads, rolls, pastas, crackers, and cereals. Wild or [place] rice. Cereals that contain more than 2 g of fiber per serving. Corn tortillas or taco shells. Cooked or dry oatmeal. Granola. Popcorn. Vegetables Raw vegetables. Cabbage, broccoli, Brussels sprouts, artichokes, baked beans, beet greens, corn, kale, legumes, peas, sweet potatoes, and yams. Potato skins. Cooked spinach and cabbage. Fruits Dried fruit, including raisins and dates. Raw fruits. Stewed or dried prunes. Fresh apples with skin, apricots, mangoes, pears, raspberries, and strawberries.  Meat and Other Protein Products Chunky peanut butter. Nuts and  seeds. Beans and lentils. Berniece Salines.  Dairy High-fat cheeses. Milk, chocolate milk, and beverages made with milk, such as milk shakes. Cream. Ice cream. Sweets and Desserts Sweet rolls, doughnuts, and sweet breads. Pancakes and waffles. Fats and Oils Butter. Cream sauces. Margarine. Salad oils. Plain salad dressings. Olives. Avocados.  Beverages Caffeinated beverages (such as coffee, tea, soda, or energy drinks). Alcoholic beverages. Fruit juices with pulp. Prune juice. Soft drinks sweetened with high-fructose corn syrup or sugar alcohols. Other Coconut. Hot sauce. Chili powder. Mayonnaise. Gravy. Cream-based or milk-based soups.  The items listed above may not be a complete list of foods and beverages to avoid. Contact your dietitian for more information. WHAT SHOULD I DO IF I BECOME DEHYDRATED? Diarrhea can sometimes lead to dehydration. Signs of dehydration include dark urine and dry mouth and skin. If you think you are dehydrated, you should rehydrate with an oral rehydration solution. These solutions can be purchased at pharmacies, retail stores, or online.  Drink -1 cup (120-240 mL) of oral rehydration solution each time you have an episode of diarrhea. If drinking this amount makes your diarrhea worse, try drinking smaller amounts more often. For example, drink 1-3 tsp (5-15 mL) every 5-10 minutes.  A general rule for staying hydrated is to drink 1-2 L of fluid per day. Talk to your health care provider about the specific amount you should be drinking each day. Drink enough fluids to keep your urine clear or pale yellow. Document Released: 10/12/2003 Document Revised: 07/27/2013 Document Reviewed: 06/14/2013 [facility] Patient Information 2015 [place], [place]. This information is not intended to replace advice given to you by your health care provider. Make sure you discuss any questions you have with your health care provider.

## 2014-07-28 NOTE — Progress Notes (Signed)
The patient was discussed with me and I agree with the diagnosis and treatment plan.  

## 2014-08-02 ENCOUNTER — Ambulatory Visit (HOSPITAL_COMMUNITY): Payer: Self-pay | Admitting: Psychiatry

## 2014-08-08 ENCOUNTER — Telehealth: Payer: Self-pay | Admitting: Pulmonary Disease

## 2014-08-08 NOTE — Telephone Encounter (Signed)
I am ok with her continuing to try, but would like her to take everyday, and take right after dinner (hopefully by 8pm?) Give me some feedback in 3-4 weeks.

## 2014-08-08 NOTE — Telephone Encounter (Signed)
Per 05/30/14 OV: Patient Instructions       Would stay off all sleep aid medications, and work on establishing a regular sleep schedule.  If you have difficulties with getting or staying asleep, would recommend that your primary physician refer you to a behavioral therapist for CBT (cognitive behavioral therapy) or see if your current counselor can do this. Stop requip, and try mirapex 0.5mg  one each night after dinner.  Try and remember to take early and after eating meal.   Please call me after trying new medication for 3 weeks, and give me an update.   --  Called spoke with pt. She reports she has not been taking the mirapex consistently and have not been able to tell a difference. She usually takes this about 3 times a week. She is afraid that if she takes this too late some nights and won't wake up in the mornings. She is wanting to keep trying this and needs refill. Please advise [place] thanks

## 2014-08-09 NOTE — Telephone Encounter (Signed)
Left message for pt to call back x1 

## 2014-08-10 NOTE — Telephone Encounter (Signed)
LMOM TCB x2

## 2014-08-11 NOTE — Telephone Encounter (Signed)
lmomtcb for pt 

## 2014-08-12 NOTE — Telephone Encounter (Signed)
Spoke with patient-she is aware of recs from [facility] and will call back with updates in 3-4 weeks. Nothing more needed at this time.

## 2014-08-14 ENCOUNTER — Other Ambulatory Visit: Payer: Self-pay | Admitting: Family Medicine

## 2014-08-14 ENCOUNTER — Other Ambulatory Visit (HOSPITAL_COMMUNITY): Payer: Self-pay | Admitting: Psychiatry

## 2014-08-15 ENCOUNTER — Other Ambulatory Visit (HOSPITAL_COMMUNITY): Payer: Self-pay | Admitting: Psychiatry

## 2014-08-16 ENCOUNTER — Telehealth (HOSPITAL_COMMUNITY): Payer: Self-pay

## 2014-08-16 NOTE — Telephone Encounter (Signed)
Dr. Adele Schilder fax request received from [id number] requesting a refill of patient's venafaxine HCL 150mg  CER last filled on 07/12/14.  Patient originally had an order given to her at last evaluation on 05/03/14.  Her appointment was canceled due to you being out on 05/23/14 and patient was never rescheduled.  Would you like to refill and have an appointment made for patient?

## 2014-08-22 NOTE — Telephone Encounter (Signed)
Spoke with Dr. Adele Schilder and per Dr. Adele Schilder patient needs an office visit scheduled with him for further refills on medication. Called [facility] for patient to call back

## 2014-08-24 ENCOUNTER — Other Ambulatory Visit: Payer: Self-pay | Admitting: Pulmonary Disease

## 2014-09-08 ENCOUNTER — Encounter (HOSPITAL_COMMUNITY): Payer: Self-pay | Admitting: Psychiatry

## 2014-09-08 ENCOUNTER — Ambulatory Visit (INDEPENDENT_AMBULATORY_CARE_PROVIDER_SITE_OTHER): Payer: Self-pay | Admitting: Psychiatry

## 2014-09-08 VITALS — BP 117/78 | HR 98 | Ht 62.0 in | Wt 108.4 lb

## 2014-09-08 DIAGNOSIS — F329 Major depressive disorder, single episode, unspecified: Secondary | ICD-10-CM

## 2014-09-08 DIAGNOSIS — F411 Generalized anxiety disorder: Secondary | ICD-10-CM

## 2014-09-08 MED ORDER — VENLAFAXINE HCL ER 150 MG PO CP24: 150.0000 mg | ORAL_CAPSULE | Freq: Every day | ORAL | Status: DC

## 2014-09-08 NOTE — Progress Notes (Signed)
[facility] [phone] Progress Note  [name] [id number] 34 y.o.  09/08/2014 4:10 PM  Chief Complaint: Medication management and followup.    History of Present Illness: [name] came for her appointment.  She was last seen in September and she missed appointment in December.  Overall she is taking her Effexor and denies any side effects.  Sometimes she do feel depressed sad and isolated which she believed due to cold weather and also recently she was given MiraLAX for restless leg.  She has noticed since then she is more depressed.  She used to take Requip but recently her physician changed MiraLAX.  Though patient admitted sadness and depression but she denies denies any irritability, anger, mood swing or any crying spells.  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  She sleeping good.  She denies any feeling of hopelessness or worthlessness.  She had a good Christmas.  She scheduled to see her primary care physician next month and she will have hemoglobin A1c.  But she is happy that her diabetes is getting better and she is checking her blood sugar on a regular basis.  Recently she had a shoulder surgery for lipoma which went very well.  Patient wants to continue Effexor at present dose .  She denies drinking or using any illegal substances.  Her appetite is okay.  Her vitals are stable.  Suicidal Ideation: No Plan Formed: No Patient has means to carry out plan: No  Homicidal Ideation: No Plan Formed: No Patient has means to carry out plan: No  Review of Systems  Skin: Negative for itching and rash.  Neurological: Negative for tremors.  Psychiatric/Behavioral: Positive for depression. The patient is nervous/anxious.     Psychiatric: Agitation: No Hallucination: No Depressed Mood: No Insomnia: No Hypersomnia: No Altered Concentration: No Feels Worthless: No Grandiose Ideas: No Belief In Special Powers: No New/Increased Substance Abuse: No Compulsions:  No  Neurologic: Headache: No Seizure: No Paresthesias: No  Medical History:  Patient has insulin dependent diabetes, Addison disease and restless leg syndrome.  She sees Dr. Posey Pronto at [facility] for the management of diabetes.  Outpatient Encounter Prescriptions as of 09/08/2014  Medication Sig  . Biotin (BIOTIN MAXIMUM STRENGTH) 10 MG TABS Take by mouth.  . cholecalciferol (VITAMIN D) 1000 UNITS tablet Take 1,000 Units by mouth daily.  Marland Kitchen ERRIN 0.35 MG tablet TAKE 1 TABLET (0.35 MG TOTAL) BY MOUTH DAILY.  . fludrocortisone (FLORINEF) 0.1 MG tablet Take 0.05 mg by mouth daily.  Marland Kitchen gabapentin (NEURONTIN) 100 MG capsule Take 400 mg by mouth at bedtime. As needed for foot pain  . hydrocortisone (CORTEF) 10 MG tablet Take 10 mg by mouth 2 (two) times daily.   Marland Kitchen levothyroxine (SYNTHROID, LEVOTHROID) 50 MCG tablet Take 50 mcg by mouth daily before breakfast.  . norethindrone (MICRONOR,CAMILA,ERRIN) 0.35 MG tablet Take 1 tablet by mouth daily.  Marland Kitchen NOVOLOG 100 UNIT/ML injection Insulin pump .65 units once an hour  . ondansetron (ZOFRAN ODT) 4 MG disintegrating tablet Take 1 tablet (4 mg total) by mouth every 8 (eight) hours as needed for nausea or vomiting.  . pramipexole (MIRAPEX) 0.25 MG tablet TAKE 2 TABLETS (0.5MG) EACH NIGHT AFTER DINNER  . ramipril (ALTACE) 1.25 MG capsule Take 1.25 mg by mouth daily.  Marland Kitchen venlafaxine XR (EFFEXOR-XR) 150 MG 24 hr capsule Take 1 capsule (150 mg total) by mouth daily with breakfast.  . vitamin B-12 (CYANOCOBALAMIN) 1000 MCG tablet Take 2,500 mcg by mouth daily.  . [  DISCONTINUED] venlafaxine XR (EFFEXOR-XR) 150 MG 24 hr capsule Take 1 capsule (150 mg total) by mouth daily with breakfast.    Past Psychiatric History/Hospitalization(s): Patient has tried Zoloft, Wellbutrin and Celexa.  She felt the Zoloft stopped working , Wellbutrin caused tenderness and Celexa did not help. Anxiety: Yes Bipolar Disorder: No Depression: Yes Mania: No Psychosis:  No Schizophrenia: No Personality Disorder: No Hospitalization for psychiatric illness: No History of Electroconvulsive Shock Therapy: No Prior Suicide Attempts: No  Physical Exam: Constitutional:  BP 117/78 mmHg  Pulse 98  Ht '5\' 2"'  (1.575 m)  Wt 108 lb 6.4 oz (49.17 kg)  BMI 19.82 kg/m2  Recent Results (from the past 2160 hour(s))  Basic metabolic panel     Status: Abnormal   Collection Time: 07/21/14  3:15 PM  Result Value Ref Range   Sodium 131 (L) 137 - 147 mEq/L   Potassium 4.5 3.7 - 5.3 mEq/L   Chloride 95 (L) 96 - 112 mEq/L   CO2 27 19 - 32 mEq/L   Glucose, Bld 461 (H) 70 - 99 mg/dL   BUN 17 6 - 23 mg/dL   Creatinine, Ser 0.52 0.50 - 1.10 mg/dL   Calcium 8.9 8.4 - 10.5 mg/dL   GFR calc non Af Amer >90 >90 mL/min   GFR calc Af Amer >90 >90 mL/min    Comment: (NOTE) The eGFR has been calculated using the CKD EPI equation. This calculation has not been validated in all clinical situations. eGFR's persistently <90 mL/min signify possible Chronic Kidney Disease.    Anion gap 9 5 - 15  Glucose, capillary     Status: Abnormal   Collection Time: 07/22/14  8:17 AM  Result Value Ref Range   Glucose-Capillary 250 (H) 70 - 99 mg/dL  Hemoglobin-hemacue, POC     Status: None   Collection Time: 07/22/14  9:55 AM  Result Value Ref Range   Hemoglobin 13.5 12.0 - 15.0 g/dL   General Appearance: alert, oriented, no acute distress and well nourished, well dressed well groomed.  She appears to be in her stated age.  Musculoskeletal: Strength & Muscle Tone: within normal limits Gait & Station: normal Patient leans: N/A  Mental status examination Patient is casually dressed and well groomed.  She maintains fair eye contact.  She appears anxious but cooperative.  Her speech is fast but clear and coherent.  She denies any auditory or visual hallucination.  She describes her mood good and her affect is mood appropriate.  Her thought process is logical and goal-directed.  There were  no delusions, paranoia or any obsessive thoughts.  Her attention and concentration is fair.  She denies any active or passive suicidal thoughts or homicidal thoughts.  Her psychomotor activity is good.  Her fund of knowledge is adequate.  She is alert and oriented x3.  Her insight judgment and impulse control is okay.  Established Problem, Stable/Improving (1), Review or order clinical lab tests (1), Review of Last Therapy Session (1) and Review of Medication Regimen & Side Effects (2)  Assessment: Axis I: Depressive disorder NOS  Axis II: Deferred  Axis III: See medical history  Plan:  I recommended to speak with her physician to switch back to Requip as she is feeling that MiraLAX may be causing more sadness.  Patient is not interested to increase the dose of Effexor.  She wants to wait until spring since she believed could be a cold and winter causing the depression.  I recommended to call us back  if she feels worsening of the symptoms and I will continue Effexor XR 150 mg daily.  Discussed medication side effects and benefits.  Recommended to call us back if she need early appointment otherwise I will see her again in 3 months. Time spent 25 minutes.  More than 50% of the time spent in psychoeducation, counseling and coordination of care.  Discuss safety plan that anytime having active suicidal thoughts or homicidal thoughts then patient need to call 911 or go to the local emergency room.    Fany Cavanaugh T., MD 09/08/2014

## 2014-10-17 ENCOUNTER — Ambulatory Visit (HOSPITAL_COMMUNITY): Payer: Self-pay | Admitting: Psychiatry

## 2014-10-19 ENCOUNTER — Ambulatory Visit (INDEPENDENT_AMBULATORY_CARE_PROVIDER_SITE_OTHER): Payer: BLUE CROSS/BLUE SHIELD | Admitting: Psychiatry

## 2014-10-19 ENCOUNTER — Encounter (HOSPITAL_COMMUNITY): Payer: Self-pay | Admitting: Psychiatry

## 2014-10-19 VITALS — BP 102/72 | HR 106 | Ht 62.0 in | Wt 111.0 lb

## 2014-10-19 DIAGNOSIS — F329 Major depressive disorder, single episode, unspecified: Secondary | ICD-10-CM | POA: Diagnosis not present

## 2014-10-19 DIAGNOSIS — F411 Generalized anxiety disorder: Secondary | ICD-10-CM

## 2014-10-19 DIAGNOSIS — F331 Major depressive disorder, recurrent, moderate: Secondary | ICD-10-CM

## 2014-10-19 MED ORDER — LAMOTRIGINE 25 MG PO TABS: ORAL_TABLET | ORAL | Status: DC

## 2014-10-19 NOTE — Progress Notes (Signed)
[facility] [phone] Progress Note  [name] [id number] 34 y.o.  10/19/2014 4:47 PM  Chief Complaint: I am feeling depressed and having crying spells.     History of Present Illness: [name] came earlier than her scheduled appointment.  She is feeling sad depressed and having crying spells.  Initially she felt it is a seasonal depression however she continued to feel sad and past few days 1 days in a bright light.  She admitted visit to see her parents in [place] did not help and may have caused contributing to her anxiety and depression.  She admitted back of energy and motivation.  She sleeping okay but admitted some time discouragement, low self-esteem, indecisiveness and lack of motivation.  She is also not working and she felt that herself employed business getting worse.  Patient works for [employer].  She is taking Effexor and denies any side effects of medication.  She stopped taking MiraLAX because she does not felt it helped her restless leg. She denies any paranoia or any hallucination but admitted some time hopeless and having crying spells.  She admitted not seeing her therapist in a while but promised to reschedule appointment next week.  Her appetite is okay.  Her vitals are stable.  Patient denies drinking or using any illegal substances.  She lives by herself.  Her parents live in [place].  Suicidal Ideation: No Plan Formed: No Patient has means to carry out plan: No  Homicidal Ideation: No Plan Formed: No Patient has means to carry out plan: No  Review of Systems  Constitutional: Positive for malaise/fatigue.  Skin: Negative for itching and rash.  Psychiatric/Behavioral: Positive for depression. The patient is nervous/anxious and has insomnia.     Psychiatric: Agitation: No Hallucination: No Depressed Mood: Yes Insomnia: No Hypersomnia: No Altered Concentration: No Feels Worthless: No Grandiose Ideas: No Belief In Special Powers: No New/Increased  Substance Abuse: No Compulsions: No  Neurologic: Headache: No Seizure: No Paresthesias: No  Medical History:  Patient has insulin dependent diabetes, Addison disease and restless leg syndrome.  She sees Dr. Posey Pronto at [facility] for the management of diabetes.  Outpatient Encounter Prescriptions as of 10/19/2014  Medication Sig  . Biotin (BIOTIN MAXIMUM STRENGTH) 10 MG TABS Take by mouth.  . cholecalciferol (VITAMIN D) 1000 UNITS tablet Take 1,000 Units by mouth daily.  Marland Kitchen ERRIN 0.35 MG tablet TAKE 1 TABLET (0.35 MG TOTAL) BY MOUTH DAILY.  . fludrocortisone (FLORINEF) 0.1 MG tablet Take 0.05 mg by mouth daily.  Marland Kitchen gabapentin (NEURONTIN) 100 MG capsule Take 400 mg by mouth at bedtime. As needed for foot pain  . hydrocortisone (CORTEF) 10 MG tablet Take 10 mg by mouth 2 (two) times daily.   Marland Kitchen lamoTRIgine (LAMICTAL) 25 MG tablet Take 1 tab daily for 1 week and than 2 tab daily  . levothyroxine (SYNTHROID, LEVOTHROID) 50 MCG tablet Take 50 mcg by mouth daily before breakfast.  . norethindrone (MICRONOR,CAMILA,ERRIN) 0.35 MG tablet Take 1 tablet by mouth daily.  Marland Kitchen NOVOLOG 100 UNIT/ML injection Insulin pump .65 units once an hour  . ondansetron (ZOFRAN ODT) 4 MG disintegrating tablet Take 1 tablet (4 mg total) by mouth every 8 (eight) hours as needed for nausea or vomiting.  . pramipexole (MIRAPEX) 0.25 MG tablet TAKE 2 TABLETS (0.5MG ) EACH NIGHT AFTER DINNER  . ramipril (ALTACE) 1.25 MG capsule Take 1.25 mg by mouth daily.  Marland Kitchen venlafaxine XR (EFFEXOR-XR) 150 MG 24 hr capsule Take 1 capsule (150 mg total) by mouth daily  with breakfast.  . vitamin B-12 (CYANOCOBALAMIN) 1000 MCG tablet Take 2,500 mcg by mouth daily.    Past Psychiatric History/Hospitalization(s): Patient has tried Zoloft, Wellbutrin and Celexa.  She felt the Zoloft stopped working , Wellbutrin caused tenderness and Celexa did not help. Anxiety: Yes Bipolar Disorder: No Depression: Yes Mania: No Psychosis: No Schizophrenia:  No Personality Disorder: No Hospitalization for psychiatric illness: No History of Electroconvulsive Shock Therapy: No Prior Suicide Attempts: No  Physical Exam: Constitutional:  BP 102/72 mmHg  Pulse 106  Ht 5\' 2"  (1.575 m)  Wt 111 lb (50.349 kg)  BMI 20.30 kg/m2  Recent Results (from the past 2160 hour(s))  Glucose, capillary     Status: Abnormal   Collection Time: 07/22/14  8:17 AM  Result Value Ref Range   Glucose-Capillary 250 (H) 70 - 99 mg/dL  Hemoglobin-hemacue, POC     Status: None   Collection Time: 07/22/14  9:55 AM  Result Value Ref Range   Hemoglobin 13.5 12.0 - 15.0 g/dL   General Appearance: well nourished  Musculoskeletal: Strength & Muscle Tone: within normal limits Gait & Station: normal Patient leans: N/A  Mental status examination Patient is casually dressed and well groomed.  She maintains fair eye contact.  She appears anxious and tearful. Her speech is fast but clear and coherent.  She denies any auditory or visual hallucination.  She describes her mood sad depressed and her affect is constricted. Her thought process slow but logical and goal-directed. There were no delusions, paranoia or any obsessive thoughts.  Her attention and concentration is fair.  She denies any active or passive suicidal thoughts or homicidal thoughts.  Her psychomotor activity is good.  Her fund of knowledge is adequate.  She is alert and oriented x3.  Her insight judgment and impulse control is okay.  Established Problem, Stable/Improving (1), Review of Psycho-Social Stressors (1), Review or order clinical lab tests (1), Established Problem, Worsening (2), Review of Last Therapy Session (1), Review of Medication Regimen & Side Effects (2) and Review of New Medication or Change in Dosage (2)  Assessment: Axis I: Depressive disorder NOS  Axis II: Deferred  Axis III: See medical history  Plan:  Patient is slowly decompensating and having more depressive symptoms.  She is  taking EffexorXR 150 mg denies any side effects including any tremors or shakes.  She has not seen her therapist in a while and encourage her to keep appointment with a therapist for coping skills.  I will add low-dose Lamictal 25 mg daily for 1 week and gradually increase to 50 mg daily.  Discussed medication side effects and benefits.  Recommended to call us back if she has any question or any concern.  Follow-up in 4 weeks. Time spent 25 minutes.  More than 50% of the time spent in psychoeducation, counseling and coordination of care.  Discuss safety plan that anytime having active suicidal thoughts or homicidal thoughts then patient need to call 911 or go to the local emergency room.    ARFEEN,SYED T., MD 10/19/2014

## 2014-11-09 ENCOUNTER — Ambulatory Visit (HOSPITAL_COMMUNITY): Payer: Self-pay | Admitting: Psychiatry

## 2014-11-10 ENCOUNTER — Ambulatory Visit (INDEPENDENT_AMBULATORY_CARE_PROVIDER_SITE_OTHER): Payer: BLUE CROSS/BLUE SHIELD | Admitting: Psychiatry

## 2014-11-10 ENCOUNTER — Encounter (HOSPITAL_COMMUNITY): Payer: Self-pay | Admitting: Psychiatry

## 2014-11-10 VITALS — BP 119/82 | HR 98 | Ht 62.0 in | Wt 109.0 lb

## 2014-11-10 DIAGNOSIS — F329 Major depressive disorder, single episode, unspecified: Secondary | ICD-10-CM | POA: Diagnosis not present

## 2014-11-10 DIAGNOSIS — F331 Major depressive disorder, recurrent, moderate: Secondary | ICD-10-CM

## 2014-11-10 MED ORDER — LAMOTRIGINE 100 MG PO TABS: 100.0000 mg | ORAL_TABLET | Freq: Every day | ORAL | Status: DC

## 2014-11-10 NOTE — Progress Notes (Signed)
[facility] [phone] Progress Note  [name] [id number] 34 y.o.  11/10/2014 4:44 PM  Chief Complaint: I am feeling better.  I like Lamictal.      History of Present Illness: [name] came for her appointment.  She is now taking Lamictal 50 mg and feeling better.  She denies any irritability, anger or any mood swing.  She has no rash or itching.  She feel improvement in her motivation, energy level.  She has fewer crying spells but overall she is feeling less anxious and less depressed.  She is even thinking to go back to work for [employer] which she has been a stopped in the past.  She also started seeing Dr. Ailene [name] for counseling.  She is seeing improvement in her depression and concentration.  Her sleep is better.  She denies drinking or using any illegal substances.  She denies any feeling of hopelessness or worthlessness.  Her appetite is okay.  Her vitals are stable.  She lives by herself and her parents live in [place].  Suicidal Ideation: No Plan Formed: No Patient has means to carry out plan: No  Homicidal Ideation: No Plan Formed: No Patient has means to carry out plan: No  Review of Systems  Skin: Negative for itching and rash.  Psychiatric/Behavioral: Negative for suicidal ideas and substance abuse. The patient does not have insomnia.     Psychiatric: Agitation: No Hallucination: No Depressed Mood: No Insomnia: No Hypersomnia: No Altered Concentration: No Feels Worthless: No Grandiose Ideas: No Belief In Special Powers: No New/Increased Substance Abuse: No Compulsions: No  Neurologic: Headache: No Seizure: No Paresthesias: No  Medical History:  Patient has insulin dependent diabetes, Addison disease and restless leg syndrome.  She sees Dr. Posey [name] at [facility] for the management of diabetes.  Outpatient Encounter Prescriptions as of 11/10/2014  Medication Sig  . Biotin (BIOTIN MAXIMUM STRENGTH) 10 MG TABS Take by mouth.  . cholecalciferol (VITAMIN D)  1000 UNITS tablet Take 1,000 Units by mouth daily.  Marland Kitchen ERRIN 0.35 MG tablet TAKE 1 TABLET (0.35 MG TOTAL) BY MOUTH DAILY.  . fludrocortisone (FLORINEF) 0.1 MG tablet Take 0.05 mg by mouth daily.  Marland Kitchen gabapentin (NEURONTIN) 100 MG capsule Take 400 mg by mouth at bedtime. As needed for foot pain  . hydrocortisone (CORTEF) 10 MG tablet Take 10 mg by mouth 2 (two) times daily.   Marland Kitchen lamoTRIgine (LAMICTAL) 100 MG tablet Take 1 tablet (100 mg total) by mouth daily.  Marland Kitchen levothyroxine (SYNTHROID, LEVOTHROID) 50 MCG tablet Take 50 mcg by mouth daily before breakfast.  . norethindrone (MICRONOR,CAMILA,ERRIN) 0.35 MG tablet Take 1 tablet by mouth daily.  Marland Kitchen NOVOLOG 100 UNIT/ML injection Insulin pump .65 units once an hour  . ondansetron (ZOFRAN ODT) 4 MG disintegrating tablet Take 1 tablet (4 mg total) by mouth every 8 (eight) hours as needed for nausea or vomiting.  . pramipexole (MIRAPEX) 0.25 MG tablet TAKE 2 TABLETS (0.5MG ) EACH NIGHT AFTER DINNER  . ramipril (ALTACE) 1.25 MG capsule Take 1.25 mg by mouth daily.  Marland Kitchen venlafaxine XR (EFFEXOR-XR) 150 MG 24 hr capsule Take 1 capsule (150 mg total) by mouth daily with breakfast.  . vitamin B-12 (CYANOCOBALAMIN) 1000 MCG tablet Take 2,500 mcg by mouth daily.  . [DISCONTINUED] lamoTRIgine (LAMICTAL) 25 MG tablet Take 1 tab daily for 1 week and than 2 tab daily    Past Psychiatric History/Hospitalization(s): Patient has tried Zoloft, Wellbutrin and Celexa.  She felt the Zoloft stopped working , Wellbutrin caused tenderness and  Celexa did not help. Anxiety: Yes Bipolar Disorder: No Depression: Yes Mania: No Psychosis: No Schizophrenia: No Personality Disorder: No Hospitalization for psychiatric illness: No History of Electroconvulsive Shock Therapy: No Prior Suicide Attempts: No  Physical Exam: Constitutional:  BP 119/82 mmHg  Pulse 98  Ht 5\' 2"  (1.575 m)  Wt 109 lb (49.442 kg)  BMI 19.93 kg/m2  No results found for this or any previous visit (from the  past 2160 hour(s)). General Appearance: well nourished  Musculoskeletal: Strength & Muscle Tone: within normal limits Gait & Station: normal Patient leans: N/A  Mental status examination Patient is casually dressed and well groomed.  She maintains fair eye contact.  She appears anxious but cooperative.  Her speech is clear , coherent and fluent.  She denies any auditory or visual hallucination.  She describes her mood anxious and her affect is appropriate.  Her thought process slow but logical and goal-directed. There were no delusions, paranoia or any obsessive thoughts.  Her attention and concentration is fair.  She denies any active or passive suicidal thoughts or homicidal thoughts.  Her psychomotor activity is good.  Her fund of knowledge is adequate.  She is alert and oriented x3.  Her insight judgment and impulse control is okay.  Established Problem, Stable/Improving (1), Review of Psycho-Social Stressors (1), Review of Last Therapy Session (1), Review of Medication Regimen & Side Effects (2) and Review of New Medication or Change in Dosage (2)  Assessment: Axis I: Depressive disorder NOS  Axis II: Deferred  Axis III: See medical history  Plan:  Patient is doing better on Lamictal.  I recommended to increase Lamictal 100 mg daily.  She has no rash or itching.  Continue Effexor XR 150 mg daily.  Encouraged to keep appointment with therapist.  Discussed medication side effects and benefits.  Recommended to call us back if she has any question, concern or if he feel worsening of the symptom.  I will see her again in 2 months.   [name] T., MD 11/10/2014

## 2014-11-18 ENCOUNTER — Other Ambulatory Visit (HOSPITAL_COMMUNITY): Payer: Self-pay | Admitting: Psychiatry

## 2014-11-21 NOTE — Telephone Encounter (Signed)
Refill for Effexor declined at this time due to too early to refill.  Patient returns 12/08/14.

## 2014-11-28 LAB — HEMOGLOBIN A1C

## 2014-12-08 ENCOUNTER — Ambulatory Visit (HOSPITAL_COMMUNITY): Payer: Self-pay | Admitting: Psychiatry

## 2014-12-12 ENCOUNTER — Ambulatory Visit (HOSPITAL_COMMUNITY): Payer: Self-pay | Admitting: Psychiatry

## 2014-12-14 ENCOUNTER — Other Ambulatory Visit (HOSPITAL_COMMUNITY): Payer: Self-pay | Admitting: Psychiatry

## 2014-12-15 LAB — BASIC METABOLIC PANEL
Creatinine: 0.7 mg/dL (ref <=1.1)
GLUCOSE: 206 mg/dL
SODIUM: 135 mmol/L — AB (ref 137–147)

## 2014-12-15 NOTE — Telephone Encounter (Signed)
Patient's requested Effexor refill denied per Dr. Adele Schilder until patient schedules an appointment as no showed for evaluation 12/12/14 and has not rescheduled.  Instructed patient's pharmacy of her need for an appointment.

## 2014-12-17 ENCOUNTER — Other Ambulatory Visit (HOSPITAL_COMMUNITY): Payer: Self-pay | Admitting: Psychiatry

## 2014-12-19 ENCOUNTER — Telehealth (HOSPITAL_COMMUNITY): Payer: Self-pay

## 2014-12-19 NOTE — Telephone Encounter (Signed)
RX's refills denied.  Patient was told she needed an appointment.  Note below.  Tried to contact patient.   Patient's requested Effexor refill denied per Dr. Adele Schilder until patient schedules an appointment as no showed for evaluation 12/12/14 and has not rescheduled. Instructed patient's pharmacy of her need for an appointment.

## 2014-12-19 NOTE — Telephone Encounter (Signed)
Telephone message left for patient requesting she call our office back to reschedule her recently missed appointment from 12/12/14 and to question patient's status with needed medication refills as our office received refill requests for her prescribed Lamictal and Effexor from her [facility].  Requested patient call back to verify if she needed refills and to help her make an appointment.

## 2014-12-21 NOTE — Telephone Encounter (Signed)
LMOM for patient to call back.  Per Dr. Adele Schilder must have an office visit for medication refill.

## 2014-12-30 DIAGNOSIS — G2581 Restless legs syndrome: Secondary | ICD-10-CM | POA: Diagnosis not present

## 2014-12-30 DIAGNOSIS — Z3202 Encounter for pregnancy test, result negative: Secondary | ICD-10-CM | POA: Insufficient documentation

## 2014-12-30 DIAGNOSIS — Z7952 Long term (current) use of systemic steroids: Secondary | ICD-10-CM | POA: Diagnosis not present

## 2014-12-30 DIAGNOSIS — Z793 Long term (current) use of hormonal contraceptives: Secondary | ICD-10-CM | POA: Insufficient documentation

## 2014-12-30 DIAGNOSIS — E1040 Type 1 diabetes mellitus with diabetic neuropathy, unspecified: Secondary | ICD-10-CM | POA: Diagnosis not present

## 2014-12-30 DIAGNOSIS — F419 Anxiety disorder, unspecified: Secondary | ICD-10-CM | POA: Insufficient documentation

## 2014-12-30 DIAGNOSIS — Z794 Long term (current) use of insulin: Secondary | ICD-10-CM | POA: Diagnosis not present

## 2014-12-30 DIAGNOSIS — E10649 Type 1 diabetes mellitus with hypoglycemia without coma: Secondary | ICD-10-CM | POA: Diagnosis not present

## 2014-12-30 DIAGNOSIS — Z79899 Other long term (current) drug therapy: Secondary | ICD-10-CM | POA: Diagnosis not present

## 2014-12-30 DIAGNOSIS — G40909 Epilepsy, unspecified, not intractable, without status epilepticus: Secondary | ICD-10-CM | POA: Insufficient documentation

## 2014-12-30 DIAGNOSIS — E039 Hypothyroidism, unspecified: Secondary | ICD-10-CM | POA: Diagnosis not present

## 2014-12-30 NOTE — ED Notes (Signed)
Pt presents from home via EMS for hypoglycemia. Per EMS, upon arrival CBG 22. Amp D50, po fluids upon scene. Repeat CBG 228.   VS: 118/84, 88Hr, 18Resp.

## 2014-12-31 ENCOUNTER — Emergency Department (HOSPITAL_COMMUNITY)
Admission: EM | Admit: 2014-12-31 | Discharge: 2014-12-31 | Disposition: A | Discharge status: Home / Self Care | Payer: BLUE CROSS/BLUE SHIELD | Attending: Emergency Medicine | Admitting: Emergency Medicine

## 2014-12-31 ENCOUNTER — Encounter (HOSPITAL_COMMUNITY): Payer: Self-pay | Admitting: Emergency Medicine

## 2014-12-31 DIAGNOSIS — E162 Hypoglycemia, unspecified: Secondary | ICD-10-CM

## 2014-12-31 LAB — I-STAT CHEM 8, ED
BUN: 18 mg/dL (ref 6–20)
CHLORIDE: 95 mmol/L — AB (ref 101–111)
Calcium, Ion: 1.12 mmol/L (ref 1.12–1.23)
Creatinine, Ser: 0.7 mg/dL (ref 0.44–1.00)
Glucose, Bld: 478 mg/dL — ABNORMAL HIGH (ref 65–99)
HCT: 54 % — ABNORMAL HIGH (ref 36.0–46.0)
HEMOGLOBIN: 18.4 g/dL — AB (ref 12.0–15.0)
Potassium: 3.8 mmol/L (ref 3.5–5.1)
SODIUM: 132 mmol/L — AB (ref 135–145)
TCO2: 20 mmol/L (ref 0–100)

## 2014-12-31 LAB — URINALYSIS, ROUTINE W REFLEX MICROSCOPIC
Bilirubin Urine: NEGATIVE
Glucose, UA: >1000 mg/dL — AB
Hgb urine dipstick: NEGATIVE
Ketones, ur: NEGATIVE mg/dL
Leukocytes, UA: NEGATIVE
NITRITE: NEGATIVE
PROTEIN: NEGATIVE mg/dL
Specific Gravity, Urine: 1.024 (ref 1.005–1.030)
UROBILINOGEN UA: 1 mg/dL (ref 0.0–1.0)
pH: 6.5 (ref 5.0–8.0)

## 2014-12-31 LAB — CBC
HCT: 46.9 % — ABNORMAL HIGH (ref 36.0–46.0)
Hemoglobin: 16.9 g/dL — ABNORMAL HIGH (ref 12.0–15.0)
MCH: 32.1 pg (ref 26.0–34.0)
MCHC: 36 g/dL (ref 30.0–36.0)
MCV: 89 fL (ref 78.0–100.0)
Platelets: 294 10*3/uL (ref 150–400)
RBC: 5.27 MIL/uL — ABNORMAL HIGH (ref 3.87–5.11)
RDW: 12 % (ref 11.5–15.5)
WBC: 8.8 10*3/uL (ref 4.0–10.5)

## 2014-12-31 LAB — COMPREHENSIVE METABOLIC PANEL
ALK PHOS: 116 U/L (ref 38–126)
ALT: 30 U/L (ref 14–54)
ANION GAP: 13 (ref 5–15)
AST: 42 U/L — ABNORMAL HIGH (ref 15–41)
Albumin: 4.1 g/dL (ref 3.5–5.0)
BUN: 17 mg/dL (ref 6–20)
CALCIUM: 9 mg/dL (ref 8.9–10.3)
CO2: 22 mmol/L (ref 22–32)
CREATININE: 0.68 mg/dL (ref 0.44–1.00)
Chloride: 93 mmol/L — ABNORMAL LOW (ref 101–111)
GFR calc non Af Amer: >60 mL/min (ref >60)
Glucose, Bld: 468 mg/dL — ABNORMAL HIGH (ref 65–99)
Potassium: 3.8 mmol/L (ref 3.5–5.1)
SODIUM: 128 mmol/L — AB (ref 135–145)
Total Bilirubin: 0.7 mg/dL (ref 0.3–1.2)
Total Protein: 7.5 g/dL (ref 6.5–8.1)

## 2014-12-31 LAB — LIPASE, BLOOD: Lipase: 18 U/L — ABNORMAL LOW (ref 22–51)

## 2014-12-31 LAB — URINE MICROSCOPIC-ADD ON

## 2014-12-31 LAB — CBG MONITORING, ED
Glucose-Capillary: 224 mg/dL — ABNORMAL HIGH (ref 65–99)
Glucose-Capillary: 266 mg/dL — ABNORMAL HIGH (ref 65–99)

## 2014-12-31 LAB — POC URINE PREG, ED: PREG TEST UR: NEGATIVE

## 2014-12-31 MED ORDER — SODIUM CHLORIDE 0.9 % IV BOLUS (SEPSIS)
1000.0000 mL | Freq: Once | INTRAVENOUS | Status: AC
  Administered 2014-12-31: 1000 mL via INTRAVENOUS

## 2014-12-31 NOTE — ED Notes (Signed)
PIV access attempted by 2 staff ,unsuccessful ,MD aware. IV team Nurse consulted for IV access and lab draws.

## 2014-12-31 NOTE — ED Notes (Signed)
Bed: EX61 Expected date:  Expected time:  Means of arrival:  Comments: hypoglycemic

## 2014-12-31 NOTE — ED Notes (Addendum)
Pt. REFUSED to have her Insulin pump put on HOLD or suspend , pt. Stated that her blood sugar drops because she did not eat lately because of being nauseous. Pt. got irritated for being asked by this Nurse.

## 2014-12-31 NOTE — ED Notes (Signed)
Unsuccessful lab draw by this writer. RN made aware. 

## 2014-12-31 NOTE — ED Provider Notes (Signed)
CSN: [id number]     Arrival date & time 12/30/14  2357 History   First MD Initiated Contact with Patient 12/31/14 0131     Chief Complaint  Patient presents with  . Hypoglycemia     (Consider location/radiation/quality/duration/timing/severity/associated sxs/prior Treatment) HPI  [name] is a 34 y.o. female with past medical history of Addison's disease, type 1 diabetes, hypothyroidism presenting today with hypoglycemia. Patient has had 4 days of significant nausea and vomiting. She is unable to keep anything down. Her insulin pump was on the whole time. EMS found her with a blood glucose of 22, she was sleeping at the time. She is given an amp of D50 and repeat blood sugar was 228. She denies any abdominal pain, diarrhea, fevers. She's had decrease appetite. Her insulin was recently increased in April, she denies any other changes. Patient has no recent infections. She has no further complaints.  10 Systems reviewed and are negative for acute change except as noted in the HPI.    Past Medical History  Diagnosis Date  . Diabetic neuropathy     feet  . RLS (restless legs syndrome)   . History of seizure     x 1 - due to low blood sugar  . Heartburn     occasional  . Hypothyroidism   . Addison's disease   . Anxiety   . Mass of skin of left shoulder 07/2014    anterior shoulder mass  . Irregular periods   . Insulin dependent type 1 diabetes mellitus     Insulin pump  . DKA (diabetic ketoacidoses)     h   Past Surgical History  Procedure Laterality Date  . Wisdom tooth extraction    . Mass excision Left 07/22/2014    Procedure: EXCISION LEFT ANTERIOR SHOULDER MASS;  Surgeon: Pedro Earls, MD;  Location: [facility];  Service: General;  Laterality: Left;   Family History  Problem Relation Age of Onset  . Alzheimer's disease Maternal Grandmother   . Diabetes Paternal Grandmother   . Cancer Paternal Grandfather   . Hypertension Mother   . Hypertension  Father    History  Substance Use Topics  . Smoking status: Never Smoker   . Smokeless tobacco: Never Used  . Alcohol Use: 0.0 - 1.2 oz/week    0-2 Standard drinks or equivalent per week     Comment: occasionally   OB History    No data available     Review of Systems    Allergies  Augmentin and Adhesive  Home Medications   Prior to Admission medications   Medication Sig Start Date End Date Taking? Authorizing Provider  Biotin (BIOTIN MAXIMUM STRENGTH) 10 MG TABS Take 1 tablet by mouth daily.    Yes Historical Provider, MD  cholecalciferol (VITAMIN D) 1000 UNITS tablet Take 1,000 Units by mouth daily.   Yes Historical Provider, MD  ERRIN 0.35 MG tablet TAKE 1 TABLET (0.35 MG TOTAL) BY MOUTH DAILY. 08/15/14  Yes Chelle Jeffery, PA-C  fludrocortisone (FLORINEF) 0.1 MG tablet Take 0.05 mg by mouth daily.   Yes Historical Provider, MD  gabapentin (NEURONTIN) 100 MG capsule Take 400 mg by mouth at bedtime. As needed for foot pain   Yes Historical Provider, MD  hydrocortisone (CORTEF) 10 MG tablet Take 10 mg by mouth 2 (two) times daily.    Yes Historical Provider, MD  Insulin Human (INSULIN PUMP) SOLN Inject 0.6 each into the skin 3 times daily with meals, bedtime and 2  AM.   Yes Historical Provider, MD  levothyroxine (SYNTHROID, LEVOTHROID) 50 MCG tablet Take 50 mcg by mouth daily before breakfast.   Yes Historical Provider, MD  norethindrone (MICRONOR,CAMILA,ERRIN) 0.35 MG tablet Take 1 tablet by mouth daily.   Yes Historical Provider, MD  NOVOLOG 100 UNIT/ML injection Inject 0.6 Units into the skin. Insulin pump 0.6 units once an hour 04/29/14  Yes Historical Provider, MD  ramipril (ALTACE) 1.25 MG capsule Take 1.25 mg by mouth daily.   Yes Historical Provider, MD  venlafaxine XR (EFFEXOR-XR) 150 MG 24 hr capsule Take 1 capsule (150 mg total) by mouth daily with breakfast. 09/08/14  Yes Kathlee Nations, MD  vitamin B-12 (CYANOCOBALAMIN) 1000 MCG tablet Take 2,500 mcg by mouth daily.   Yes  Historical Provider, MD  lamoTRIgine (LAMICTAL) 100 MG tablet Take 1 tablet (100 mg total) by mouth daily. Patient not taking: Reported on 12/31/2014 11/10/14   Kathlee Nations, MD  ondansetron (ZOFRAN ODT) 4 MG disintegrating tablet Take 1 tablet (4 mg total) by mouth every 8 (eight) hours as needed for nausea or vomiting. Patient not taking: Reported on 12/31/2014 07/26/14   Ezekiel Slocumb, PA-C  pramipexole (MIRAPEX) 0.25 MG tablet TAKE 2 TABLETS (0.5MG ) EACH NIGHT AFTER DINNER Patient not taking: Reported on 12/31/2014 08/25/14   Kathee Delton, MD   BP 116/88 mmHg  Pulse 93  Resp 16  SpO2 99% Physical Exam  Constitutional: She is oriented to person, place, and time. She appears well-developed and well-nourished. No distress.  HENT:  Head: Normocephalic and atraumatic.  Nose: Nose normal.  Mouth/Throat: Oropharynx is clear and moist. No oropharyngeal exudate.  Eyes: Conjunctivae and EOM are normal. Pupils are equal, round, and reactive to light. No scleral icterus.  Neck: Normal range of motion. Neck supple. No JVD present. No tracheal deviation present. No thyromegaly present.  Cardiovascular: Normal rate, regular rhythm and normal heart sounds.  Exam reveals no gallop and no friction rub.   No murmur heard. Pulmonary/Chest: Effort normal and breath sounds normal. No respiratory distress. She has no wheezes. She exhibits no tenderness.  Abdominal: Soft. Bowel sounds are normal. She exhibits no distension and no mass. There is no tenderness. There is no rebound and no guarding.  Musculoskeletal: Normal range of motion. She exhibits no edema or tenderness.  Lymphadenopathy:    She has no cervical adenopathy.  Neurological: She is alert and oriented to person, place, and time. No cranial nerve deficit. She exhibits normal muscle tone.  Skin: Skin is warm and dry. No rash noted. She is not diaphoretic. No erythema. No pallor.  Nursing note and vitals reviewed.   ED Course  Procedures  (including critical care time) Labs Review Labs Reviewed  CBC - Abnormal; Notable for the following:    RBC 5.27 (*)    Hemoglobin 16.9 (*)    HCT 46.9 (*)    All other components within normal limits  URINALYSIS, ROUTINE W REFLEX MICROSCOPIC (NOT AT [facility]) - Abnormal; Notable for the following:    Glucose, UA >1000 (*)    All other components within normal limits  COMPREHENSIVE METABOLIC PANEL - Abnormal; Notable for the following:    Sodium 128 (*)    Chloride 93 (*)    Glucose, Bld 468 (*)    AST 42 (*)    All other components within normal limits  LIPASE, BLOOD - Abnormal; Notable for the following:    Lipase 18 (*)    All other components within  normal limits  CBG MONITORING, ED - Abnormal; Notable for the following:    Glucose-Capillary 224 (*)    All other components within normal limits  I-STAT CHEM 8, ED - Abnormal; Notable for the following:    Sodium 132 (*)    Chloride 95 (*)    Glucose, Bld 478 (*)    Hemoglobin 18.4 (*)    HCT 54.0 (*)    All other components within normal limits  CBG MONITORING, ED - Abnormal; Notable for the following:    Glucose-Capillary 266 (*)    All other components within normal limits  URINE MICROSCOPIC-ADD ON  POC URINE PREG, ED  CBG MONITORING, ED  CBG MONITORING, ED    Imaging Review No results found.   EKG Interpretation None      MDM   Final diagnoses:  None   patient since emergency department for hypoglycemia. She'll be observed in the emergency department to ensure this does not relapse. Laboratory studies will be sent to evaluate possible cause of hypoglycemia and also her Addison's disease. She otherwise appears well in no acute distress. She was given 1 L of IV fluid.  Repeat blood sugar is elevated over 400. There is no anion gap. Patient is not in DKA. She appears comfortable in bed and in no acute distress. 1 L of fluid was given to her. Her insulin pump remains on. She is advised to see her primary care  physician early next week for close follow-up. Her vital signs remain within her normal limits and she is safe for discharge.  Angiocath insertion Performed by: Everlene Balls  Consent: Verbal consent obtained. Risks and benefits: risks, benefits and alternatives were discussed Time out: Immediately prior to procedure a "time out" was called to verify the correct patient, procedure, equipment, support staff and site/side marked as required.  Preparation: Patient was prepped and draped in the usual sterile fashion.  Vein Location: L brachial  Ultrasound Guided  Gauge: 20G  Normal blood return and flush without difficulty Patient tolerance: Patient tolerated the procedure well with no immediate complications.      Everlene Balls, MD 12/31/14 [phone]

## 2014-12-31 NOTE — ED Notes (Signed)
IV Team Nurse at bedside.  

## 2014-12-31 NOTE — ED Notes (Signed)
MD at bedside. 

## 2014-12-31 NOTE — Discharge Instructions (Signed)
Low Blood Sugar [name], Your blood work shows that you are not in DKA.  See your primary doctor within 3 days for close follow up.  Monitor your blood sugar closely so that you can catch it before it drops.  If symptoms worsen come back to the ED immediately. Thank you. Low blood sugar (hypoglycemia) means that the level of sugar in your blood is lower than it should be. Signs of low blood sugar include:  Getting sweaty.  Feeling hungry.  Feeling dizzy or weak.  Feeling sleepier than normal.  Feeling nervous.  Headaches.  Having a fast heartbeat. Low blood sugar can happen fast and can be an emergency. Your doctor can do tests to check your blood sugar level. You can have low blood sugar and not have diabetes. HOME CARE  Check your blood sugar as told by your doctor. If it is less than 70 mg/dl or as told by your doctor, take 1 of the following:  3 to 4 glucose tablets.   cup clear juice.   cup soda pop, not diet.  1 cup milk.  5 to 6 hard candies.  Recheck blood sugar after 15 minutes. Repeat until it is at the right level.  Eat a snack if it is more than 1 hour until the next meal.  Only take medicine as told by your doctor.  Do not skip meals. Eat on time.  Do not drink alcohol except with meals.  Check your blood glucose before driving.  Check your blood glucose before and after exercise.  Always carry treatment with you, such as glucose pills.  Always wear a medical alert bracelet if you have diabetes. GET HELP RIGHT AWAY IF:   Your blood glucose goes below 70 mg/dl or as told by your doctor, and you:  Are confused.  Are not able to swallow.  Pass out (faint).  You cannot treat yourself. You may need someone to help you.  You have low blood sugar problems often.  You have problems from your medicines.  You are not feeling better after 3 to 4 days.  You have vision changes. MAKE SURE YOU:   Understand these instructions.  Will watch  this condition.  Will get help right away if you are not doing well or get worse. Document Released: 10/16/2009 Document Revised: 10/14/2011 Document Reviewed: 10/16/2009 [facility] Patient Information 2015 [place], [place]. This information is not intended to replace advice given to you by your health care provider. Make sure you discuss any questions you have with your health care provider.  Blood Glucose Monitoring Monitoring your blood glucose (also know as blood sugar) helps you to manage your diabetes. It also helps you and your health care provider monitor your diabetes and determine how well your treatment plan is working. WHY SHOULD YOU MONITOR YOUR BLOOD GLUCOSE?  It can help you understand how food, exercise, and medicine affect your blood glucose.  It allows you to know what your blood glucose is at any given moment. You can quickly tell if you are having low blood glucose (hypoglycemia) or high blood glucose (hyperglycemia).  It can help you and your health care provider know how to adjust your medicines.  It can help you understand how to manage an illness or adjust medicine for exercise. WHEN SHOULD YOU TEST? Your health care provider will help you decide how often you should check your blood glucose. This may depend on the type of diabetes you have, your diabetes control, or the types  of medicines you are taking. Be sure to write down all of your blood glucose readings so that this information can be reviewed with your health care provider. See below for examples of testing times that your health care provider may suggest. Type 1 Diabetes  Test 4 times a day if you are in good control, using an insulin pump, or perform multiple daily injections.  If your diabetes is not well controlled or if you are sick, you may need to monitor more often.  It is a good idea to also monitor:  Before and after exercise.  Between meals and 2 hours after a meal.  Occasionally between 2:00  a.m. and 3:00 a.m. Type 2 Diabetes  It can vary with each person, but generally, if you are on insulin, test 4 times a day.  If you take medicines by mouth (orally), test 2 times a day.  If you are on a controlled diet, test once a day.  If your diabetes is not well controlled or if you are sick, you may need to monitor more often. HOW TO MONITOR YOUR BLOOD GLUCOSE Supplies Needed  Blood glucose meter.  Test strips for your meter. Each meter has its own strips. You must use the strips that go with your own meter.  A pricking needle (lancet).  A device that holds the lancet (lancing device).  A journal or log book to write down your results. Procedure  Wash your hands with soap and water. Alcohol is not preferred.  Prick the side of your finger (not the tip) with the lancet.  Gently milk the finger until a small drop of blood appears.  Follow the instructions that come with your meter for inserting the test strip, applying blood to the strip, and using your blood glucose meter. Other Areas to Get Blood for Testing Some meters allow you to use other areas of your body (other than your finger) to test your blood. These areas are called alternative sites. The most common alternative sites are:  The forearm.  The thigh.  The back area of the lower leg.  The palm of the hand. The blood flow in these areas is slower. Therefore, the blood glucose values you get may be delayed, and the numbers are different from what you would get from your fingers. Do not use alternative sites if you think you are having hypoglycemia. Your reading will not be accurate. Always use a finger if you are having hypoglycemia. Also, if you cannot feel your lows (hypoglycemia unawareness), always use your fingers for your blood glucose checks. ADDITIONAL TIPS FOR GLUCOSE MONITORING  Do not reuse lancets.  Always carry your supplies with you.  All blood glucose meters have a 24-hour "hotline" number to  call if you have questions or need help.  Adjust (calibrate) your blood glucose meter with a control solution after finishing a few boxes of strips. BLOOD GLUCOSE RECORD KEEPING It is a good idea to keep a daily record or log of your blood glucose readings. Most glucose meters, if not all, keep your glucose records stored in the meter. Some meters come with the ability to download your records to your home computer. Keeping a record of your blood glucose readings is especially helpful if you are wanting to look for patterns. Make notes to go along with the blood glucose readings because you might forget what happened at that exact time. Keeping good records helps you and your health care provider to work together to achieve  good diabetes management.  Document Released: 07/25/2003 Document Revised: 12/06/2013 Document Reviewed: 12/14/2012 [facility] Patient Information 2015 [place], [place]. This information is not intended to replace advice given to you by your health care provider. Make sure you discuss any questions you have with your health care provider.

## 2014-12-31 NOTE — ED Notes (Signed)
Informed the pt that a urine specimen is needed. 

## 2015-01-06 ENCOUNTER — Other Ambulatory Visit (HOSPITAL_COMMUNITY): Payer: Self-pay | Admitting: Psychiatry

## 2015-01-08 ENCOUNTER — Ambulatory Visit (INDEPENDENT_AMBULATORY_CARE_PROVIDER_SITE_OTHER): Payer: BLUE CROSS/BLUE SHIELD | Admitting: Emergency Medicine

## 2015-01-08 VITALS — BP 118/60 | HR 54 | Temp 98.6°F | Ht 62.25 in | Wt 117.1 lb

## 2015-01-08 DIAGNOSIS — L84 Corns and callosities: Secondary | ICD-10-CM

## 2015-01-08 NOTE — Progress Notes (Signed)
Subjective:  Patient ID: [name], female    DOB: Mar 30, 1981  Age: 34 y.o. MRN: [id number]  CC: Recurrent Skin Infections and Depression   HPI Antwanette Wesche presents   With pain in her foot. She said that she has had injury to her left foot which was associated with a piece of glass lacerating her foot at the  Head of the fifth metatarsal. She said that she remove the piece of glass but she's had a thickening of the skin on the plantar surface of her foot since. This area is rather tender. She filed with an abrasive compound with increased pain with no resolution of her symptoms. She denies any fever chills redness or drainage.  Outpatient Prescriptions Prior to Visit  Medication Sig Dispense Refill  . Biotin (BIOTIN MAXIMUM STRENGTH) 10 MG TABS Take 1 tablet by mouth daily.     . cholecalciferol (VITAMIN D) 1000 UNITS tablet Take 1,000 Units by mouth daily.    Marland Kitchen ERRIN 0.35 MG tablet TAKE 1 TABLET (0.35 MG TOTAL) BY MOUTH DAILY. 28 tablet 10  . fludrocortisone (FLORINEF) 0.1 MG tablet Take 0.05 mg by mouth daily.    Marland Kitchen gabapentin (NEURONTIN) 100 MG capsule Take 400 mg by mouth at bedtime. As needed for foot pain    . hydrocortisone (CORTEF) 10 MG tablet Take 10 mg by mouth 2 (two) times daily.     . Insulin Human (INSULIN PUMP) SOLN Inject 0.6 each into the skin 3 times daily with meals, bedtime and 2 AM.    . levothyroxine (SYNTHROID, LEVOTHROID) 50 MCG tablet Take 50 mcg by mouth daily before breakfast.    . norethindrone (MICRONOR,CAMILA,ERRIN) 0.35 MG tablet Take 1 tablet by mouth daily.    Marland Kitchen NOVOLOG 100 UNIT/ML injection Inject 0.6 Units into the skin. Insulin pump 0.6 units once an hour    . ondansetron (ZOFRAN ODT) 4 MG disintegrating tablet Take 1 tablet (4 mg total) by mouth every 8 (eight) hours as needed for nausea or vomiting. 20 tablet 0  . ramipril (ALTACE) 1.25 MG capsule Take 1.25 mg by mouth daily.    Marland Kitchen venlafaxine XR (EFFEXOR-XR) 150 MG 24 hr capsule Take 1 capsule (150 mg  total) by mouth daily with breakfast. 30 capsule 2  . vitamin B-12 (CYANOCOBALAMIN) 1000 MCG tablet Take 2,500 mcg by mouth daily.    Marland Kitchen lamoTRIgine (LAMICTAL) 100 MG tablet Take 1 tablet (100 mg total) by mouth daily. (Patient not taking: Reported on 01/08/2015) 30 tablet 0  . pramipexole (MIRAPEX) 0.25 MG tablet TAKE 2 TABLETS (0.5MG ) EACH NIGHT AFTER DINNER (Patient not taking: Reported on 12/31/2014) 60 tablet 2   No facility-administered medications prior to visit.    History   Social History  . Marital Status: Single    Spouse Name: n/a  . Number of Children: 0  . Years of Education: Master's   Occupational History  . 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 Smoking status: Never Smoker   . Smokeless tobacco: Never Used  . Alcohol Use: 0.0 - 1.2 oz/week    0-2 Standard drinks or equivalent per week     Comment: occasionally  . Drug Use: No  . Sexual Activity:    Partners: Male    Birth Control/ Protection: Condom, Pill   Other Topics Concern  . None   Social History Narrative   Lives alone.  Family lives in [place], [place].   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 Alzheimer's disease Maternal Grandmother   . Diabetes Paternal Grandmother   . Cancer Paternal Grandfather   . Hypertension Mother   . Hypertension Father     Past Medical History  Diagnosis Date  . Diabetic neuropathy     feet  . RLS (restless legs syndrome)   . History of seizure     x 1 - due to low blood sugar  . Heartburn     occasional  . Hypothyroidism   . Addison's disease   . Anxiety   . Mass of skin of left shoulder 07/2014    anterior shoulder mass  . Irregular periods   . Insulin dependent type 1 diabetes mellitus     Insulin pump  . DKA (diabetic ketoacidoses)     h     Review of Systems  Constitutional: Negative for fever, chills and appetite change.  HENT: Negative for  congestion, ear pain, postnasal drip, sinus pressure and sore throat.   Eyes: Negative for pain and redness.  Respiratory: Negative for cough, shortness of breath and wheezing.   Cardiovascular: Negative for leg swelling.  Gastrointestinal: Negative for nausea, vomiting, abdominal pain, diarrhea, constipation and blood in stool.  Endocrine: Negative for polyuria.  Genitourinary: Negative for dysuria, urgency, frequency and flank pain.  Musculoskeletal: Negative for gait problem.  Skin: Negative for rash.  Neurological: Negative for weakness and headaches.  Psychiatric/Behavioral: Negative for confusion and decreased concentration. The patient is not nervous/anxious.     Objective:  BP 118/60 mmHg  Pulse 54  Temp(Src) 98.6 F (37 C) (Oral)  Ht 5' 2.25" (1.581 m)  Wt 117 lb 2 oz (53.128 kg)  BMI 21.25 kg/m2  SpO2 96%  BP Readings from Last 3 Encounters:  01/08/15 118/60  12/31/14 110/76  11/10/14 119/82    Wt Readings from Last 3 Encounters:  01/08/15 117 lb 2 oz (53.128 kg)  11/10/14 109 lb (49.442 kg)  10/19/14 111 lb (50.349 kg)    Physical Exam  Constitutional: She is oriented to person, place, and time. She appears well-developed and well-nourished.  HENT:  Head: Normocephalic and atraumatic.  Eyes: Conjunctivae are normal. Pupils are equal, round, and reactive to light.  Pulmonary/Chest: Effort normal.  Musculoskeletal: She exhibits no edema.       Left foot: There is tenderness.  Neurological: She is alert and oriented to person, place, and time.  Skin: Skin is dry.  Psychiatric: She has a normal mood and affect. Her behavior is normal. Thought content normal.   she has tenderness of her foot on the left foot at the base of her small toe. On the plantar surface. There is no evidence of erythema or swelling or lymphangitis .  Lab Results  Component Value Date   WBC 8.8 12/31/2014   HGB 18.4* 12/31/2014   HCT 54.0* 12/31/2014   PLT 294 12/31/2014   GLUCOSE 478*  12/31/2014   CHOL 171 06/15/2012   TRIG 176* 06/15/2012   HDL 69 06/15/2012   LDLCALC 67 06/15/2012   ALT 30 12/31/2014   AST 42* 12/31/2014   NA 132* 12/31/2014   K 3.8 12/31/2014   CL 95* 12/31/2014   CREATININE 0.70 12/31/2014   BUN 18 12/31/2014   CO2 22 12/31/2014   TSH 2.186 10/11/2012   INR 1.23 01/05/2011   HGBA1C 11.9* 12/27/2013      .  Assessment & Plan:   [place]  was seen today for recurrent skin infections and depression.  Diagnoses and all orders for this visit:  Callus of foot   I am having Ms. Owens Shark maintain her fludrocortisone, gabapentin, levothyroxine, ramipril, hydrocortisone, norethindrone, NOVOLOG, cholecalciferol, Biotin, vitamin B-12, ondansetron, ERRIN, pramipexole, venlafaxine XR, lamoTRIgine, insulin pump, and cetirizine.  Meds ordered this encounter  Medications  . cetirizine (ZYRTEC) 10 MG tablet    Sig: Take 10 mg by mouth daily.    patient does have a  Appointment with her podiatrist this week. I suggested that she use some moleskin 2 had the area around the lesion and explained to her that we didn't routinely explore areas where there is  possible foreign body and she was in agreement with that plan.   Appropriate red flag conditions were discussed with the patient as well as actions that should be taken.  Patient expressed his understanding.  Follow-up: Return if symptoms worsen or fail to improve.  Roselee Culver, MD

## 2015-01-09 ENCOUNTER — Other Ambulatory Visit (HOSPITAL_COMMUNITY): Payer: Self-pay | Admitting: Psychiatry

## 2015-01-09 DIAGNOSIS — F331 Major depressive disorder, recurrent, moderate: Secondary | ICD-10-CM

## 2015-01-09 NOTE — Telephone Encounter (Signed)
A 10 day supply of patient's prescribed Effexor XR was ordered per protocol to last patient until evaluation rescheduled for 01/19/15 as patient no showed on 12/12/14 and was last seen 11/10/14.  Patient's Lamictal was not refilled as she informed urgent care on 01/08/15 she was not taking.

## 2015-01-19 ENCOUNTER — Ambulatory Visit (INDEPENDENT_AMBULATORY_CARE_PROVIDER_SITE_OTHER): Payer: BLUE CROSS/BLUE SHIELD | Admitting: Psychiatry

## 2015-01-19 ENCOUNTER — Encounter (HOSPITAL_COMMUNITY): Payer: Self-pay | Admitting: Psychiatry

## 2015-01-19 VITALS — BP 118/62 | HR 98 | Ht 62.0 in | Wt 117.8 lb

## 2015-01-19 DIAGNOSIS — F329 Major depressive disorder, single episode, unspecified: Secondary | ICD-10-CM

## 2015-01-19 DIAGNOSIS — F331 Major depressive disorder, recurrent, moderate: Secondary | ICD-10-CM

## 2015-01-19 MED ORDER — VENLAFAXINE HCL ER 150 MG PO CP24: 150.0000 mg | ORAL_CAPSULE | Freq: Every day | ORAL | Status: DC

## 2015-01-19 NOTE — Progress Notes (Signed)
[facility] [phone] Progress Note  [name] [id number] 34 y.o.  01/19/2015 4:22 PM  Chief Complaint: I stop taking Lamictal.  I think it was causing skin reaction.       History of Present Illness: [name] came for her appointment.  She is not taking Lamictal for past 3 weeks.  She has dermatitis and a skin infection and she felt it could be causing by Lamictal.  Since she stopped the Lamictal she has no more dermatitis.  She has not notice worsening of the depression and actually she is doing much better.  She wants to continue Effexor.  She is hoping to get a job .  She had a interview and she is very hopeful that she will able to get job in [place].  She sleeping good.  She denies any irritability, anger, mood swing.  She realizes if did not get the job she still applying for other places.  She wants to continue Effexor which is helping her anxiety.  Her blood sugar has been better.  Patient denies any feeling of hopelessness or worthlessness.  She denies any drinking or using any illegal substances.  Her vitals are stable.  Her appetite is okay.  Patient lives by herself however she will live with her parents in [place] if she gets job in [place].  Suicidal Ideation: No Plan Formed: No Patient has means to carry out plan: No  Homicidal Ideation: No Plan Formed: No Patient has means to carry out plan: No  Review of Systems  Skin: Negative for itching and rash.  Psychiatric/Behavioral: Negative for suicidal ideas and substance abuse. The patient does not have insomnia.     Psychiatric: Agitation: No Hallucination: No Depressed Mood: No Insomnia: No Hypersomnia: No Altered Concentration: No Feels Worthless: No Grandiose Ideas: No Belief In Special Powers: No New/Increased Substance Abuse: No Compulsions: No  Neurologic: Headache: No Seizure: No Paresthesias: No  Medical History:  Patient has insulin dependent diabetes, Addison disease and restless leg  syndrome.  She sees Dr. Posey Pronto at [facility] for the management of diabetes.  Outpatient Encounter Prescriptions as of 01/19/2015  Medication Sig  . Biotin (BIOTIN MAXIMUM STRENGTH) 10 MG TABS Take 1 tablet by mouth daily.   . cetirizine (ZYRTEC) 10 MG tablet Take 10 mg by mouth daily.  . cholecalciferol (VITAMIN D) 1000 UNITS tablet Take 1,000 Units by mouth daily.  Marland Kitchen ERRIN 0.35 MG tablet TAKE 1 TABLET (0.35 MG TOTAL) BY MOUTH DAILY.  . fludrocortisone (FLORINEF) 0.1 MG tablet Take 0.05 mg by mouth daily.  Marland Kitchen gabapentin (NEURONTIN) 100 MG capsule Take 400 mg by mouth at bedtime. As needed for foot pain  . hydrocortisone (CORTEF) 10 MG tablet Take 10 mg by mouth 2 (two) times daily.   . Insulin Human (INSULIN PUMP) SOLN Inject 0.6 each into the skin 3 times daily with meals, bedtime and 2 AM.  . levothyroxine (SYNTHROID, LEVOTHROID) 50 MCG tablet Take 50 mcg by mouth daily before breakfast.  . norethindrone (MICRONOR,CAMILA,ERRIN) 0.35 MG tablet Take 1 tablet by mouth daily.  Marland Kitchen NOVOLOG 100 UNIT/ML injection Inject 0.6 Units into the skin. Insulin pump 0.6 units once an hour  . ondansetron (ZOFRAN ODT) 4 MG disintegrating tablet Take 1 tablet (4 mg total) by mouth every 8 (eight) hours as needed for nausea or vomiting.  . pramipexole (MIRAPEX) 0.25 MG tablet TAKE 2 TABLETS (0.5MG) EACH NIGHT AFTER DINNER (Patient not taking: Reported on 12/31/2014)  . ramipril (ALTACE) 1.25 MG capsule Take 1.25  mg by mouth daily.  Marland Kitchen venlafaxine XR (EFFEXOR-XR) 150 MG 24 hr capsule Take 1 capsule (150 mg total) by mouth daily with breakfast.  . vitamin B-12 (CYANOCOBALAMIN) 1000 MCG tablet Take 2,500 mcg by mouth daily.  . [DISCONTINUED] lamoTRIgine (LAMICTAL) 100 MG tablet Take 1 tablet (100 mg total) by mouth daily. (Patient not taking: Reported on 01/08/2015)  . [DISCONTINUED] venlafaxine XR (EFFEXOR-XR) 150 MG 24 hr capsule TAKE 1 CAPSULE (150 MG TOTAL) BY MOUTH DAILY WITH BREAKFAST.   No facility-administered  encounter medications on file as of 01/19/2015.    Past Psychiatric History/Hospitalization(s): Patient has tried Zoloft, Wellbutrin and Celexa.  She felt the Zoloft stopped working , Wellbutrin caused tenderness and Celexa did not help. Anxiety: Yes Bipolar Disorder: No Depression: Yes Mania: No Psychosis: No Schizophrenia: No Personality Disorder: No Hospitalization for psychiatric illness: No History of Electroconvulsive Shock Therapy: No Prior Suicide Attempts: No  Physical Exam: Constitutional:  BP 118/62 mmHg  Pulse 98  Ht _0  (1.575 m)  Wt 117 lb 12.8 oz (53.434 kg)  BMI 21.54 kg/m2  Recent Results (from the past 2160 hour(s))  CBG monitoring, ED     Status: Abnormal   Collection Time: 12/31/14 12:09 AM  Result Value Ref Range   Glucose-Capillary 224 (H) 65 - 99 mg/dL  CBG monitoring, ED (now and then every hour for 3 hours)     Status: Abnormal   Collection Time: 12/31/14  1:15 AM  Result Value Ref Range   Glucose-Capillary 266 (H) 65 - 99 mg/dL  Urinalysis, Routine w reflex microscopic     Status: Abnormal   Collection Time: 12/31/14  2:20 AM  Result Value Ref Range   Color, Urine YELLOW YELLOW   APPearance CLEAR CLEAR   Specific Gravity, Urine 1.024 1.005 - 1.030   pH 6.5 5.0 - 8.0   Glucose, UA >1000 (A) NEGATIVE mg/dL   Hgb urine dipstick NEGATIVE NEGATIVE   Bilirubin Urine NEGATIVE NEGATIVE   Ketones, ur NEGATIVE NEGATIVE mg/dL   Protein, ur NEGATIVE NEGATIVE mg/dL   Urobilinogen, UA 1.0 0.0 - 1.0 mg/dL   Nitrite NEGATIVE NEGATIVE   Leukocytes, UA NEGATIVE NEGATIVE  Urine microscopic-add on     Status: None   Collection Time: 12/31/14  2:20 AM  Result Value Ref Range   Squamous Epithelial / LPF RARE RARE   WBC, UA 0-2 <3 WBC/hpf   RBC / HPF 0-2 <3 RBC/hpf  POC urine preg, ED (not at [facility])     Status: None   Collection Time: 12/31/14  2:25 AM  Result Value Ref Range   Preg Test, Ur NEGATIVE NEGATIVE    Comment:        THE SENSITIVITY OF  THIS METHODOLOGY IS >24 mIU/mL   Comprehensive metabolic panel     Status: Abnormal   Collection Time: 12/31/14  3:21 AM  Result Value Ref Range   Sodium 128 (L) 135 - 145 mmol/L   Potassium 3.8 3.5 - 5.1 mmol/L   Chloride 93 (L) 101 - 111 mmol/L   CO2 22 22 - 32 mmol/L   Glucose, Bld 468 (H) 65 - 99 mg/dL   BUN 17 6 - 20 mg/dL   Creatinine, Ser 0.68 0.44 - 1.00 mg/dL   Calcium 9.0 8.9 - 10.3 mg/dL   Total Protein 7.5 6.5 - 8.1 g/dL   Albumin 4.1 3.5 - 5.0 g/dL   AST 42 (H) 15 - 41 U/L   ALT 30 14 - 54 U/L  Alkaline Phosphatase 116 38 - 126 U/L   Total Bilirubin 0.7 0.3 - 1.2 mg/dL   GFR calc non Af Amer >60 >60 mL/min   GFR calc Af Amer >60 >60 mL/min    Comment: (NOTE) The eGFR has been calculated using the CKD EPI equation. This calculation has not been validated in all clinical situations. eGFR's persistently <60 mL/min signify possible Chronic Kidney Disease.    Anion gap 13 5 - 15  Lipase, blood     Status: Abnormal   Collection Time: 12/31/14  3:21 AM  Result Value Ref Range   Lipase 18 (L) 22 - 51 U/L  CBC     Status: Abnormal   Collection Time: 12/31/14  3:24 AM  Result Value Ref Range   WBC 8.8 4.0 - 10.5 K/uL   RBC 5.27 (H) 3.87 - 5.11 MIL/uL   Hemoglobin 16.9 (H) 12.0 - 15.0 g/dL   HCT 46.9 (H) 36.0 - 46.0 %   MCV 89.0 78.0 - 100.0 fL   MCH 32.1 26.0 - 34.0 pg   MCHC 36.0 30.0 - 36.0 g/dL   RDW 12.0 11.5 - 15.5 %   Platelets 294 150 - 400 K/uL  I-Stat Chem 8, ED  (not at [facility])     Status: Abnormal   Collection Time: 12/31/14  3:47 AM  Result Value Ref Range   Sodium 132 (L) 135 - 145 mmol/L   Potassium 3.8 3.5 - 5.1 mmol/L   Chloride 95 (L) 101 - 111 mmol/L   BUN 18 6 - 20 mg/dL   Creatinine, Ser 0.70 0.44 - 1.00 mg/dL   Glucose, Bld 478 (H) 65 - 99 mg/dL   Calcium, Ion 1.12 1.12 - 1.23 mmol/L   TCO2 20 0 - 100 mmol/L   Hemoglobin 18.4 (H) 12.0 - 15.0 g/dL   HCT 54.0 (H) 36.0 - 46.0 %   General Appearance: well  nourished  Musculoskeletal: Strength & Muscle Tone: within normal limits Gait & Station: normal Patient leans: N/A  Mental status examination Patient is casually dressed and well groomed.  She is pleasant and cooperative.  She maintained good eye contact.  Her speech is clear , coherent and fluent.  She denies any auditory or visual hallucination.  She describes her mood good and her affect is improved from the past.  Her thought process slow but logical and goal-directed. There were no delusions, paranoia or any obsessive thoughts.  Her attention and concentration is fair.  She denies any active or passive suicidal thoughts or homicidal thoughts.  Her psychomotor activity is good.  Her fund of knowledge is adequate.  She is alert and oriented x3.  Her insight judgment and impulse control is okay.  Established Problem, Stable/Improving (1), Review of Last Therapy Session (1), Review of Medication Regimen & Side Effects (2) and Review of New Medication or Change in Dosage (2)  Assessment: Axis I: Depressive disorder NOS  Axis II: Deferred  Axis III: See medical history  Plan:  I would discontinue Lamictal since patient is not taking it.  However recommended if symptoms started to get worse then she should call us immediately.  I will continue Effexor XR 150 mg daily.  She has no side effects.  If patient decided to move [place] and she will look for a local psychiatrist and she will inform us.  Otherwise I will see her in 3 months.  Encouraged to call us back if she has any question, concern or if she feels worsening  of the symptom.  Harl Wiechmann T., MD 01/19/2015

## 2015-01-30 ENCOUNTER — Other Ambulatory Visit: Payer: Self-pay

## 2015-02-02 ENCOUNTER — Ambulatory Visit (INDEPENDENT_AMBULATORY_CARE_PROVIDER_SITE_OTHER): Payer: BLUE CROSS/BLUE SHIELD | Admitting: Family Medicine

## 2015-02-02 VITALS — BP 116/62 | HR 117 | Temp 98.5°F | Resp 16 | Ht 62.0 in | Wt 115.1 lb

## 2015-02-02 DIAGNOSIS — L98499 Non-pressure chronic ulcer of skin of other sites with unspecified severity: Secondary | ICD-10-CM | POA: Diagnosis not present

## 2015-02-02 DIAGNOSIS — L089 Local infection of the skin and subcutaneous tissue, unspecified: Secondary | ICD-10-CM | POA: Diagnosis not present

## 2015-02-02 MED ORDER — MUPIROCIN 2 % EX OINT: 1.0000 "application " | TOPICAL_OINTMENT | Freq: Two times a day (BID) | CUTANEOUS | Status: AC

## 2015-02-02 NOTE — Progress Notes (Signed)
   Subjective:    Patient ID: [name], female    DOB: [birth year], 34 y.o.   MRN: [id number]  HPI Patient presents for wound evaluation. 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 Was scratching area last week and hemangioma came off. Since then area has been tender and worse if touched. Tenderness spreads to both breast. Redness has not spread. Denies fever, chills, or fatigue. Initially bled and had some drainage, but has resolved. Has used witch hazel, neosporin, and hydrocortisone. Med allergy to augmentin.    Review of Systems As noted above.    Objective:   Physical Exam  Constitutional: She is oriented to person, place, and time. She appears well-developed and well-nourished. No distress.  Blood pressure 116/62, pulse 117, temperature 98.5 F (36.9 C), temperature source Oral, resp. rate 16, height 5\' 2"  (1.575 m), weight 115 lb 2 oz (52.22 kg), last menstrual period 02/02/2015, SpO2 98 %.   HENT:  Head: Normocephalic and atraumatic.  Right Ear: External ear normal.  Left Ear: External ear normal.  Eyes: Conjunctivae are normal. Right eye exhibits no discharge. Left eye exhibits no discharge. No scleral icterus.  Pulmonary/Chest: Effort normal.  Neurological: She is alert and oriented to person, place, and time.  Skin: Skin is warm and dry. No rash noted. She is not diaphoretic. There is erythema.  1 x 1 cm ulceration with central papule. Erythematous border without any purulence. Tender to palpation.   Psychiatric: She has a normal mood and affect. Her behavior is normal. Judgment and thought content normal.      Assessment & Plan:  1. Skin inflammation 2. Skin ulceration, with unspecified severity - mupirocin ointment (BACTROBAN) 2 %; Apply 1 application topically 2 (two) times daily.  Dispense: 15 g; Refill: 0 - Wound culture   Alveta Heimlich PA-C  [facility] 02/02/2015 8:08  PM

## 2015-02-04 ENCOUNTER — Ambulatory Visit (INDEPENDENT_AMBULATORY_CARE_PROVIDER_SITE_OTHER): Payer: BLUE CROSS/BLUE SHIELD | Admitting: Family Medicine

## 2015-02-04 VITALS — BP 110/80 | HR 99 | Temp 98.2°F | Resp 18 | Ht 62.0 in | Wt 118.2 lb

## 2015-02-04 DIAGNOSIS — L98499 Non-pressure chronic ulcer of skin of other sites with unspecified severity: Secondary | ICD-10-CM | POA: Diagnosis not present

## 2015-02-04 NOTE — Patient Instructions (Signed)
Continue Bactroban ointment to the wound, keep clean and covered with soap and water cleansing at least once or twice per day. Follow-up with Dr. Everlene Farrier in 1 week. If any spread of redness, spread of ulcer, or worsening prior to that time, follow-up with an urgent care locally in [place]. If the area does not improve over the next week, we can always have a dermatologist look at this and this can be discussed with Dr. Everlene Farrier at follow-up.

## 2015-02-04 NOTE — Progress Notes (Signed)
Subjective:  This chart was scribed for Deanna Ray, MD by Thea Alken, ED Scribe. This patient was seen in room 3 and the patient's care was started at 10:46 AM.  Patient ID: [name], female    DOB: 01-10-1981, 34 y.o.   MRN: [id number]  HPI Chief Complaint  Patient presents with  . Follow-up    recheck chest injury   HPI Comments: [name] is a 34 y.o. female who presents to the [facility]  For a follow up. See note for [name] from 2 days ago. She is here for a f/u for wound and soreness to anterior chest wall. Suspected ulceration vs infectoion of wound bed without apparent significant cellulitis. Started on bactroban ointment. Wound culture was obtain which is currently still pending. Pt scratched a bump on her chest and states it began to bleed. Over the course of time she developed redness and tenderness to area which she was seen for 2 days ago. Since last visit she has kept area covered with ointment which she applies once a day. She has been taking aleve for slight tenderness which has improved since last visit. She washes area once a day. She denies fever, chills, breast tenderness and discharge from nipples.  Pt states her sugar have been doing well and is on omni pod.   Patient Active Problem List   Diagnosis Date Noted  . Snoring 05/19/2014  . 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 Depression with anxiety 12/27/2013  . DKA, type 1 12/26/2013  . Hyponatremia 12/26/2013  . Hyperkalemia 12/26/2013  . Thyroid disease   . Diabetic neuropathy   . 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 Hypersomnia   . Depression 10/11/2012  . PLMD (periodic limb movement disorder) 06/15/2012  . Addison disease 02/14/2012   Past Medical History  Diagnosis Date  . Diabetic neuropathy     feet  . RLS (restless legs syndrome)   . History of seizure     x 1 - due to low blood sugar  . Heartburn    occasional  . Hypothyroidism   . Addison's disease   . Anxiety   . Mass of skin of left shoulder 07/2014    anterior shoulder mass  . Irregular periods   . Insulin dependent type 1 diabetes mellitus     Insulin pump  . DKA (diabetic ketoacidoses)     h   Past Surgical History  Procedure Laterality Date  . Wisdom tooth extraction    . Mass excision Left 07/22/2014    Procedure: EXCISION LEFT ANTERIOR SHOULDER MASS;  Surgeon: Pedro Earls, MD;  Location: [facility];  Service: General;  Laterality: Left;   Allergies  Allergen Reactions  . Augmentin [Amoxicillin-Pot Clavulanate] Diarrhea  . Adhesive [Tape] Other (See Comments)    CONTACT DERMATITIS   Prior to Admission medications   Medication Sig Start Date End Date Taking? Authorizing Provider  Biotin (BIOTIN MAXIMUM STRENGTH) 10 MG TABS Take 1 tablet by mouth daily.    Yes Historical Provider, MD  cetirizine (ZYRTEC) 10 MG tablet Take 10 mg by mouth daily.   Yes Historical Provider, MD  cholecalciferol (VITAMIN D) 1000 UNITS tablet Take 1,000 Units by mouth daily.   Yes Historical Provider, MD  ERRIN 0.35 MG tablet TAKE 1 TABLET (0.35 MG TOTAL) BY MOUTH DAILY. 08/15/14  Yes Chelle Jeffery, PA-C  fludrocortisone (FLORINEF) 0.1 MG tablet Take 0.05 mg  by mouth daily.   Yes Historical Provider, MD  gabapentin (NEURONTIN) 100 MG capsule Take 400 mg by mouth at bedtime. As needed for foot pain   Yes Historical Provider, MD  hydrocortisone (CORTEF) 10 MG tablet Take 10 mg by mouth 2 (two) times daily.    Yes Historical Provider, MD  Insulin Human (INSULIN PUMP) SOLN Inject 0.6 each into the skin 3 times daily with meals, bedtime and 2 AM.   Yes Historical Provider, MD  lamoTRIgine (LAMICTAL) 100 MG tablet Take 100 mg by mouth daily. 11/10/14  Yes Historical Provider, MD  levothyroxine (SYNTHROID, LEVOTHROID) 50 MCG tablet Take 50 mcg by mouth daily before breakfast.   Yes Historical Provider, MD  mupirocin ointment  (BACTROBAN) 2 % Apply 1 application topically 2 (two) times daily. 02/02/15  Yes [name] R Brewington, PA-C  norethindrone (MICRONOR,CAMILA,ERRIN) 0.35 MG tablet Take 1 tablet by mouth daily.   Yes Historical Provider, MD  NOVOLOG 100 UNIT/ML injection Inject 0.6 Units into the skin. Insulin pump 0.6 units once an hour 04/29/14  Yes Historical Provider, MD  ondansetron (ZOFRAN ODT) 4 MG disintegrating tablet Take 1 tablet (4 mg total) by mouth every 8 (eight) hours as needed for nausea or vomiting. 07/26/14  Yes Bennett Scrape V, PA-C  ramipril (ALTACE) 1.25 MG capsule Take 1.25 mg by mouth daily.   Yes Historical Provider, MD  venlafaxine XR (EFFEXOR-XR) 150 MG 24 hr capsule Take 1 capsule (150 mg total) by mouth daily with breakfast. 01/19/15  Yes Kathlee Nations, MD  vitamin B-12 (CYANOCOBALAMIN) 1000 MCG tablet Take 2,500 mcg by mouth daily.   Yes Historical Provider, MD  pramipexole (MIRAPEX) 0.25 MG tablet TAKE 2 TABLETS (0.5MG ) EACH NIGHT AFTER DINNER Patient not taking: Reported on 12/31/2014 08/25/14   Kathee Delton, MD   History   Social History  . Marital Status: Single    Spouse Name: n/a  . Number of Children: 0  . Years of Education: Master's   Occupational History  . 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 Smoking status: Never Smoker   . Smokeless tobacco: Never Used  . Alcohol Use: 0.0 - 1.2 oz/week    0-2 Standard drinks or equivalent per week     Comment: occasionally  . Drug Use: No  . Sexual Activity:    Partners: Male    Birth Control/ Protection: Condom, Pill   Other Topics Concern  . Not on file   Social History Narrative   Lives alone.  Family lives in [place], [place].   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   Objective:   Physical Exam  Constitutional: She is oriented to person, place, and time. She appears well-developed and well-nourished. No  distress.  HENT:  Head: Normocephalic and atraumatic.  Eyes: Conjunctivae and EOM are normal.  Neck: Neck supple.  Cardiovascular: Normal rate.   Pulmonary/Chest: Effort normal.  Musculoskeletal: Normal range of motion.  Neurological: She is alert and oriented to person, place, and time.  Skin: Skin is warm and dry.  Very shallow/faint ulceration measuring 1 cm across over sternal area of chest with slight white appearance centrally. Slight peripheral erythema 1-2 mm. No surrounding erythema to breast or surrounding skin.   Psychiatric: She has a normal mood and affect. Her behavior is normal.  Nursing note and vitals reviewed.  Filed Vitals:   02/04/15 0957  BP: 110/80  Pulse: 99  Temp: 98.2 F (36.8 C)  TempSrc: Oral  Resp: 18  Height: 5\' 2"  (1.575 m)  Weight: 118 lb 4 oz (53.638 kg)  SpO2: 96%    Assessment & Plan:   [name] is a 34 y.o. female Skin ulcer, with unspecified severity  Superficial ulcer versus small area of infection. Overall appears improved improved, stable. Continue wound care with cleansing and covering with Bactroban ointment. She'll be out of town for the next week so can follow up with urgent care there locally if any worsening but plan on follow-up in 1 week Dr. Everlene Farrier. If persistent ulceration appearance, considering dermatology evaluation.  No orders of the defined types were placed in this encounter.   Patient Instructions  Continue Bactroban ointment to the wound, keep clean and covered with soap and water cleansing at least once or twice per day. Follow-up with Dr. Everlene Farrier in 1 week. If any spread of redness, spread of ulcer, or worsening prior to that time, follow-up with an urgent care locally in [place]. If the area does not improve over the next week, we can always have a dermatologist look at this and this can be discussed with Dr. Everlene Farrier at follow-up.    I personally performed the services described in this documentation, which was scribed in  my presence. The recorded information has been reviewed and considered, and addended by me as needed.

## 2015-02-06 LAB — WOUND CULTURE
GRAM STAIN: NONE SEEN
Gram Stain: NONE SEEN
Gram Stain: NONE SEEN

## 2015-02-08 ENCOUNTER — Telehealth: Payer: Self-pay | Admitting: Physician Assistant

## 2015-02-08 NOTE — Telephone Encounter (Signed)
Called to relay culture results. Acetinobacter found. If area on chest has improved no changes need to be made, however, if no improvement or worse will send cipro or bactrim to pharmacy.

## 2015-03-08 ENCOUNTER — Encounter: Payer: Self-pay | Admitting: *Deleted

## 2015-04-21 ENCOUNTER — Telehealth (HOSPITAL_COMMUNITY): Payer: Self-pay

## 2015-04-24 ENCOUNTER — Ambulatory Visit (HOSPITAL_COMMUNITY): Payer: Self-pay | Admitting: Psychiatry

## 2015-04-24 NOTE — Telephone Encounter (Signed)
Telephone call with patient to follow up on message left she had moved to [place].  Called patient who reported she was set up there and it was okay for Dr. Adele Schilder to close her case as will not be returning.

## 2015-04-30 ENCOUNTER — Other Ambulatory Visit (HOSPITAL_COMMUNITY): Payer: Self-pay | Admitting: Psychiatry

## 2015-05-01 ENCOUNTER — Other Ambulatory Visit (HOSPITAL_COMMUNITY): Payer: Self-pay | Admitting: Psychiatry

## 2015-05-01 ENCOUNTER — Telehealth (HOSPITAL_COMMUNITY): Payer: Self-pay

## 2015-05-01 DIAGNOSIS — F331 Major depressive disorder, recurrent, moderate: Secondary | ICD-10-CM

## 2015-05-01 MED ORDER — VENLAFAXINE HCL ER 150 MG PO CP24: 150.0000 mg | ORAL_CAPSULE | Freq: Every day | ORAL | Status: AC

## 2015-05-01 NOTE — Telephone Encounter (Signed)
Medication refill request- pt called requesting a one time refill for Effexor XR as has moved to [place] but cannot get in to see new provider until 05/23/15 and does not want to run out until sees new provider.  Pt. last seen 01/19/15. Canceled 04/24/15.  Patient would like a one time refill of Effexor XR called into [facility] in [place] to # [phone].

## 2015-05-01 NOTE — Progress Notes (Signed)
30 days supply given at [facility].

## 2015-07-28 ENCOUNTER — Telehealth: Payer: Self-pay | Admitting: Family Medicine

## 2015-07-28 NOTE — Telephone Encounter (Signed)
CALLED DR. AUTUMN JONES OFFICE AT [facility] TO REQUEST A COPY OF PATIENTS MOST RECENT LABS.  THEY WILL BE FAXING THEM TO [place].

## 2015-08-03 ENCOUNTER — Encounter: Payer: Self-pay | Admitting: Family Medicine

## 2015-08-25 ENCOUNTER — Encounter: Payer: Self-pay | Admitting: Family Medicine

## 2015-08-30 ENCOUNTER — Encounter: Payer: Self-pay | Admitting: Family Medicine
